# Patient Record
Sex: Male | Born: 1955 | Race: White | Hispanic: No | Marital: Married | Smoking: Never smoker
Health system: Southern US, Community
[De-identification: ages and names within clinical notes are randomized; demographics above are authoritative.]

## PROBLEM LIST (undated history)

## (undated) DIAGNOSIS — Z860100 Personal history of colon polyps, unspecified: Secondary | ICD-10-CM

## (undated) DIAGNOSIS — I10 Essential (primary) hypertension: Secondary | ICD-10-CM

## (undated) DIAGNOSIS — I4430 Unspecified atrioventricular block: Secondary | ICD-10-CM

## (undated) DIAGNOSIS — R001 Bradycardia, unspecified: Secondary | ICD-10-CM

## (undated) DIAGNOSIS — Z950 Presence of cardiac pacemaker: Secondary | ICD-10-CM

## (undated) DIAGNOSIS — N529 Male erectile dysfunction, unspecified: Secondary | ICD-10-CM

## (undated) DIAGNOSIS — E1169 Type 2 diabetes mellitus with other specified complication: Secondary | ICD-10-CM

## (undated) DIAGNOSIS — N410 Acute prostatitis: Secondary | ICD-10-CM

## (undated) DIAGNOSIS — T7840XA Allergy, unspecified, initial encounter: Secondary | ICD-10-CM

## (undated) DIAGNOSIS — E785 Hyperlipidemia, unspecified: Secondary | ICD-10-CM

## (undated) HISTORY — DX: Male erectile dysfunction, unspecified: N52.9

## (undated) HISTORY — DX: Acute prostatitis: N41.0

## (undated) HISTORY — DX: Hyperlipidemia, unspecified: E78.5

## (undated) HISTORY — DX: Bradycardia, unspecified: R00.1

## (undated) HISTORY — DX: Essential (primary) hypertension: I10

## (undated) HISTORY — DX: Allergy, unspecified, initial encounter: T78.40XA

## (undated) HISTORY — DX: Personal history of colonic polyps: Z86.010

## (undated) HISTORY — DX: Presence of cardiac pacemaker: Z95.0

## (undated) HISTORY — DX: Personal history of colon polyps, unspecified: Z86.0100

## (undated) HISTORY — DX: Unspecified atrioventricular block: I44.30

## (undated) HISTORY — PX: OTHER SURGICAL HISTORY: SHX169

---

## 2003-10-19 ENCOUNTER — Encounter: Admission: RE | Admit: 2003-10-19 | Discharge: 2003-11-02 | Payer: Self-pay | Admitting: Internal Medicine

## 2004-02-22 ENCOUNTER — Ambulatory Visit: Payer: Self-pay | Admitting: Internal Medicine

## 2004-06-20 ENCOUNTER — Ambulatory Visit: Payer: Self-pay | Admitting: Internal Medicine

## 2004-11-21 ENCOUNTER — Ambulatory Visit: Payer: Self-pay | Admitting: Internal Medicine

## 2005-05-23 ENCOUNTER — Ambulatory Visit: Payer: Self-pay | Admitting: Internal Medicine

## 2005-10-21 ENCOUNTER — Ambulatory Visit: Payer: Self-pay | Admitting: Internal Medicine

## 2006-02-17 ENCOUNTER — Ambulatory Visit: Payer: Self-pay | Admitting: Internal Medicine

## 2006-02-17 LAB — CONVERTED CEMR LAB: Hgb A1c MFr Bld: 6 % (ref 4.6–6.0)

## 2006-06-16 ENCOUNTER — Ambulatory Visit: Payer: Self-pay | Admitting: Internal Medicine

## 2006-11-07 DIAGNOSIS — J309 Allergic rhinitis, unspecified: Secondary | ICD-10-CM | POA: Insufficient documentation

## 2006-12-23 ENCOUNTER — Ambulatory Visit: Payer: Self-pay | Admitting: Internal Medicine

## 2006-12-23 DIAGNOSIS — E1169 Type 2 diabetes mellitus with other specified complication: Secondary | ICD-10-CM | POA: Insufficient documentation

## 2006-12-23 DIAGNOSIS — E119 Type 2 diabetes mellitus without complications: Secondary | ICD-10-CM | POA: Insufficient documentation

## 2006-12-23 DIAGNOSIS — I10 Essential (primary) hypertension: Secondary | ICD-10-CM | POA: Insufficient documentation

## 2006-12-23 DIAGNOSIS — I152 Hypertension secondary to endocrine disorders: Secondary | ICD-10-CM | POA: Insufficient documentation

## 2006-12-23 DIAGNOSIS — E1159 Type 2 diabetes mellitus with other circulatory complications: Secondary | ICD-10-CM | POA: Insufficient documentation

## 2006-12-23 LAB — CONVERTED CEMR LAB: Hgb A1c MFr Bld: 6.6 % — ABNORMAL HIGH

## 2007-04-21 ENCOUNTER — Ambulatory Visit: Payer: Self-pay | Admitting: Internal Medicine

## 2007-04-21 LAB — CONVERTED CEMR LAB: Hgb A1c MFr Bld: 6.8 % — ABNORMAL HIGH (ref 4.6–6.0)

## 2007-05-15 ENCOUNTER — Telehealth: Payer: Self-pay | Admitting: Internal Medicine

## 2007-08-17 ENCOUNTER — Ambulatory Visit: Payer: Self-pay | Admitting: Internal Medicine

## 2007-08-17 LAB — CONVERTED CEMR LAB
ALT: 18 units/L (ref 0–53)
AST: 16 units/L (ref 0–37)
Albumin: 3.8 g/dL (ref 3.5–5.2)
Alkaline Phosphatase: 38 units/L — ABNORMAL LOW (ref 39–117)
BUN: 25 mg/dL — ABNORMAL HIGH (ref 6–23)
Basophils Absolute: 0 10*3/uL (ref 0.0–0.1)
Basophils Relative: 0.2 % (ref 0.0–1.0)
Bilirubin Urine: NEGATIVE
Bilirubin, Direct: 0.1 mg/dL (ref 0.0–0.3)
CO2: 29 meq/L (ref 19–32)
Calcium: 9.2 mg/dL (ref 8.4–10.5)
Chloride: 107 meq/L (ref 96–112)
Cholesterol: 137 mg/dL (ref 0–200)
Creatinine, Ser: 1 mg/dL (ref 0.4–1.5)
Creatinine,U: 191.5 mg/dL
Eosinophils Absolute: 0.3 10*3/uL (ref 0.0–0.7)
Eosinophils Relative: 5.4 % — ABNORMAL HIGH (ref 0.0–5.0)
GFR calc Af Amer: 101 mL/min
GFR calc non Af Amer: 83 mL/min
Glucose, Bld: 138 mg/dL — ABNORMAL HIGH (ref 70–99)
Glucose, Urine, Semiquant: NEGATIVE
HCT: 39.7 % (ref 39.0–52.0)
HDL: 32 mg/dL — ABNORMAL LOW (ref >39.0)
Hemoglobin: 13.5 g/dL (ref 13.0–17.0)
Hgb A1c MFr Bld: 6.4 % — ABNORMAL HIGH (ref 4.6–6.0)
Ketones, urine, test strip: NEGATIVE
LDL Cholesterol: 94 mg/dL (ref 0–99)
Lymphocytes Relative: 25.3 % (ref 12.0–46.0)
MCHC: 33.9 g/dL (ref 30.0–36.0)
MCV: 92 fL (ref 78.0–100.0)
Microalb Creat Ratio: 7.8 mg/g (ref 0.0–30.0)
Microalb, Ur: 1.5 mg/dL (ref 0.0–1.9)
Monocytes Absolute: 0.4 10*3/uL (ref 0.1–1.0)
Monocytes Relative: 8.3 % (ref 3.0–12.0)
Neutro Abs: 3 10*3/uL (ref 1.4–7.7)
Neutrophils Relative %: 60.8 % (ref 43.0–77.0)
Nitrite: NEGATIVE
PSA: 0.3 ng/mL (ref 0.10–4.00)
Platelets: 252 10*3/uL (ref 150–400)
Potassium: 4.4 meq/L (ref 3.5–5.1)
Protein, U semiquant: NEGATIVE
RBC: 4.31 M/uL (ref 4.22–5.81)
RDW: 12.5 % (ref 11.5–14.6)
Sodium: 141 meq/L (ref 135–145)
Specific Gravity, Urine: 1.025
TSH: 1.36 microintl units/mL (ref 0.35–5.50)
Total Bilirubin: 0.8 mg/dL (ref 0.3–1.2)
Total CHOL/HDL Ratio: 4.3
Total Protein: 6.8 g/dL (ref 6.0–8.3)
Triglycerides: 53 mg/dL (ref 0–149)
Urobilinogen, UA: 0.2
VLDL: 11 mg/dL (ref 0–40)
WBC Urine, dipstick: NEGATIVE
WBC: 5 10*3/uL (ref 4.5–10.5)
pH: 5

## 2007-08-24 ENCOUNTER — Ambulatory Visit: Payer: Self-pay | Admitting: Internal Medicine

## 2007-09-10 ENCOUNTER — Encounter: Payer: Self-pay | Admitting: Internal Medicine

## 2007-10-12 ENCOUNTER — Ambulatory Visit: Payer: Self-pay | Admitting: Gastroenterology

## 2007-10-23 ENCOUNTER — Ambulatory Visit: Payer: Self-pay | Admitting: Gastroenterology

## 2007-10-23 ENCOUNTER — Encounter: Payer: Self-pay | Admitting: Gastroenterology

## 2007-10-23 LAB — HM COLONOSCOPY

## 2007-10-27 ENCOUNTER — Encounter: Payer: Self-pay | Admitting: Gastroenterology

## 2008-01-18 ENCOUNTER — Ambulatory Visit: Payer: Self-pay | Admitting: Internal Medicine

## 2008-01-18 DIAGNOSIS — Z86010 Personal history of colonic polyps: Secondary | ICD-10-CM | POA: Insufficient documentation

## 2008-01-18 LAB — CONVERTED CEMR LAB: Hgb A1c MFr Bld: 6.4 % — ABNORMAL HIGH (ref 4.6–6.0)

## 2008-08-19 ENCOUNTER — Ambulatory Visit: Payer: Self-pay | Admitting: Internal Medicine

## 2008-08-19 LAB — CONVERTED CEMR LAB
ALT: 17 units/L (ref 0–53)
AST: 15 units/L (ref 0–37)
Albumin: 3.9 g/dL (ref 3.5–5.2)
Alkaline Phosphatase: 42 units/L (ref 39–117)
BUN: 19 mg/dL (ref 6–23)
Basophils Absolute: 0 10*3/uL (ref 0.0–0.1)
Basophils Relative: 0.6 % (ref 0.0–3.0)
Bilirubin Urine: NEGATIVE
Bilirubin, Direct: 0.1 mg/dL (ref 0.0–0.3)
Blood in Urine, dipstick: NEGATIVE
CO2: 29 meq/L (ref 19–32)
Calcium: 9.2 mg/dL (ref 8.4–10.5)
Chloride: 109 meq/L (ref 96–112)
Cholesterol: 163 mg/dL (ref 0–200)
Creatinine, Ser: 1 mg/dL (ref 0.4–1.5)
Creatinine,U: 224.3 mg/dL
Eosinophils Absolute: 0.3 10*3/uL (ref 0.0–0.7)
Eosinophils Relative: 6.8 % — ABNORMAL HIGH (ref 0.0–5.0)
GFR calc non Af Amer: 83.03 mL/min (ref >60)
Glucose, Bld: 148 mg/dL — ABNORMAL HIGH (ref 70–99)
Glucose, Urine, Semiquant: NEGATIVE
HCT: 40.6 % (ref 39.0–52.0)
HDL: 41 mg/dL (ref >39.00)
Hemoglobin: 14.2 g/dL (ref 13.0–17.0)
Hgb A1c MFr Bld: 6.6 % — ABNORMAL HIGH (ref 4.6–6.5)
Ketones, urine, test strip: NEGATIVE
LDL Cholesterol: 111 mg/dL — ABNORMAL HIGH (ref 0–99)
Lymphocytes Relative: 21.6 % (ref 12.0–46.0)
Lymphs Abs: 1 10*3/uL (ref 0.7–4.0)
MCHC: 35 g/dL (ref 30.0–36.0)
MCV: 90 fL (ref 78.0–100.0)
Microalb Creat Ratio: 4.5 mg/g (ref 0.0–30.0)
Microalb, Ur: 1 mg/dL (ref 0.0–1.9)
Monocytes Absolute: 0.4 10*3/uL (ref 0.1–1.0)
Monocytes Relative: 8 % (ref 3.0–12.0)
Neutro Abs: 3.1 10*3/uL (ref 1.4–7.7)
Neutrophils Relative %: 63 % (ref 43.0–77.0)
Nitrite: NEGATIVE
PSA: 0.29 ng/mL (ref 0.10–4.00)
Platelets: 221 10*3/uL (ref 150.0–400.0)
Potassium: 4.2 meq/L (ref 3.5–5.1)
RBC: 4.51 M/uL (ref 4.22–5.81)
RDW: 12.5 % (ref 11.5–14.6)
Sodium: 144 meq/L (ref 135–145)
Specific Gravity, Urine: 1.025
TSH: 1.52 microintl units/mL (ref 0.35–5.50)
Total Bilirubin: 0.8 mg/dL (ref 0.3–1.2)
Total CHOL/HDL Ratio: 4
Total Protein: 6.7 g/dL (ref 6.0–8.3)
Triglycerides: 56 mg/dL (ref 0.0–149.0)
Urobilinogen, UA: 0.2
VLDL: 11.2 mg/dL (ref 0.0–40.0)
WBC Urine, dipstick: NEGATIVE
WBC: 4.8 10*3/uL (ref 4.5–10.5)
pH: 5.5

## 2008-08-25 ENCOUNTER — Ambulatory Visit: Payer: Self-pay | Admitting: Internal Medicine

## 2008-09-13 ENCOUNTER — Encounter: Payer: Self-pay | Admitting: Internal Medicine

## 2008-11-28 ENCOUNTER — Ambulatory Visit: Payer: Self-pay | Admitting: Internal Medicine

## 2008-11-28 DIAGNOSIS — H60339 Swimmer's ear, unspecified ear: Secondary | ICD-10-CM | POA: Insufficient documentation

## 2009-02-27 ENCOUNTER — Ambulatory Visit: Payer: Self-pay | Admitting: Internal Medicine

## 2009-02-27 LAB — CONVERTED CEMR LAB: Hgb A1c MFr Bld: 6.7 % — ABNORMAL HIGH (ref 4.6–6.5)

## 2009-08-28 ENCOUNTER — Ambulatory Visit: Payer: Self-pay | Admitting: Internal Medicine

## 2009-08-28 LAB — CONVERTED CEMR LAB: Hgb A1c MFr Bld: 6.7 % — ABNORMAL HIGH (ref 4.6–6.5)

## 2009-09-18 ENCOUNTER — Encounter: Payer: Self-pay | Admitting: Internal Medicine

## 2009-11-14 ENCOUNTER — Ambulatory Visit: Payer: Self-pay | Admitting: Internal Medicine

## 2009-11-14 DIAGNOSIS — A088 Other specified intestinal infections: Secondary | ICD-10-CM | POA: Insufficient documentation

## 2009-12-02 ENCOUNTER — Ambulatory Visit: Payer: Self-pay | Admitting: Internal Medicine

## 2009-12-02 DIAGNOSIS — N410 Acute prostatitis: Secondary | ICD-10-CM

## 2009-12-02 HISTORY — DX: Acute prostatitis: N41.0

## 2009-12-04 ENCOUNTER — Telehealth: Payer: Self-pay | Admitting: Internal Medicine

## 2009-12-04 ENCOUNTER — Ambulatory Visit: Payer: Self-pay | Admitting: Internal Medicine

## 2009-12-04 DIAGNOSIS — K625 Hemorrhage of anus and rectum: Secondary | ICD-10-CM | POA: Insufficient documentation

## 2009-12-04 LAB — CONVERTED CEMR LAB: Blood Glucose, Fingerstick: 235

## 2010-02-20 ENCOUNTER — Ambulatory Visit: Payer: Self-pay | Admitting: Internal Medicine

## 2010-02-20 LAB — CONVERTED CEMR LAB
ALT: 16 units/L (ref 0–53)
AST: 16 units/L (ref 0–37)
Albumin: 3.9 g/dL (ref 3.5–5.2)
Alkaline Phosphatase: 48 units/L (ref 39–117)
BUN: 23 mg/dL (ref 6–23)
Basophils Absolute: 0 10*3/uL (ref 0.0–0.1)
Basophils Relative: 0.5 % (ref 0.0–3.0)
Bilirubin Urine: NEGATIVE
Bilirubin, Direct: 0.1 mg/dL (ref 0.0–0.3)
Blood in Urine, dipstick: NEGATIVE
CO2: 28 meq/L (ref 19–32)
Calcium: 9.2 mg/dL (ref 8.4–10.5)
Chloride: 106 meq/L (ref 96–112)
Cholesterol: 165 mg/dL (ref 0–200)
Creatinine, Ser: 0.9 mg/dL (ref 0.4–1.5)
Creatinine,U: 206.5 mg/dL
Eosinophils Absolute: 0.1 10*3/uL (ref 0.0–0.7)
Eosinophils Relative: 2 % (ref 0.0–5.0)
GFR calc non Af Amer: 93.24 mL/min (ref >60)
Glucose, Bld: 180 mg/dL — ABNORMAL HIGH (ref 70–99)
Glucose, Urine, Semiquant: NEGATIVE
HCT: 41.8 % (ref 39.0–52.0)
HDL: 43.9 mg/dL (ref >39.00)
Hemoglobin: 14.3 g/dL (ref 13.0–17.0)
Hgb A1c MFr Bld: 7.2 % — ABNORMAL HIGH (ref 4.6–6.5)
Ketones, urine, test strip: NEGATIVE
LDL Cholesterol: 112 mg/dL — ABNORMAL HIGH (ref 0–99)
Lymphocytes Relative: 15.6 % (ref 12.0–46.0)
Lymphs Abs: 1 10*3/uL (ref 0.7–4.0)
MCHC: 34.3 g/dL (ref 30.0–36.0)
MCV: 89.9 fL (ref 78.0–100.0)
Microalb Creat Ratio: 0.9 mg/g (ref 0.0–30.0)
Microalb, Ur: 1.8 mg/dL (ref 0.0–1.9)
Monocytes Absolute: 0.5 10*3/uL (ref 0.1–1.0)
Monocytes Relative: 7.7 % (ref 3.0–12.0)
Neutro Abs: 4.9 10*3/uL (ref 1.4–7.7)
Neutrophils Relative %: 74.2 % (ref 43.0–77.0)
Nitrite: NEGATIVE
PSA: 0.35 ng/mL (ref 0.10–4.00)
Platelets: 225 10*3/uL (ref 150.0–400.0)
Potassium: 4.9 meq/L (ref 3.5–5.1)
Protein, U semiquant: NEGATIVE
RBC: 4.65 M/uL (ref 4.22–5.81)
RDW: 13.8 % (ref 11.5–14.6)
Sodium: 141 meq/L (ref 135–145)
Specific Gravity, Urine: >=1.03
TSH: 1.48 microintl units/mL (ref 0.35–5.50)
Total Bilirubin: 0.3 mg/dL (ref 0.3–1.2)
Total CHOL/HDL Ratio: 4
Total Protein: 6.4 g/dL (ref 6.0–8.3)
Triglycerides: 44 mg/dL (ref 0.0–149.0)
Urobilinogen, UA: 0.2
VLDL: 8.8 mg/dL (ref 0.0–40.0)
WBC Urine, dipstick: NEGATIVE
WBC: 6.6 10*3/uL (ref 4.5–10.5)
pH: 5

## 2010-02-27 ENCOUNTER — Telehealth: Payer: Self-pay | Admitting: Internal Medicine

## 2010-02-27 ENCOUNTER — Ambulatory Visit: Payer: Self-pay | Admitting: Internal Medicine

## 2010-02-27 LAB — HM DIABETES FOOT EXAM

## 2010-02-27 LAB — HM DIABETES EYE EXAM: HM Diabetic Eye Exam: NORMAL

## 2010-05-16 NOTE — Procedures (Signed)
Summary: Colonoscopy   Colonoscopy  Procedure date:  10/23/2007  Findings:      Location:  [facility].    Procedures Next Due Date:    Colonoscopy: 10/2012  Patient Name: Justin [name], Justin [name] MRN:  Procedure Procedures: Colonoscopy CPT: 506 552 0118.    with polypectomy. CPT: A3573898.  Personnel: Endoscopist: Rachael Fee, MD.  Referred By: Gordy Savers, MD.  Exam Location: Exam performed in Endoscopy Suite. Outpatient  Patient Consent: Procedure, Alternatives, Risks and Benefits discussed, consent obtained, from patient. Consent was obtained by the RN.  Indications  Average Risk Screening Routine.  History  Current Medications: Patient is not currently taking Coumadin.  Comments: Patient history reviewed and updated, pre-procedure physical performed prior to initiation of sedation? yes Pre-Exam Physical: Performed Oct 23, 2007. Cardio-pulmonary exam, Abdominal exam, Mental status exam WNL.  Exam Exam: Extent of exam reached: Cecum, extent intended: Cecum.  The cecum was identified by appendiceal orifice and IC valve. Patient position: on left side. Time to Cecum: 00:02:28. Time for Withdrawl: 00:12:53. Colon retroflexion performed. Images taken. ASA Classification: II. Tolerance: good.  Monitoring: Pulse and BP monitoring, Oximetry used. Supplemental O2 given.  Colon Prep Prep results: good.  Sedation Meds: Patient assessed and found to be appropriate for moderate (conscious) sedation. Fentanyl 50 mcg. given IV. Versed 7 mg. given IV.  Findings POLYP: Descending Colon, Maximum size: 4 mm. sessile polyp. Procedure:  snare without cautery, removed, retrieved, Polyp sent to pathology. Path # 1.  - NORMAL EXAM: Cecum to Rectum. Comments: OTHERWISE NORMAL EXAMINATION.   Assessment Abnormal examination, see findings above.  Comments: SINGLE SMALL COLON POLYP, NO CANCERS.  IF THE POLYP IS A PRE-CANCEROUS POLYP (ADENOMA), A  COLONOSCOPY SHOULD BE REPEATED IN 5 YEARS.  OTHERWISE THE PATIENT SHOULD CONTINUE TO FOLLOW CURRENT COLORECTAL CANCER SCREENING GUIDELINES WITH A REPEAT COLONOSCOPY IN 10 YEARS. Events  Unplanned Interventions: No intervention was required.  Unplanned Events: There were no complications.    cc.   Tori Milks   REPORT OF SURGICAL PATHOLOGY   Case #: [id number] Patient Name: Justin [name], Justin [name]. Office Chart Number:  [id number]   MRN: [id number] Pathologist: Orlandis Sanden. Luisa Hart, MD DOB/Age  24-Jun-1955 (Age: 55)    Gender: M Date Taken:  10/23/2007 Date Received: 10/23/2007   FINAL DIAGNOSIS   ***MICROSCOPIC EXAMINATION AND DIAGNOSIS***   COLON, DESCENDING POLYP:   - TUBULAR ADENOMA.  NO HIGH GRADE DYSPLASIA OR MALIGNANCY IDENTIFIED.   cc Date Reported:  10/26/2007     Beulah Gandy. Luisa Hart, MD *** Electronically Signed Out By JDP ***   Clinical information Screening, R/O adenoma (kv)   specimen(s) obtained Colon, polyp(s), descending   Gross Description Received in formalin is a tan, soft tissue fragment that is submitted in toto.  Size:  0.4 cm  (SP:mw, 10/23/07)    mw/     Signed by Rachael Fee MD on 10/27/2007 at 2:27 PM  ________________________________________________________________________ recall colonoscopy july, 2014   Signed by Rachael Fee MD on 10/27/2007 at 2:27 PM  ________________________________________________________________________    [facility] [address] [place], [place]  [address]    Dear Justin [name],   The polyp(s) that were removed during your recent procedure were proven to be adenomas.  These are pre-cancerous polyps that may have grown into cancers if they had not been removed.  Current colon polyp surveillance guidelines recommend that you have a repeat colonoscopy in 5 years.  We will therefore put your information in our reminder system  and will contact you in 5 years to schedule a repeat procedure.   Please call if you have any questions or concerns.      Sincerely,  Rachael Fee MD  This letter has been electronically signed by your physician.   Signed by Rachael Fee MD on 10/27/2007 at 2:29 PM   This report was created from the original endoscopy report, which was reviewed and signed by the above listed endoscopist.   ________________________________________________________________________

## 2010-05-16 NOTE — Assessment & Plan Note (Signed)
Summary: cpx/njr [facility] WITH PT FROM [facility]   Vital Signs:  Patient profile:   55 year old male Height:      72 inches Weight:      184 pounds BMI:     25.05 Pulse rate:   96 / minute Pulse rhythm:   regular BP sitting:   136 / 62  (left arm) Cuff size:   regular  Vitals Entered By: Raechel Ache, RN (Aug 25, 2008 2:23 PM)  History of Present Illness: 55 year old patient seen today for a wellness exam.  Medical problems  include type 2 diabetes, colonic polyps, and history of allergic rhinitis.  he has treated hypertension.  Clinically, he is doing well.  No concerns or complaints  Problems Prior to Update: 1)  Colonic Polyps, Hx of  (ICD-V12.72) 2)  Physical Examination  (ICD-V70.0) 3)  Diabetes Mellitus, Type II  (ICD-250.00) 4)  Hypertension  (ICD-401.9) 5)  Allergic Rhinitis  (ICD-477.9) 6)  Family History Diabetes 1st Degree Relative  (ICD-V18.0)  Medications Prior to Update: 1)  Viagra 50 Mg Tabs (Sildenafil Citrate) 2)  Metformin Hcl 1000 Mg Tabs (Metformin Hcl) .... Take 1 Tablet By Mouth Two Times A Day 3)  Cialis 20 Mg Tabs (Tadalafil) .... As Needed 4)  Benazepril-Hydrochlorothiazide 20-12.5 Mg  Tabs (Benazepril-Hydrochlorothiazide) .... One Daily 5)  Amlodipine Besylate 5 Mg  Tabs (Amlodipine Besylate) .... One Daily  Allergies: No Known Drug Allergies  Past History:  Past Medical History:    Reviewed history from 01/18/2008 and no changes required:    Allergic rhinitis    Hypertension    Diabetes mellitus, type II    Colonic polyps, hx of  Past Surgical History:    Reviewed history from 01/18/2008 and no changes required:    Denies surgical history    colonoscopy 2009  Family History:    Reviewed history from 04/21/2007 and no changes required:       Family History Hypertension       Family History of Stroke F 1st degree relative <55       Family History Diabetes 1st degree relative       father status post CABG at age 50; dementia        mother  status post stroke.  History hypertension              One brother history type 2 diabetes and hypertension       one sister is well- HTN  Social History:    Reviewed history from 08/24/2007 and no changes required:       Alcohol use-no       Drug use-no       Married        two twin  daughters with  CP; wheelchair bound  Review of Systems  The patient denies anorexia, fever, weight loss, weight gain, vision loss, decreased hearing, hoarseness, chest pain, syncope, dyspnea on exertion, peripheral edema, prolonged cough, headaches, hemoptysis, abdominal pain, melena, hematochezia, severe indigestion/heartburn, hematuria, incontinence, genital sores, muscle weakness, suspicious skin lesions, transient blindness, difficulty walking, depression, unusual weight change, abnormal bleeding, enlarged lymph nodes, angioedema, breast masses, and testicular masses.    Physical Exam  General:  mildly overweight.  Blood pressure 134/78 Head:  Normocephalic and atraumatic without obvious abnormalities. No apparent alopecia or balding. Eyes:  No corneal or conjunctival inflammation noted. EOMI. Perrla. Funduscopic exam benign, without hemorrhages, exudates or papilledema. Vision grossly normal. Ears:  External ear exam shows no significant lesions or  deformities.  Otoscopic examination reveals clear canals, tympanic membranes are intact bilaterally without bulging, retraction, inflammation or discharge. Hearing is grossly normal bilaterally.  Diabetes Management Exam:    Foot Exam (with socks and/or shoes not present):       Sensory-Pinprick/Light touch:          Left medial foot (L-4): normal          Left dorsal foot (L-5): normal          Left lateral foot (S-1): normal       Sensory-Monofilament:          Left foot: normal       Inspection:          Left foot: normal       Nails:          Left foot: normal          Right foot: normal    Eye Exam:       Eye Exam done here today           Results: normal   Impression & Recommendations:  Problem # 1:  Preventive Health Care (ICD-V70.0)  Complete Medication List: 1)  Viagra 50 Mg Tabs (Sildenafil citrate) 2)  Metformin Hcl 1000 Mg Tabs (Metformin hcl) .... Take 1 tablet by mouth two times a day 3)  Benazepril-hydrochlorothiazide 20-12.5 Mg Tabs (Benazepril-hydrochlorothiazide) .... One daily 4)  Amlodipine Besylate 5 Mg Tabs (Amlodipine besylate) .... One daily  Patient Instructions: 1)  Please schedule a follow-up appointment in 6 months. 2)  It is important that you exercise regularly at least 20 minutes 5 times a week. If you develop chest pain, have severe difficulty breathing, or feel very tired , stop exercising immediately and seek medical attention. 3)  You need to lose weight. Consider a lower calorie diet and regular exercise.  4)  Check your blood sugars regularly. If your readings are usually above : or below 70 you should contact our office. 5)  It is important that your Diabetic A1c level is checked every 3 months. 6)  See your eye doctor yearly to check for diabetic eye damage. 7)  Check your Blood Pressure regularly. If it is above: 130/80 you should make an appointment. Prescriptions: AMLODIPINE BESYLATE 5 MG  TABS (AMLODIPINE BESYLATE) one daily  #90 x 6   Entered and Authorized by:   Gordy Savers  MD   Signed by:   Gordy Savers  MD on 08/25/2008   Method used:   Print then Give to Patient   RxID:   [id number] BENAZEPRIL-HYDROCHLOROTHIAZIDE 20-12.5 MG  TABS (BENAZEPRIL-HYDROCHLOROTHIAZIDE) one daily  #90 x 6   Entered and Authorized by:   Gordy Savers  MD   Signed by:   Gordy Savers  MD on 08/25/2008   Method used:   Print then Give to Patient   RxID:   [id number] METFORMIN HCL 1000 MG TABS (METFORMIN HCL) Take 1 tablet by mouth two times a day  #180 x 6   Entered and Authorized by:   Gordy Savers  MD   Signed by:   Gordy Savers  MD on 08/25/2008    Method used:   Print then Give to Patient   RxID:   [id number] VIAGRA 50 MG TABS (SILDENAFIL CITRATE)   #12 x 6   Entered and Authorized by:   Gordy Savers  MD   Signed by:   Gordy Savers  MD on 08/25/2008  Method used:   Print then Give to Patient   RxID:   [id number]

## 2010-05-16 NOTE — Assessment & Plan Note (Signed)
Summary: cpx/jls   Vital Signs:  Patient Profile:   55 Years Old Male Weight:      183 pounds Temp:     97.8 degrees F oral BP sitting:   132 / 54  (left arm) Cuff size:   regular  Vitals Entered By: Raechel Ache, RN (Aug 24, 2007 2:09 PM)                 Chief Complaint:  CPX and labs done. FBS 145 today.Marland Kitchen  History of Present Illness: 55 year old patient seen today for and annual  exam.  He has a history of mild hypertension and type 2 diabetes.  No concerns or complaints    Current Allergies: No known allergies   Past Medical History:    Reviewed history from 04/21/2007 and no changes required:       Allergic rhinitis       Hypertension       Diabetes mellitus, type II  Past Surgical History:    Reviewed history from 12/23/2006 and no changes required:       Denies surgical history   Family History:    Reviewed history from 04/21/2007 and no changes required:       Family History Hypertension       Family History of Stroke F 1st degree relative <60       Family History Diabetes 1st degree relative       father status post CABG at age 72       mother status post stroke.  History hypertension              One brother history type 2 diabetes and hypertension       one sister is well  Social History:    Alcohol use-no    Drug use-no    Married     two twin  daughters with  CP; wheelchair bound    Review of Systems  The patient denies anorexia, fever, weight loss, weight gain, vision loss, decreased hearing, hoarseness, chest pain, syncope, dyspnea on exhertion, peripheral edema, prolonged cough, hemoptysis, abdominal pain, melena, hematochezia, severe indigestion/heartburn, hematuria, incontinence, genital sores, muscle weakness, suspicious skin lesions, transient blindness, difficulty walking, depression, unusual weight change, abnormal bleeding, enlarged lymph nodes, angioedema, breast masses, and testicular masses.     Physical Exam  General:  Well-developed,well-nourished,in no acute distress; alert,appropriate and cooperative throughout examination Head:     Normocephalic and atraumatic without obvious abnormalities. No apparent alopecia or balding. Eyes:     No corneal or conjunctival inflammation noted. EOMI. Perrla. Funduscopic exam benign, without hemorrhages, exudates or papilledema. Vision grossly normal. Ears:     External ear exam shows no significant lesions or deformities.  Otoscopic examination reveals clear canals, tympanic membranes are intact bilaterally without bulging, retraction, inflammation or discharge. Hearing is grossly normal bilaterally. Mouth:     Oral mucosa and oropharynx without lesions or exudates.  Teeth in good repair. Neck:     No deformities, masses, or tenderness noted. Chest Wall:     No deformities, masses, tenderness or gynecomastia noted. Lungs:     Normal respiratory effort, chest expands symmetrically. Lungs are clear to auscultation, no crackles or wheezes. Heart:     Normal rate and regular rhythm. S1 and S2 normal without gallop, murmur, click, rub or other extra sounds. Abdomen:     Bowel sounds positive,abdomen soft and non-tender without masses, organomegaly or hernias noted. Rectal:     No external abnormalities noted. Normal  sphincter tone. No rectal masses or tenderness. Genitalia:     Testes bilaterally descended without nodularity, tenderness or masses. No scrotal masses or lesions. No penis lesions or urethral discharge. Prostate:     Prostate gland firm and smooth, no enlargement, nodularity, tenderness, mass, asymmetry or induration. Msk:     No deformity or scoliosis noted of thoracic or lumbar spine.   Pulses:     R and L carotid,radial,femoral,dorsalis pedis and posterior tibial pulses are full and equal bilaterally Extremities:     No clubbing, cyanosis, edema, or deformity noted with normal full range of motion of all joints.   Neurologic:     No cranial nerve  deficits noted. Station and gait are normal. Plantar reflexes are down-going bilaterally. DTRs are symmetrical throughout. Sensory, motor and coordinative functions appear intact. Skin:     Intact without suspicious lesions or rashes Cervical Nodes:     No lymphadenopathy noted Axillary Nodes:     No palpable lymphadenopathy Inguinal Nodes:     No significant adenopathy Psych:     Cognition and judgment appear intact. Alert and cooperative with normal attention span and concentration. No apparent delusions, illusions, hallucinations    Impression & Recommendations:  Problem # 1:  DIABETES MELLITUS, TYPE II (ICD-250.00)  His updated medication list for this problem includes:    Metformin Hcl 1000 Mg Tabs (Metformin hcl) .Marland Kitchen... Take 1 tablet by mouth two times a day    Benazepril-hydrochlorothiazide 20-12.5 Mg Tabs (Benazepril-hydrochlorothiazide) ..... One daily   Problem # 2:  HYPERTENSION (ICD-401.9)  His updated medication list for this problem includes:    Benazepril-hydrochlorothiazide 20-12.5 Mg Tabs (Benazepril-hydrochlorothiazide) ..... One daily    Amlodipine Besylate 5 Mg Tabs (Amlodipine besylate) ..... One daily   Problem # 3:  ALLERGIC RHINITIS (ICD-477.9)  Problem # 4:  h/o hematuria  UA was repeated today that revealed an no blood.  The entire dipstick was negative  Complete Medication List: 1)  Viagra 50 Mg Tabs (Sildenafil citrate) 2)  Metformin Hcl 1000 Mg Tabs (Metformin hcl) .... Take 1 tablet by mouth two times a day 3)  Cialis 20 Mg Tabs (Tadalafil) .... As needed 4)  Benazepril-hydrochlorothiazide 20-12.5 Mg Tabs (Benazepril-hydrochlorothiazide) .... One daily 5)  Amlodipine Besylate 5 Mg Tabs (Amlodipine besylate) .... One daily  Other Orders: Gastroenterology Referral (GI)   Patient Instructions: 1)  Please schedule a follow-up appointment in 4 months. 2)  Limit your Sodium (Salt) to less than 2 grams a day(slightly less than 1/2 a teaspoon) to  prevent fluid retention, swelling, or worsening of symptoms. 3)  It is important that you exercise regularly at least 20 minutes 5 times a week. If you develop chest pain, have severe difficulty breathing, or feel very tired , stop exercising immediately and seek medical attention. 4)  Schedule a colonoscopy/sigmoidoscopy to help detect colon cancer. 5)  Check your blood sugars regularly. If your readings are usually above : or below 70 you should contact our office. 6)  It is important that your Diabetic A1c level is checked every 3 months. 7)  See your eye doctor yearly to check for diabetic eye damage.   Prescriptions: AMLODIPINE BESYLATE 5 MG  TABS (AMLODIPINE BESYLATE) one daily  #90 x 6   Entered and Authorized by:   Gordy Savers  MD   Signed by:   Gordy Savers  MD on 08/24/2007   Method used:   Print then Give to Patient   RxID:   [id number]  BENAZEPRIL-HYDROCHLOROTHIAZIDE 20-12.5 MG  TABS (BENAZEPRIL-HYDROCHLOROTHIAZIDE) one daily  #90 x 6   Entered and Authorized by:   Gordy Savers  MD   Signed by:   Gordy Savers  MD on 08/24/2007   Method used:   Print then Give to Patient   RxID:   [id number] METFORMIN HCL 1000 MG TABS (METFORMIN HCL) Take 1 tablet by mouth two times a day  #180 x 6   Entered and Authorized by:   Gordy Savers  MD   Signed by:   Gordy Savers  MD on 08/24/2007   Method used:   Print then Give to Patient   RxID:   [id number] CIALIS 20 MG TABS (TADALAFIL) as needed  #12 x 0   Entered and Authorized by:   Gordy Savers  MD   Signed by:   Gordy Savers  MD on 08/24/2007   Method used:   Print then Give to Patient   RxID:   [id number]  ]

## 2010-05-16 NOTE — Assessment & Plan Note (Signed)
Summary: M6A//[facility]   Vital Signs:  Patient Profile:   55 Years Old Male Weight:      188 pounds (85.45 kg) Temp:     98.0 degrees F (36.67 degrees C) oral BP sitting:   162 / 82  (left arm)  Vitals Entered By: Sindy Guadeloupe (December 23, 2006 1:15 PM)                 Chief Complaint:  f/u on diabetes bs 125 yesterday.  History of Present Illness: 55 year old patient.  Follow-up hypertension and diabetes, doing well.  Blood sugars remain under nice control.  He is on HCTZ for blood pressure control and more recently readings have been higher  Current Allergies: No known allergies   Past Medical History:    Allergic rhinitis    Hypertension  Past Surgical History:    Denies surgical history      Physical Exam  General:     Well-developed,well-nourished,in no acute distress; alert,appropriate and cooperative throughout examination for pressure 160/84 Head:     Normocephalic and atraumatic without obvious abnormalities. No apparent alopecia or balding. Eyes:     No corneal or conjunctival inflammation noted. EOMI. Perrla. Funduscopic exam benign, without hemorrhages, exudates or papilledema. Vision grossly normal. Mouth:     Oral mucosa and oropharynx without lesions or exudates.  Teeth in good repair. Neck:     No deformities, masses, or tenderness noted. Lungs:     Normal respiratory effort, chest expands symmetrically. Lungs are clear to auscultation, no crackles or wheezes. Heart:     Normal rate and regular rhythm. S1 and S2 normal without gallop, murmur, click, rub or other extra sounds. Abdomen:     Bowel sounds positive,abdomen soft and non-tender without masses, organomegaly or hernias noted.    Impression & Recommendations:  Problem # 1:  HYPERTENSION (ICD-401.9)  The following medications were removed from the medication list:    Hydrochlorothiazide 25 Mg Tabs (Hydrochlorothiazide) .Marland Kitchen... Take 1 tab by mouth every morning  His updated  medication list for this problem includes:    Benazepril-hydrochlorothiazide 20-12.5 Mg Tabs (Benazepril-hydrochlorothiazide) ..... One daily   Problem # 2:  DIABETES MELLITUS, TYPE II (ICD-250.00)  His updated medication list for this problem includes:    Metformin Hcl 1000 Mg Tabs (Metformin hcl) .Marland Kitchen... Take 1 tablet by mouth two times a day    Benazepril-hydrochlorothiazide 20-12.5 Mg Tabs (Benazepril-hydrochlorothiazide) ..... One daily  Orders: TLB-A1C / Hgb A1C (Glycohemoglobin) (83036-A1C) Venipuncture (91478)   Complete Medication List: 1)  Viagra 50 Mg Tabs (Sildenafil citrate) 2)  Metformin Hcl 1000 Mg Tabs (Metformin hcl) .... Take 1 tablet by mouth two times a day 3)  Cialis 20 Mg Tabs (Tadalafil) .... As needed 4)  Benazepril-hydrochlorothiazide 20-12.5 Mg Tabs (Benazepril-hydrochlorothiazide) .... One daily   Patient Instructions: 1)  Please schedule a follow-up appointment in 4 months. 2)  Limit your Sodium (Salt) to less than 4 grams a day (slightly less than 1 teaspoon) to prevent fluid retention, swelling, or worsening or symptoms. 3)  It is important that you exercise regularly at least 20 minutes 5 times a week. If you develop chest pain, have severe difficulty breathing, or feel very tired , stop exercising immediately and seek medical attention.    Prescriptions: CIALIS 20 MG TABS (TADALAFIL) as needed  #12 x 0   Entered and Authorized by:   Gordy Savers  MD   Signed by:   Gordy Savers  MD on 12/23/2006  Method used:   Print then Give to Patient   RxID:   [id number] BENAZEPRIL-HYDROCHLOROTHIAZIDE 20-12.5 MG  TABS (BENAZEPRIL-HYDROCHLOROTHIAZIDE) one daily  #90 x 6   Entered and Authorized by:   Gordy Savers  MD   Signed by:   Gordy Savers  MD on 12/23/2006   Method used:   Print then Give to Patient   RxID:   [id number] METFORMIN HCL 1000 MG TABS (METFORMIN HCL) Take 1 tablet by mouth two times a day  #180 x 6    Entered and Authorized by:   Gordy Savers  MD   Signed by:   Gordy Savers  MD on 12/23/2006   Method used:   Print then Give to Patient   RxID:   [id number]  ]

## 2010-05-16 NOTE — Assessment & Plan Note (Signed)
Summary: cpx/njr   Vital Signs:  Patient profile:   55 year old male Height:      71.5 inches Weight:      184 pounds Temp:     98.3 degrees F oral BP sitting:   120 / 70  (right arm) Cuff size:   regular  Vitals Entered By: Duard Brady LPN (February 27, 2010 2:00 PM) CC: cpx- doing well    BS 138 Is Patient Diabetic? Yes Did you bring your meter with you today? No   CC:  cpx- doing well    BS 138.  History of Present Illness: 75 -year-old patient who is seen today for a preventive health examination.  He has type 2 diabetes and treated hypertension.  He has a history of allergic rhinitis and colonic polyps.  He is doing quite well.  Preventive Screening-Counseling & Management  Alcohol-Tobacco     Smoking Status: never  Allergies (verified): No Known Drug Allergies  Past History:  Past Medical History: Reviewed history from 08/28/2009 and no changes required. Allergic rhinitis Hypertension Diabetes mellitus, type II Colonic polyps, hx of ED  Past Surgical History: Reviewed history from 01/18/2008 and no changes required. Denies surgical history colonoscopy 2009  Family History: Reviewed history from 02/27/2009 and no changes required. Family History Hypertension Family History of Stroke F 1st degree relative <60 Family History Diabetes 1st degree relative father status post CABG at age 51; dementia -died age 67 mother status post stroke.  History hypertension  One brother history type 2 diabetes and hypertension one sister is well- HTN  Social History: Reviewed history from 08/24/2007 and no changes required. Alcohol use-no Drug use-no Married  two twin  daughters with  CP; wheelchair boundSmoking Status:  never  Review of Systems  The patient denies anorexia, fever, weight loss, weight gain, vision loss, decreased hearing, hoarseness, chest pain, syncope, dyspnea on exertion, peripheral edema, prolonged cough, headaches, hemoptysis, abdominal  pain, melena, hematochezia, severe indigestion/heartburn, hematuria, incontinence, genital sores, muscle weakness, suspicious skin lesions, transient blindness, difficulty walking, depression, unusual weight change, abnormal bleeding, enlarged lymph nodes, angioedema, breast masses, and testicular masses.    Physical Exam  General:  Well-developed,well-nourished,in no acute distress; alert,appropriate and cooperative throughout examination Head:  Normocephalic and atraumatic without obvious abnormalities. No apparent alopecia or balding. Eyes:  No corneal or conjunctival inflammation noted. EOMI. Perrla. Funduscopic exam benign, without hemorrhages, exudates or papilledema. Vision grossly normal. Ears:  External ear exam shows no significant lesions or deformities.  Otoscopic examination reveals clear canals, tympanic membranes are intact bilaterally without bulging, retraction, inflammation or discharge. Hearing is grossly normal bilaterally. Nose:  External nasal examination shows no deformity or inflammation. Nasal mucosa are pink and moist without lesions or exudates. Mouth:  Oral mucosa and oropharynx without lesions or exudates.  Teeth in good repair. Neck:  No deformities, masses, or tenderness noted. Chest Wall:  No deformities, masses, tenderness or gynecomastia noted. Breasts:  No masses or gynecomastia noted Lungs:  Normal respiratory effort, chest expands symmetrically. Lungs are clear to auscultation, no crackles or wheezes. Heart:  Normal rate and regular rhythm. S1 and S2 normal without gallop, murmur, click, rub or other extra sounds. Abdomen:  Bowel sounds positive,abdomen soft and non-tender without masses, organomegaly or hernias noted. Genitalia:  Testes bilaterally descended without nodularity, tenderness or masses. No scrotal masses or lesions. No penis lesions or urethral discharge. Msk:  No deformity or scoliosis noted of thoracic or lumbar spine.   Pulses:  R and L  carotid,radial,femoral,dorsalis pedis and posterior tibial pulses are full and equal bilaterally Extremities:  No clubbing, cyanosis, edema, or deformity noted with normal full range of motion of all joints.   Neurologic:  No cranial nerve deficits noted. Station and gait are normal. Plantar reflexes are down-going bilaterally. DTRs are symmetrical throughout. Sensory, motor and coordinative functions appear intact. Skin:  Intact without suspicious lesions or rashes Cervical Nodes:  No lymphadenopathy noted Axillary Nodes:  No palpable lymphadenopathy Inguinal Nodes:  No significant adenopathy Psych:  Cognition and judgment appear intact. Alert and cooperative with normal attention span and concentration. No apparent delusions, illusions, hallucinations  Diabetes Management Exam:    Foot Exam (with socks and/or shoes not present):       Sensory-Pinprick/Light touch:          Left medial foot (L-4): normal          Left dorsal foot (L-5): normal          Left lateral foot (S-1): normal          Right medial foot (L-4): normal          Right dorsal foot (L-5): normal          Right lateral foot (S-1): normal       Sensory-Monofilament:          Left foot: normal          Right foot: normal       Inspection:          Left foot: normal          Right foot: normal       Nails:          Left foot: normal          Right foot: normal    Foot Exam by Podiatrist:       Date: 02/27/2010       Results: no diabetic findings       Done by: PCP    Eye Exam:       Eye Exam done here today          Results: normal   Impression & Recommendations:  Problem # 1:  PHYSICAL EXAMINATION (ICD-V70.0)  Complete Medication List: 1)  Metformin Hcl 1000 Mg Tabs (Metformin hcl) .... Take 1 tablet by mouth two times a day 2)  Benazepril-hydrochlorothiazide 20-12.5 Mg Tabs (Benazepril-hydrochlorothiazide) .... One daily 3)  Amlodipine Besylate 5 Mg Tabs (Amlodipine besylate) .... One daily 4)  Viagra 100  Mg Tabs (Sildenafil citrate) .... As directed 5)  Ciprofloxacin Hcl 500 Mg Tabs (Ciprofloxacin hcl) .Marland Kitchen.. 1 tab by mouth two times a day for prostate infection  Patient Instructions: 1)  Please schedule a follow-up appointment in 3 months. 2)  It is important that you exercise regularly at least 20 minutes 5 times a week. If you develop chest pain, have severe difficulty breathing, or feel very tired , stop exercising immediately and seek medical attention. 3)  Check your blood sugars regularly. If your readings are usually above : or below 70 you should contact our office. 4)  It is important that your Diabetic A1c level is checked every 3 months. Prescriptions: VIAGRA 100 MG TABS (SILDENAFIL CITRATE) as directed  #12 x 12   Entered and Authorized by:   Gordy Savers  MD   Signed by:   Gordy Savers  MD on 02/27/2010   Method used:   Electronically to  [facility]. [phone]* (retail)       [phone]       [place], [place]  [address]       Ph: [phone]       Fax: [phone]   RxID:   [id number] AMLODIPINE BESYLATE 5 MG  TABS (AMLODIPINE BESYLATE) one daily  #90 x 6   Entered and Authorized by:   Gordy Savers  MD   Signed by:   Gordy Savers  MD on 02/27/2010   Method used:   Electronically to        [facility]. [phone]* (retail)       [phone] [address]       [place], [place]  [address]       Ph: [phone]       Fax: [phone]   RxID:   [id number] BENAZEPRIL-HYDROCHLOROTHIAZIDE 20-12.5 MG  TABS (BENAZEPRIL-HYDROCHLOROTHIAZIDE) one daily  #90 x 6   Entered and Authorized by:   Gordy Savers  MD   Signed by:   Gordy Savers  MD on 02/27/2010   Method used:   Electronically to        [facility]. [phone]* (retail)       [phone] [address]       [place], [place]  [address]       Ph: [phone]       Fax:  [phone]   RxID:   [id number] METFORMIN HCL 1000 MG TABS (METFORMIN HCL) Take 1 tablet by mouth two times a day  #180 x 6   Entered and Authorized by:   Gordy Savers  MD   Signed by:   Gordy Savers  MD on 02/27/2010   Method used:   Electronically to        [facility]. [phone]* (retail)       [phone] [address]       [place], [place]  [address]       Ph: [phone]       Fax: [phone]   RxID:   [id number]    Orders Added: 1)  Est. Patient 40-64 years [33825]   Immunization History:  Influenza Immunization History:    Influenza:  Historical (02/13/2010)   Immunization History:  Influenza Immunization History:    Influenza:  Historical (02/13/2010)  given at work per pt . Earlean Polka

## 2010-05-16 NOTE — Assessment & Plan Note (Signed)
Summary: 6 month rov/njr   Vital Signs:  [name] profile:   55 year old male Weight:      183 pounds Temp:     97.9 degrees F oral BP sitting:   140 / 74  (left arm) Cuff size:   regular  Vitals Entered By: Duard Brady LPN (Aug 28, 2009 1:Justin PM) CC: 6 mos rov - doing well       bs170 (had eaten 2 hours before) Is [name] Diabetic? Yes Did you bring your meter with you today? No   CC:  6 mos rov - doing well       bs170 (had eaten 2 hours before).  History of Present Illness: Justin [name] who is in today for follow-up of his type 2 diabetes.  History of hypertension.  He has ED which has not been well controlled on Viagra 50.  He has a resolving cough for the past 3 weeks that he feels is allergy related and improving.  Blood pressure medication includes an ACE inhibitor.  Allergies (verified): No Known Drug Allergies  Past History:  Past Medical History: Allergic rhinitis Hypertension Diabetes mellitus, type II Colonic polyps, hx of ED  Family History: Reviewed history from 02/27/2009 and no changes required. Family History Hypertension Family History of Stroke F 1st degree relative <60 Family History Diabetes 1st degree relative father status post CABG at age 66; dementia -died age 78 mother status post stroke.  History hypertension  One brother history type 2 diabetes and hypertension one sister is well- HTN  Review of Systems       The [name] complains of suspicious skin lesions.  The [name] denies anorexia, fever, weight loss, weight gain, vision loss, decreased hearing, hoarseness, chest pain, syncope, dyspnea on exertion, peripheral edema, prolonged cough, headaches, hemoptysis, abdominal pain, melena, hematochezia, severe indigestion/heartburn, hematuria, incontinence, genital sores, muscle weakness, transient blindness, difficulty walking, depression, unusual weight change, abnormal bleeding, enlarged lymph nodes, angioedema, breast masses, and  testicular masses.    Physical Exam  General:  Well-developed,well-nourished,in no acute distress; alert,appropriate and cooperative throughout examination; of pressure 130/66 Head:  Normocephalic and atraumatic without obvious abnormalities. No apparent alopecia or balding. Eyes:  No corneal or conjunctival inflammation noted. EOMI. Perrla. Funduscopic exam benign, without hemorrhages, exudates or papilledema. Vision grossly normal. Ears:  External ear exam shows no significant lesions or deformities.  Otoscopic examination reveals clear canals, tympanic membranes are intact bilaterally without bulging, retraction, inflammation or discharge. Hearing is grossly normal bilaterally. Mouth:  Oral mucosa and oropharynx without lesions or exudates.  Teeth in good repair. Neck:  No deformities, masses, or tenderness noted. Lungs:  Normal respiratory effort, chest expands symmetrically. Lungs are clear to auscultation, no crackles or wheezes. Heart:  Normal rate and regular rhythm. S1 and S2 normal without gallop, murmur, click, rub or other extra sounds. Abdomen:  Bowel sounds positive,abdomen soft and non-tender without masses, organomegaly or hernias noted. Msk:  No deformity or scoliosis noted of thoracic or lumbar spine.   Pulses:  R and L carotid,radial,femoral,dorsalis pedis and posterior tibial pulses are full and equal bilaterally  Diabetes Management Exam:    Eye Exam:       Eye Exam done here today          Results: normal   Impression & Recommendations:  Problem # 1:  DIABETES MELLITUS, TYPE II (ICD-250.00)  His updated medication list for this problem includes:    Metformin Hcl 1000 Mg Tabs (Metformin hcl) .Marland Kitchen... Take 1 tablet  by mouth two times a day    Benazepril-hydrochlorothiazide 20-12.5 Mg Tabs (Benazepril-hydrochlorothiazide) ..... One daily    His updated medication list for this problem includes:    Metformin Hcl 1000 Mg Tabs (Metformin hcl) .Marland Kitchen... Take 1 tablet by mouth  two times a day    Benazepril-hydrochlorothiazide 20-12.5 Mg Tabs (Benazepril-hydrochlorothiazide) ..... One daily  Problem # 2:  HYPERTENSION (ICD-401.9)  His updated medication list for this problem includes:    Benazepril-hydrochlorothiazide 20-12.5 Mg Tabs (Benazepril-hydrochlorothiazide) ..... One daily    Amlodipine Besylate 5 Mg Tabs (Amlodipine besylate) ..... One daily  His updated medication list for this problem includes:    Benazepril-hydrochlorothiazide 20-12.5 Mg Tabs (Benazepril-hydrochlorothiazide) ..... One daily    Amlodipine Besylate 5 Mg Tabs (Amlodipine besylate) ..... One daily  Complete Medication List: 1)  Metformin Hcl 1000 Mg Tabs (Metformin hcl) .... Take 1 tablet by mouth two times a day 2)  Benazepril-hydrochlorothiazide 20-12.5 Mg Tabs (Benazepril-hydrochlorothiazide) .... One daily 3)  Amlodipine Besylate 5 Mg Tabs (Amlodipine besylate) .... One daily 4)  Viagra 100 Mg Tabs (Sildenafil citrate) .... As directed  Other Orders: Venipuncture (16[address]) TLB-A1C / Hgb A1C (Glycohemoglobin) (83036-A1C)  [name] Instructions: 1)  Please schedule a follow-up appointment in 6 months for CPX  2)  Limit your Sodium (Salt) to less than 2 grams a day(slightly less than 1/2 a teaspoon) to prevent fluid retention, swelling, or worsening of symptoms. 3)  It is important that you exercise regularly at least 20 minutes 5 times a week. If you develop chest pain, have severe difficulty breathing, or feel very tired , stop exercising immediately and seek medical attention. 4)  Check your blood sugars regularly. If your readings are usually above : or below 70 you should contact our office. 5)  It is important that your Diabetic A1c level is checked every 3 months. 6)  See your eye doctor yearly to check for diabetic eye damage. Prescriptions: VIAGRA 100 MG TABS (SILDENAFIL CITRATE) as directed  #12 x 12   Entered and Authorized by:   Gordy Savers  MD   Signed by:    Gordy Savers  MD on 08/28/2009   Method used:   Electronically to        [facility]* (mail-order)             ,          Ph: [phone]       Fax: [phone]   RxID:   [id number] AMLODIPINE BESYLATE 5 MG  TABS (AMLODIPINE BESYLATE) one daily  #90 x 6   Entered and Authorized by:   Gordy Savers  MD   Signed by:   Gordy Savers  MD on 08/28/2009   Method used:   Electronically to        [facility]* (mail-order)             ,          Ph: [phone]       Fax: [phone]   RxID:   [id number] BENAZEPRIL-HYDROCHLOROTHIAZIDE 20-12.5 MG  TABS (BENAZEPRIL-HYDROCHLOROTHIAZIDE) one daily  #90 x 6   Entered and Authorized by:   Gordy Savers  MD   Signed by:   Gordy Savers  MD on 08/28/2009   Method used:   Electronically to        [facility]* (mail-order)             ,  Ph: [phone]       Fax: [phone]   RxID:   [id number] METFORMIN HCL 1000 MG TABS (METFORMIN HCL) Take 1 tablet by mouth two times a day  #180 x 6   Entered and Authorized by:   Gordy Savers  MD   Signed by:   Gordy Savers  MD on 08/28/2009   Method used:   Electronically to        [facility]* (mail-order)             ,          Ph: [phone]       Fax: [phone]   RxID:   [id number]

## 2010-05-16 NOTE — Progress Notes (Signed)
Summary: hgbaic  Phone Note Call from Patient   Caller: Patient Call For: Dr. Kirtland Bouchard Summary of Call: Would like HgbAic results.  [phone] Initial call taken by: Lynann Beaver CMA,  May 15, 2007 4:36 PM    6.8 gm %  please call-at goal of < 7     Appended Document: hgbaic pt notified.

## 2010-05-16 NOTE — Letter (Signed)
Summary: [facility]  [facility]   Imported By: Maryln Gottron 09/27/2008 09:25:42  _____________________________________________________________________  External Attachment:    Type:   Image     Comment:   External Document

## 2010-05-16 NOTE — Assessment & Plan Note (Signed)
Summary: 6 mo rov/mm   Vital Signs:  Patient profile:   55 year old male Weight:      182 pounds BP sitting:   130 / 64  (left arm) Cuff size:   regular  Vitals Entered By: Raechel Ache, RN (February 27, 2009 2:03 PM) CC: 6 mo f/u.   CC:  6 mo f/u.Marland Kitchen  History of Present Illness: a 55 year old patient who is seen today for follow-up of his hypertensionand type 2 diabetes he is doing quite well.  Blood sugars remain under minus control.  No concerns or complaints.  He has had a difficult year was in both parents this past summer and fall  Allergies: No Known Drug Allergies  Past History:  Past Medical History: Reviewed history from 01/18/2008 and no changes required. Allergic rhinitis Hypertension Diabetes mellitus, type II Colonic polyps, hx of  Family History: Reviewed history from 08/25/2008 and no changes required. Family History Hypertension Family History of Stroke F 1st degree relative <60 Family History Diabetes 1st degree relative father status post CABG at age 28; dementia -died age 95 mother status post stroke.  History hypertension  One brother history type 2 diabetes and hypertension one sister is well- HTN  Social History: Reviewed history from 08/24/2007 and no changes required. Alcohol use-no Drug use-no Married  two twin  daughters with  CP; wheelchair bound  Review of Systems  The patient denies anorexia, fever, weight loss, weight gain, vision loss, decreased hearing, hoarseness, chest pain, syncope, dyspnea on exertion, peripheral edema, prolonged cough, headaches, hemoptysis, abdominal pain, melena, hematochezia, severe indigestion/heartburn, hematuria, incontinence, genital sores, muscle weakness, suspicious skin lesions, transient blindness, difficulty walking, depression, unusual weight change, abnormal bleeding, enlarged lymph nodes, angioedema, breast masses, and testicular masses.    Physical Exam  General:   Well-developed,well-nourished,in no acute distress; alert,appropriate and cooperative throughout examination; the pressure 120/64 Head:  Normocephalic and atraumatic without obvious abnormalities. No apparent alopecia or balding. Mouth:  Oral mucosa and oropharynx without lesions or exudates.  Teeth in good repair. Neck:  No deformities, masses, or tenderness noted. Lungs:  Normal respiratory effort, chest expands symmetrically. Lungs are clear to auscultation, no crackles or wheezes. Heart:  Normal rate and regular rhythm. S1 and S2 normal without gallop, murmur, click, rub or other extra sounds. Abdomen:  Bowel sounds positive,abdomen soft and non-tender without masses, organomegaly or hernias noted. Msk:  No deformity or scoliosis noted of thoracic or lumbar spine.   Pulses:  R and L carotid,radial,femoral,dorsalis pedis and posterior tibial pulses are full and equal bilaterally Extremities:  No clubbing, cyanosis, edema, or deformity noted with normal full range of motion of all joints.     Impression & Recommendations:  Problem # 1:  DIABETES MELLITUS, TYPE II (ICD-250.00)  His updated medication list for this problem includes:    Metformin Hcl 1000 Mg Tabs (Metformin hcl) .Marland Kitchen... Take 1 tablet by mouth two times a day    Benazepril-hydrochlorothiazide 20-12.5 Mg Tabs (Benazepril-hydrochlorothiazide) ..... One daily  Orders: Venipuncture (60454) TLB-A1C / Hgb A1C (Glycohemoglobin) (83036-A1C)  Problem # 2:  HYPERTENSION (ICD-401.9)  His updated medication list for this problem includes:    Benazepril-hydrochlorothiazide 20-12.5 Mg Tabs (Benazepril-hydrochlorothiazide) ..... One daily    Amlodipine Besylate 5 Mg Tabs (Amlodipine besylate) ..... One daily  His updated medication list for this problem includes:    Benazepril-hydrochlorothiazide 20-12.5 Mg Tabs (Benazepril-hydrochlorothiazide) ..... One daily    Amlodipine Besylate 5 Mg Tabs (Amlodipine besylate) ..... One  daily  Complete  Medication List: 1)  Viagra 50 Mg Tabs (Sildenafil citrate) 2)  Metformin Hcl 1000 Mg Tabs (Metformin hcl) .... Take 1 tablet by mouth two times a day 3)  Benazepril-hydrochlorothiazide 20-12.5 Mg Tabs (Benazepril-hydrochlorothiazide) .... One daily 4)  Amlodipine Besylate 5 Mg Tabs (Amlodipine besylate) .... One daily 5)  Neomycin-polymyxin-hc 3.5-10000-1 Soln (Neomycin-polymyxin-hc) .... 3 drops to the left ear 4 times daily  Patient Instructions: 1)  Please schedule a follow-up appointment in 6 months. 2)  Limit your Sodium (Salt). 3)  It is important that you exercise regularly at least 20 minutes 5 times a week. If you develop chest pain, have severe difficulty breathing, or feel very tired , stop exercising immediately and seek medical attention. 4)  Check your blood sugars regularly. If your readings are usually above : or below 70 you should contact our office. 5)  It is important that your Diabetic A1c level is checked every 3 months. 6)  See your eye doctor yearly to check for diabetic eye damage. Prescriptions: AMLODIPINE BESYLATE 5 MG  TABS (AMLODIPINE BESYLATE) one daily  #90 x 6   Entered and Authorized by:   Gordy Savers  MD   Signed by:   Gordy Savers  MD on 02/27/2009   Method used:   Print then Give to Patient   RxID:   [id number] BENAZEPRIL-HYDROCHLOROTHIAZIDE 20-12.5 MG  TABS (BENAZEPRIL-HYDROCHLOROTHIAZIDE) one daily  #90 x 6   Entered and Authorized by:   Gordy Savers  MD   Signed by:   Gordy Savers  MD on 02/27/2009   Method used:   Print then Give to Patient   RxID:   [id number] METFORMIN HCL 1000 MG TABS (METFORMIN HCL) Take 1 tablet by mouth two times a day  #180 x 6   Entered and Authorized by:   Gordy Savers  MD   Signed by:   Gordy Savers  MD on 02/27/2009   Method used:   Print then Give to Patient   RxID:   [id number] VIAGRA 50 MG TABS (SILDENAFIL CITRATE)   #12 x 6   Entered  and Authorized by:   Gordy Savers  MD   Signed by:   Gordy Savers  MD on 02/27/2009   Method used:   Print then Give to Patient   RxID:   [id number]

## 2010-05-16 NOTE — Assessment & Plan Note (Signed)
Summary: RECTAL BLEEDING OK PER KIM/NJR   Vital Signs:  Patient profile:   55 year old male Weight:      182 pounds Temp:     98.2 degrees F oral BP sitting:   140 / 70  (left arm) Cuff size:   regular  Vitals Entered By: Kathrynn Speed CMA (December 04, 2009 2:35 PM) CC: Rectal bleeding, infected prostate, x 2 days, src Is Patient Diabetic? Yes CBG Result 235   CC:  Rectal bleeding, infected prostate, x 2 days, and src.  History of Present Illness:  55 year old patient who was seen in Saturday clinic and treated for acute prostatitis.  At that time.  He was noted to have some rectal irritation, but no obvious hemorrhoids.  No hemorrhoids were reported on a colonoscopy in 2009.  since yesterday morning.  He has had several episodes of low volume bright red rectal bleeding.  His rectal discomfort has largely resolved, and the bleeding has steadily improved.  He has been using some Advil.  He has hypertension and diabetes.  He feels that he is improved on Cipro and is voiding much better.  He has had some recent constipation that has also improved.  Current Medications (verified): 1)  Metformin Hcl 1000 Mg Tabs (Metformin Hcl) .... Take 1 Tablet By Mouth Two Times A Day 2)  Benazepril-Hydrochlorothiazide 20-12.5 Mg  Tabs (Benazepril-Hydrochlorothiazide) .... One Daily 3)  Amlodipine Besylate 5 Mg  Tabs (Amlodipine Besylate) .... One Daily 4)  Viagra 100 Mg Tabs (Sildenafil Citrate) .... As Directed 5)  Ciprofloxacin Hcl 500 Mg Tabs (Ciprofloxacin Hcl) .Marland Kitchen.. 1 Tab By Mouth Two Times A Day For Prostate Infection  Allergies (verified): No Known Drug Allergies  Past History:  Past Medical History: Reviewed history from 08/28/2009 and no changes required. Allergic rhinitis Hypertension Diabetes mellitus, type II Colonic polyps, hx of ED  Physical Exam  General:  Well-developed,well-nourished,in no acute distress; alert,appropriate and cooperative throughout examination Rectal:   this was not repeated since the exam was performed just two days ago.   Impression & Recommendations:  Problem # 1:  RECTAL BLEEDING (ICD-569.3) this appears to be low volume  and fairly infrequent and improving.  Will clinically observe at this time.  He will notify us if bleeding persists or worsens.  He will complete antibiotic therapy  Problem # 2:  PROSTATITIS, ACUTE (ICD-601.0)  Complete Medication List: 1)  Metformin Hcl 1000 Mg Tabs (Metformin hcl) .... Take 1 tablet by mouth two times a day 2)  Benazepril-hydrochlorothiazide 20-12.5 Mg Tabs (Benazepril-hydrochlorothiazide) .... One daily 3)  Amlodipine Besylate 5 Mg Tabs (Amlodipine besylate) .... One daily 4)  Viagra 100 Mg Tabs (Sildenafil citrate) .... As directed 5)  Ciprofloxacin Hcl 500 Mg Tabs (Ciprofloxacin hcl) .Marland Kitchen.. 1 tab by mouth two times a day for prostate infection  Other Orders: Capillary Blood Glucose/CBG (16109)  Patient Instructions: 1)  call if rectal bleeding persists or worsens

## 2010-05-16 NOTE — Letter (Signed)
Summary: Eye Exam/[facility]  Eye Exam/[facility]   Imported By: Maryln Gottron 09/26/2009 14:29:30  _____________________________________________________________________  External Attachment:    Type:   Image     Comment:   External Document

## 2010-05-16 NOTE — Assessment & Plan Note (Signed)
Summary: UNABLE TO URINATE...AS.   Vital Signs:  Patient profile:   55 year old male Weight:      180 pounds BMI:     24.50 Temp:     97.4 degrees F oral Pulse rate:   80 / minute Pulse rhythm:   regular BP sitting:   140 / 60  (right arm) Cuff size:   regular  Vitals Entered By: Lowella Petties CMA (December 02, 2009 10:37 AM) CC: Hemorrhoids x 2 weeks, difficulty urinating   History of Present Illness: Just back from [facility] recent visit here for diarrhea and some hemorrhoids (not addressed at visit) this seemed better but deveoloped more problems out there  Having rectal pain trouble moving bowels had some blood at first but not now Using preparation H and advil  Having trouble passing urine couldn't even go since yesterday AM unable to go on flight or when he got home--finally a little in early AM Pain seems to be mostly in low back (around buttocks)  No fever  No diarrhea meds recently Has taken claritin but not decongestant and not in past week  no discrete dysuria No hematuria    Allergies: No Known Drug Allergies  Past History:  Past medical, surgical, family and social histories (including risk factors) reviewed for relevance to current acute and chronic problems.  Past Medical History: Reviewed history from 08/28/2009 and no changes required. Allergic rhinitis Hypertension Diabetes mellitus, type II Colonic polyps, hx of ED  Past Surgical History: Reviewed history from 01/18/2008 and no changes required. Denies surgical history colonoscopy 2009  Family History: Reviewed history from 02/27/2009 and no changes required. Family History Hypertension Family History of Stroke F 1st degree relative <60 Family History Diabetes 1st degree relative father status post CABG at age 71; dementia -died age 58 mother status post stroke.  History hypertension  One brother history type 2 diabetes and hypertension one sister is well- HTN  Social  History: Reviewed history from 08/24/2007 and no changes required. Alcohol use-no Drug use-no Married  two twin  daughters with  CP; wheelchair bound  Review of Systems       no recent sex No known hematospermia   Physical Exam  General:  alert.  NAD Abdomen:  soft, non-tender, no distention, and no masses.   No suprapubic dullness Rectal:  no hemorrhoids and no masses.   Mild inflammation caudal to rectum Prostate:  no gland enlargement.  Marked inflammation Msk:  No spine or CVA tenderness   Impression & Recommendations:  Problem # 1:  PROSTATITIS, ACUTE (ICD-601.0) Assessment New seems to be causing all the symptoms mild perirectal puffiness but no fistula or worrisome findings will have him use OTC hydrocortisone cream there  cipro for 3 weeks  no evidence of urinary retention so no intervention needed continue NSAIDs  Complete Medication List: 1)  Metformin Hcl 1000 Mg Tabs (Metformin hcl) .... Take 1 tablet by mouth two times a day 2)  Benazepril-hydrochlorothiazide 20-12.5 Mg Tabs (Benazepril-hydrochlorothiazide) .... One daily 3)  Amlodipine Besylate 5 Mg Tabs (Amlodipine besylate) .... One daily 4)  Viagra 100 Mg Tabs (Sildenafil citrate) .... As directed 5)  Ciprofloxacin Hcl 500 Mg Tabs (Ciprofloxacin hcl) .Marland Kitchen.. 1 tab by mouth two times a day for prostate infection  Patient Instructions: 1)  Please take the full 3 weeks of antibiotics 2)  Call Dr Amador Cunas if the pain is not better in 2-3 days 3)  Continue the ibuprofen 600mg  three times a day  4)  Use over the counter cortisone cream for soreness around rectum Prescriptions: CIPROFLOXACIN HCL 500 MG TABS (CIPROFLOXACIN HCL) 1 tab by mouth two times a day for prostate infection  #42 x 0   Entered and Authorized by:   Cindee Salt MD   Signed by:   Cindee Salt MD on 12/02/2009   Method used:   Electronically to        [facility]. [phone]* (retail)       [phone]       [place], [place]  [address]       Ph: [phone]       Fax: [phone]   RxID:   [id number]

## 2010-05-16 NOTE — Consult Note (Signed)
Summary: [facility]  [facility]   Imported By: Lanelle Bal 09/21/2007 09:45:07  _____________________________________________________________________  External Attachment:    Type:   Image     Comment:   External Document

## 2010-05-16 NOTE — Miscellaneous (Signed)
Summary: gi previsit  Clinical Lists Changes  Medications: Added new medication of MOVIPREP 100 GM  SOLR (PEG-KCL-NACL-NASULF-NA ASC-C) As per prep instructions. - Signed Rx of MOVIPREP 100 GM  SOLR (PEG-KCL-NACL-NASULF-NA ASC-C) As per prep instructions.;  #1 x 0;  Signed;  Entered by: Olene Craven RN;  Authorized by: Rachael Fee MD;  Method used: Electronic Observations: Added new observation of ALLERGY REV: Done (10/12/2007 13:07) Added new observation of NKA: T (10/12/2007 13:07)    Prescriptions: MOVIPREP 100 GM  SOLR (PEG-KCL-NACL-NASULF-NA ASC-C) As per prep instructions.  #1 x 0   Entered by:   Olene Craven RN   Authorized by:   Rachael Fee MD   Signed by:   Olene Craven RN on 10/12/2007   Method used:   Electronically sent to ...       [facility]. #[id number]*       [address]       [place], [place]  [address]       Ph: [phone]       Fax: [phone]   RxID:   [id number]

## 2010-05-16 NOTE — Assessment & Plan Note (Signed)
Summary: intestinal issues//ccm   Vital Signs:  [name] profile:   55 year old male Weight:      180 pounds Temp:     98.0 degrees F oral BP sitting:   112 / 68  (right arm) Cuff size:   regular  Vitals Entered By: Duard Brady LPN (November 14, 2009 2:32 PM) CC: c/o on/off diarrhea then constipation , bloating x1week    also has posion ivy(R) leg Is [name] Diabetic? Yes   CC:  c/o on/off diarrhea then constipation  and bloating x1week    also has posion ivy(R) leg.  History of Present Illness: Justin [name] has a history of type 2 diabetes;for the past few days.  He has had some stomach upset with bloating and some loose, watery stool.  It is also recovering from some contact dermatitis due to poison ivy.  He has treated hypertension.  He is scheduled for his annual physical in November.  No nausea, vomiting, or abdominal pain  Allergies (verified): No Known Drug Allergies  Past History:  Past Medical History: Reviewed history from 08/28/2009 and no changes required. Allergic rhinitis Hypertension Diabetes mellitus, type II Colonic polyps, hx of ED  Past Surgical History: Reviewed history from 01/18/2008 and no changes required. Denies surgical history colonoscopy 2009  Review of Systems       The [name] complains of anorexia.  The [name] denies fever, weight loss, weight gain, vision loss, decreased hearing, hoarseness, chest pain, syncope, dyspnea on exertion, peripheral edema, prolonged cough, headaches, hemoptysis, abdominal pain, melena, hematochezia, severe indigestion/heartburn, hematuria, incontinence, genital sores, muscle weakness, suspicious skin lesions, transient blindness, difficulty walking, depression, unusual weight change, abnormal bleeding, enlarged lymph nodes, angioedema, breast masses, and testicular masses.    Physical Exam  General:  Well-developed,well-nourished,in no acute distress; alert,appropriate and cooperative throughout  examination Head:  Normocephalic and atraumatic without obvious abnormalities. No apparent alopecia or balding. Eyes:  No corneal or conjunctival inflammation noted. EOMI. Perrla. Funduscopic exam benign, without hemorrhages, exudates or papilledema. Vision grossly normal. Ears:  External ear exam shows no significant lesions or deformities.  Otoscopic examination reveals clear canals, tympanic membranes are intact bilaterally without bulging, retraction, inflammation or discharge. Hearing is grossly normal bilaterally. Mouth:  Oral mucosa and oropharynx without lesions or exudates.  Teeth in good repair. Neck:  No deformities, masses, or tenderness noted. Lungs:  Normal respiratory effort, chest expands symmetrically. Lungs are clear to auscultation, no crackles or wheezes. Heart:  Normal rate and regular rhythm. S1 and S2 normal without gallop, murmur, click, rub or other extra sounds. Abdomen:  soft flat, nontender, normal bowel sounds.  No organomegaly   Impression & Recommendations:  Problem # 1:  GASTROENTERITIS, VIRAL (ICD-008.8) Align-  one daily   Problem # 2:  DIABETES MELLITUS, TYPE II (ICD-250.00)  His updated medication list for this problem includes:    Metformin Hcl 1000 Mg Tabs (Metformin hcl) .Marland Kitchen... Take 1 tablet by mouth two times a day    Benazepril-hydrochlorothiazide 20-12.5 Mg Tabs (Benazepril-hydrochlorothiazide) ..... One daily  His updated medication list for this problem includes:    Metformin Hcl 1000 Mg Tabs (Metformin hcl) .Marland Kitchen... Take 1 tablet by mouth two times a day    Benazepril-hydrochlorothiazide 20-12.5 Mg Tabs (Benazepril-hydrochlorothiazide) ..... One daily  Complete Medication List: 1)  Metformin Hcl 1000 Mg Tabs (Metformin hcl) .... Take 1 tablet by mouth two times a day 2)  Benazepril-hydrochlorothiazide 20-12.5 Mg Tabs (Benazepril-hydrochlorothiazide) .... One daily 3)  Amlodipine Besylate 5 Mg Tabs (  Amlodipine besylate) .... One daily 4)  Viagra  100 Mg Tabs (Sildenafil citrate) .... As directed  [name] Instructions: 1)  Drink clear liquids only for the next 24 hours, then slowly add other liquids and food as you  tolerate them. 2)  decrease metformin to once daily only for two or 3 days 3)  Align- one daily

## 2010-05-16 NOTE — Progress Notes (Signed)
Summary: PHARMACY VERIFICATION  Phone Note Call from Patient   Caller: Patient   [phone] Summary of Call: Pt called to advise that the Rx for meds he received at his OV today should be sent to [facility].  Initial call taken by: Debbra Riding,  February 27, 2010 4:58 PM  Follow-up for Phone Call        rx's faxed to [facility] Follow-up by: Duard Brady LPN,  February 28, 2010 11:51 AM    Prescriptions: VIAGRA 100 MG TABS (SILDENAFIL CITRATE) as directed  #12 x 12   Entered by:   Duard Brady LPN   Authorized by:   Gordy Savers  MD   Signed by:   Duard Brady LPN on 78/29/5621   Method used:   Faxed to ...       [facility] (mail-order)             , [place]         Ph: [phone]       Fax: [phone]   RxID:   [id number] AMLODIPINE BESYLATE 5 MG  TABS (AMLODIPINE BESYLATE) one daily  #90 x 6   Entered by:   Duard Brady LPN   Authorized by:   Gordy Savers  MD   Signed by:   Duard Brady LPN on 47/42/5956   Method used:   Faxed to ...       [facility] (mail-order)             , [place]         Ph: [phone]       Fax: [phone]   RxID:   [id number] BENAZEPRIL-HYDROCHLOROTHIAZIDE 20-12.5 MG  TABS (BENAZEPRIL-HYDROCHLOROTHIAZIDE) one daily  #90 x 6   Entered by:   Duard Brady LPN   Authorized by:   Gordy Savers  MD   Signed by:   Duard Brady LPN on 20/25/4270   Method used:   Faxed to ...       [facility] (mail-order)             , [place]         Ph: [phone]       Fax: [phone]   RxID:   [id number] METFORMIN HCL 1000 MG TABS (METFORMIN HCL) Take 1 tablet by mouth two times a day  #180 x 6   Entered by:   Duard Brady LPN   Authorized by:   Gordy Savers  MD   Signed by:   Duard Brady LPN on 09/38/1829   Method used:   Faxed to ...       [facility] (mail-order)             , [place]         Ph: [phone]       Fax: [phone]   RxID:   [id number]

## 2010-05-16 NOTE — Letter (Signed)
Summary: Results Letter  [facility]  [address] [place], [place] [address]   Phone: [phone]  Fax: [phone]        October 27, 2007 MRN: [id number]    [facility] [address] [place], [place]  [address]    Dear Mr. 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  These are pre-cancerous polyps that may have grown into cancers if they had not been removed.  Current colon polyp surveillance guidelines recommend that you have a repeat colonoscopy in 5 years.  We will therefore put your information in our reminder system and will contact you in 5 years to schedule a repeat procedure.  Please call if you have any questions or concerns.      Sincerely,  Rachael Fee MD  This letter has been electronically signed by your physician.

## 2010-05-16 NOTE — Assessment & Plan Note (Signed)
Summary: 5 MONTH ROV/NJR   Vital Signs:  Patient Profile:   55 Years Old Male Weight:      183 pounds Temp:     98.5 degrees F oral BP sitting:   140 / 58  (left arm) Cuff size:   regular  Vitals Entered By: Raechel Ache, RN (January 18, 2008 2:00 PM)                And a and he isFlu Vaccine Consent Questions     Do you have a history of severe allergic reactions to this vaccine? no    Any prior history of allergic reactions to egg and/or gelatin? no    Do you have a sensitivity to the preservative Thimersol? no    Do you have a past history of Guillan-Barre Syndrome? no    Do you currently have an acute febrile illness? no    Have you ever had a severe reaction to latex? no    Vaccine information given and explained to patient? yes    Are you currently pregnant? no    Lot Number:[id number]   Site Given  Right Deltoid IM   Chief Complaint:  ROV and BS 141.Marland Kitchen  History of Present Illness: 55 year old gentleman seen today for follow-up of his hypertension and type 2 diabetes.  He maintains nice home.  Glycemic control.  Since his last visit here.  He has had colonoscopy that revealed a single polyp    Current Allergies: No known allergies   Past Medical History:    Reviewed history from 04/21/2007 and no changes required:       Allergic rhinitis       Hypertension       Diabetes mellitus, type II       Colonic polyps, hx of  Past Surgical History:    Reviewed history from 12/23/2006 and no changes required:       Denies surgical history       colonoscopy 2009     Review of Systems  The patient denies anorexia, fever, weight loss, weight gain, vision loss, decreased hearing, hoarseness, chest pain, syncope, dyspnea on exertion, peripheral edema, prolonged cough, headaches, hemoptysis, abdominal pain, melena, hematochezia, severe indigestion/heartburn, hematuria, incontinence, genital sores, muscle weakness, suspicious skin lesions, transient blindness,  difficulty walking, depression, unusual weight change, abnormal bleeding, enlarged lymph nodes, angioedema, breast masses, and testicular masses.     Physical Exam  General:     Well-developed,well-nourished,in no acute distress; alert,appropriate and cooperative throughout examination Head:     Normocephalic and atraumatic without obvious abnormalities. No apparent alopecia or balding. Eyes:     No corneal or conjunctival inflammation noted. EOMI. Perrla. Funduscopic exam benign, without hemorrhages, exudates or papilledema. Vision grossly normal. Mouth:     Oral mucosa and oropharynx without lesions or exudates.  Teeth in good repair. Neck:     No deformities, masses, or tenderness noted. Lungs:     Normal respiratory effort, chest expands symmetrically. Lungs are clear to auscultation, no crackles or wheezes. Heart:     Normal rate and regular rhythm. S1 and S2 normal without gallop, murmur, click, rub or other extra sounds. Abdomen:     Bowel sounds positive,abdomen soft and non-tender without masses, organomegaly or hernias noted.    Impression & Recommendations:  Problem # 1:  DIABETES MELLITUS, TYPE II (ICD-250.00)  His updated medication list for this problem includes:    Metformin Hcl 1000 Mg Tabs (Metformin hcl) .Marland Kitchen... Take 1 tablet  by mouth two times a day    Benazepril-hydrochlorothiazide 20-12.5 Mg Tabs (Benazepril-hydrochlorothiazide) ..... One daily  Orders: Venipuncture (04540) TLB-A1C / Hgb A1C (Glycohemoglobin) (83036-A1C)   Problem # 2:  HYPERTENSION (ICD-401.9)  His updated medication list for this problem includes:    Benazepril-hydrochlorothiazide 20-12.5 Mg Tabs (Benazepril-hydrochlorothiazide) ..... One daily    Amlodipine Besylate 5 Mg Tabs (Amlodipine besylate) ..... One daily   Complete Medication List: 1)  Viagra 50 Mg Tabs (Sildenafil citrate) 2)  Metformin Hcl 1000 Mg Tabs (Metformin hcl) .... Take 1 tablet by mouth two times a day 3)   Cialis 20 Mg Tabs (Tadalafil) .... As needed 4)  Benazepril-hydrochlorothiazide 20-12.5 Mg Tabs (Benazepril-hydrochlorothiazide) .... One daily 5)  Amlodipine Besylate 5 Mg Tabs (Amlodipine besylate) .... One daily  Other Orders: Admin 1st Vaccine (98119) Flu Vaccine 23yrs + (14782)   Patient Instructions: 1)  Please schedule a follow-up appointment in 6 months. 2)  Limit your Sodium (Salt) to less than 2 grams a day(slightly less than 1/2 a teaspoon) to prevent fluid retention, swelling, or worsening of symptoms. 3)  It is important that you exercise regularly at least 20 minutes 5 times a week. If you develop chest pain, have severe difficulty breathing, or feel very tired , stop exercising immediately and seek medical attention. 4)  Check your blood sugars regularly. If your readings are usually above : or below 70 you should contact our office. 5)  It is important that your Diabetic A1c level is checked every 3 months.   Prescriptions: AMLODIPINE BESYLATE 5 MG  TABS (AMLODIPINE BESYLATE) one daily  #90 x 6   Entered and Authorized by:   Gordy Savers  MD   Signed by:   Gordy Savers  MD on 01/18/2008   Method used:   Print then Give to Patient   RxID:   [id number] BENAZEPRIL-HYDROCHLOROTHIAZIDE 20-12.5 MG  TABS (BENAZEPRIL-HYDROCHLOROTHIAZIDE) one daily  #90 x 6   Entered and Authorized by:   Gordy Savers  MD   Signed by:   Gordy Savers  MD on 01/18/2008   Method used:   Print then Give to Patient   RxID:   [id number] CIALIS 20 MG TABS (TADALAFIL) as needed  #12 x 0   Entered and Authorized by:   Gordy Savers  MD   Signed by:   Gordy Savers  MD on 01/18/2008   Method used:   Print then Give to Patient   RxID:   [id number] METFORMIN HCL 1000 MG TABS (METFORMIN HCL) Take 1 tablet by mouth two times a day  #180 x 6   Entered and Authorized by:   Gordy Savers  MD   Signed by:   Gordy Savers  MD on 01/18/2008    Method used:   Print then Give to Patient   RxID:   [id number] VIAGRA 50 MG TABS (SILDENAFIL CITRATE)   #12 x 6   Entered and Authorized by:   Gordy Savers  MD   Signed by:   Gordy Savers  MD on 01/18/2008   Method used:   Print then Give to Patient   RxID:   [id number]  ]

## 2010-05-16 NOTE — Progress Notes (Signed)
Summary: Call A Nurse       Additional Follow-up for Phone Call Additional follow up Details #2::    Noted Follow-up by: Kathrynn Speed CMA,  December 04, 2009 10:11 AM   Call-A-Nurse Triage Call Report Triage Record Num: [id number] Operator: Loraine Leriche Neurohr Patient Name: Justin [name] Call Date & Time: 12/02/2009 9:33:08AM Patient Phone: [phone] PCP: Patient Gender: Male PCP Fax : Patient DOB: May 25, 1955 Practice Name: [facility] Reason for Call: Pt is calling about trouble urination last pm. Pt stated that he was on a plane and that he took Advil po to relax the inflammation. Pt is now able to urinate. Afebrile. An appoinment was made for this pt at the [place] office at 1000 am. See Provider within 72 hours. Protocol(s) Used: Urinary Symptoms - Male Recommended Outcome per Protocol: See Provider within 72 Hours Reason for Outcome: Pain in rectum with urination Care Advice:  ~ Avoid sexual activity of any kind until evaluated by provider.  ~ Laxatives should not be taken until cause of pain is evaluated by provider.  ~ CAUTIONS See a provider within 24 hours if you are having urinary urgency, frequency, discolored urine, pain or burning with urination; pain/discomfort over bladder.  ~ 08/

## 2010-05-16 NOTE — Assessment & Plan Note (Signed)
Summary: roa-smm   Vital Signs:  Patient Profile:   55 Years Old Male Weight:      187 pounds Temp:     98.4 degrees F oral BP sitting:   170 / 70  (left arm)  Vitals Entered By: Raechel Ache, RN (April 21, 2007 1:21 PM)                 Chief Complaint:  ROV. BS's around 150.Marland Kitchen  History of Present Illness: 55 year old patient seen today for follow-up of his diabetes and hypertension.  Home blood pressure readings do reveal persistent mild systolic elevations with a low or normal diastolic readings.  Current Allergies: No known allergies   Past Medical History:    Allergic rhinitis    Hypertension    Diabetes mellitus, type II   Family History:    Family History Hypertension    Family History of Stroke F 1st degree relative <60    Family History Diabetes 1st degree relative    father status post CABG at age 43    mother status post stroke.  History hypertension        One brother history type 2 diabetes and hypertension    one sister is well     Physical Exam  General:     150/70 Head:     Normocephalic and atraumatic without obvious abnormalities. No apparent alopecia or balding. Eyes:     No corneal or conjunctival inflammation noted. EOMI. Perrla. Funduscopic exam benign, without hemorrhages, exudates or papilledema. Vision grossly normal. Mouth:     Oral mucosa and oropharynx without lesions or exudates.  Teeth in good repair. Neck:     No deformities, masses, or tenderness noted. Lungs:     Normal respiratory effort, chest expands symmetrically. Lungs are clear to auscultation, no crackles or wheezes. Heart:     Normal rate and regular rhythm. S1 and S2 normal without gallop, murmur, click, rub or other extra sounds. Abdomen:     Bowel sounds positive,abdomen soft and non-tender without masses, organomegaly or hernias noted. Msk:     No deformity or scoliosis noted of thoracic or lumbar spine.   Pulses:     R and L  carotid,radial,femoral,dorsalis pedis and posterior tibial pulses are full and equal bilaterally    Impression & Recommendations:  Problem # 1:  DIABETES MELLITUS, TYPE II (ICD-250.00)  His updated medication list for this problem includes:    Metformin Hcl 1000 Mg Tabs (Metformin hcl) .Marland Kitchen... Take 1 tablet by mouth two times a day    Benazepril-hydrochlorothiazide 20-12.5 Mg Tabs (Benazepril-hydrochlorothiazide) ..... One daily  Orders: Venipuncture (16109) TLB-A1C / Hgb A1C (Glycohemoglobin) (83036-A1C)   Problem # 2:  HYPERTENSION (ICD-401.9)  His updated medication list for this problem includes:    Benazepril-hydrochlorothiazide 20-12.5 Mg Tabs (Benazepril-hydrochlorothiazide) ..... One daily    Amlodipine Besylate 5 Mg Tabs (Amlodipine besylate) ..... One daily   Complete Medication List: 1)  Viagra 50 Mg Tabs (Sildenafil citrate) 2)  Metformin Hcl 1000 Mg Tabs (Metformin hcl) .... Take 1 tablet by mouth two times a day 3)  Cialis 20 Mg Tabs (Tadalafil) .... As needed 4)  Benazepril-hydrochlorothiazide 20-12.5 Mg Tabs (Benazepril-hydrochlorothiazide) .... One daily 5)  Amlodipine Besylate 5 Mg Tabs (Amlodipine besylate) .... One daily   Patient Instructions: 1)  Please schedule a follow-up appointment in 4 months. 2)  Advised not to eat any food or drink any liquids after 10 PM the night before your procedure. 3)  It is important that you exercise regularly at least 20 minutes 5 times a week. If you develop chest pain, have severe difficulty breathing, or feel very tired , stop exercising immediately and seek medical attention. 4)  Check your blood sugars regularly. If your readings are usually above : or below 70 you should contact our office. 5)  It is important that your Diabetic A1c level is checked every 3 months. 6)  See your eye doctor yearly to check for diabetic eye damage. 7)  Check your Blood Pressure regularly. If it is above: you should make an  appointment.    Prescriptions: AMLODIPINE BESYLATE 5 MG  TABS (AMLODIPINE BESYLATE) one daily  #90 x 6   Entered and Authorized by:   Gordy Savers  MD   Signed by:   Gordy Savers  MD on 04/21/2007   Method used:   Print then Give to Patient   RxID:   [id number] BENAZEPRIL-HYDROCHLOROTHIAZIDE 20-12.5 MG  TABS (BENAZEPRIL-HYDROCHLOROTHIAZIDE) one daily  #90 x 6   Entered and Authorized by:   Gordy Savers  MD   Signed by:   Gordy Savers  MD on 04/21/2007   Method used:   Print then Give to Patient   RxID:   [id number] METFORMIN HCL 1000 MG TABS (METFORMIN HCL) Take 1 tablet by mouth two times a day  #180 x 6   Entered and Authorized by:   Gordy Savers  MD   Signed by:   Gordy Savers  MD on 04/21/2007   Method used:   Print then Give to Patient   RxID:   [id number] VIAGRA 50 MG TABS (SILDENAFIL CITRATE)   #12 x 6   Entered and Authorized by:   Gordy Savers  MD   Signed by:   Gordy Savers  MD on 04/21/2007   Method used:   Print then Give to Patient   RxID:   [id number]  ]

## 2010-05-16 NOTE — Assessment & Plan Note (Signed)
Summary: EAR PAIN//CCM   Vital Signs:  Patient profile:   55 year old male Weight:      185 pounds BP sitting:   124 / 70  (left arm) Cuff size:   regular  Vitals Entered By: Kern Reap CMA Duncan Dull) (November 28, 2008 10:13 AM)  Reason for Visit left ear infection  History of Present Illness: 55 year old patient who has a 3-day  history of a painful left ear.  He feels there is been some minimal drainage from the canal.  He is treated hypertension and diabetes which has been stable  Allergies: No Known Drug Allergies  Physical Exam  General:  Well-developed,well-nourished,in no acute distress; alert,appropriate and cooperative throughout examination Head:  Normocephalic and atraumatic without obvious abnormalities. No apparent alopecia or balding. Ears:  the right canal,  and TM were normal; the left canal is slightly swollen with some inflammatory debris and minimal amount of wax.  The canal was gently irrigated.  There was some tenderness in the left preauricular area   Impression & Recommendations:  Problem # 1:  OTITIS EXTERNA, ACUTE, LEFT (ICD-380.12)  His updated medication list for this problem includes:    Neomycin-polymyxin-hc 3.5-10000-1 Soln (Neomycin-polymyxin-hc) .Marland KitchenMarland KitchenMarland KitchenMarland Kitchen 3 drops to the left ear 4 times daily  His updated medication list for this problem includes:    Neomycin-polymyxin-hc 3.5-10000-1 Soln (Neomycin-polymyxin-hc) .Marland KitchenMarland KitchenMarland KitchenMarland Kitchen 3 drops to the left ear 4 times daily  Problem # 2:  HYPERTENSION (ICD-401.9)  His updated medication list for this problem includes:    Benazepril-hydrochlorothiazide 20-12.5 Mg Tabs (Benazepril-hydrochlorothiazide) ..... One daily    Amlodipine Besylate 5 Mg Tabs (Amlodipine besylate) ..... One daily  His updated medication list for this problem includes:    Benazepril-hydrochlorothiazide 20-12.5 Mg Tabs (Benazepril-hydrochlorothiazide) ..... One daily    Amlodipine Besylate 5 Mg Tabs (Amlodipine besylate) ..... One  daily  Complete Medication List: 1)  Viagra 50 Mg Tabs (Sildenafil citrate) 2)  Metformin Hcl 1000 Mg Tabs (Metformin hcl) .... Take 1 tablet by mouth two times a day 3)  Benazepril-hydrochlorothiazide 20-12.5 Mg Tabs (Benazepril-hydrochlorothiazide) .... One daily 4)  Amlodipine Besylate 5 Mg Tabs (Amlodipine besylate) .... One daily 5)  Neomycin-polymyxin-hc 3.5-10000-1 Soln (Neomycin-polymyxin-hc) .... 3 drops to the left ear 4 times daily  Patient Instructions: 1)  Please schedule a follow-up appointment in 4 months. 2)  Limit your Sodium (Salt). 3)  It is important that you exercise regularly at least 20 minutes 5 times a week. If you develop chest pain, have severe difficulty breathing, or feel very tired , stop exercising immediately and seek medical attention. 4)  Check your blood sugars regularly. If your readings are usually above : or below 70 you should contact our office. 5)  It is important that your Diabetic A1c level is checked every 3 months. 6)  Check your Blood Pressure regularly. If it is above: 140/90  you should make an appointment. Prescriptions: NEOMYCIN-POLYMYXIN-HC 3.5-10000-1 SOLN (NEOMYCIN-POLYMYXIN-HC) 3 drops to the left ear 4 times daily  #10 cc x 1   Entered and Authorized by:   Gordy Savers  MD   Signed by:   Gordy Savers  MD on 11/28/2008   Method used:   Print then Give to Patient   RxID:   [id number]

## 2010-05-31 ENCOUNTER — Encounter: Payer: Self-pay | Admitting: Internal Medicine

## 2010-05-31 ENCOUNTER — Ambulatory Visit (INDEPENDENT_AMBULATORY_CARE_PROVIDER_SITE_OTHER): Payer: 59 | Admitting: Internal Medicine

## 2010-05-31 DIAGNOSIS — E119 Type 2 diabetes mellitus without complications: Secondary | ICD-10-CM

## 2010-05-31 DIAGNOSIS — I10 Essential (primary) hypertension: Secondary | ICD-10-CM

## 2010-05-31 NOTE — Patient Instructions (Signed)
It is important that you exercise regularly, at least 20 minutes 3 to 4 times per week.  If you develop chest pain or shortness of breath seek  medical attention.  Please check your hemoglobin A1c every 3 months Limit your sodium (Salt) intake  Return in one month for follow-up  Please check your blood pressure on a regular basis.  If it is consistently greater than 150/90, please make an office appointment.  Victoza 0.6 mg daily for 2 weeks, the 1.2 mg daily

## 2010-05-31 NOTE — Progress Notes (Signed)
  Subjective:    Patient ID: Justin [name], male    DOB: 23-Nov-1955, 55 y.o.   MRN: [id number]  HPI  55 year old patient who has a history of type 2 diabetes.  He has maintained very nice glycemic control until approximately 3 months ago when his hemoglobin A1c was 7.2.  Prior to this, hemoglobin A1c's have been consistently less than 7.  More recently, he has noted elevated fasting blood sugars in the 180 range.  He notes occasional postprandial blood sugars in excess of 200.  There's been no change in his weight eating habits or exercise regimen. He has a history of hypertension, which has been well controlled on triple therapy. He feels that his diabetic control has 4 since the fall.  He was treated for acute prostatitis.    Review of Systems  Constitutional: Negative for fever, chills, appetite change and fatigue.  HENT: Negative for hearing loss, ear pain, congestion, sore throat, trouble swallowing, neck stiffness, dental problem, voice change and tinnitus.   Eyes: Negative for pain, discharge and visual disturbance.  Respiratory: Negative for cough, chest tightness, wheezing and stridor.   Cardiovascular: Negative for chest pain, palpitations and leg swelling.  Gastrointestinal: Negative for nausea, vomiting, abdominal pain, diarrhea, constipation, blood in stool and abdominal distention.  Genitourinary: Negative for urgency, hematuria, flank pain, discharge, difficulty urinating and genital sores.  Musculoskeletal: Negative for myalgias, back pain, joint swelling, arthralgias and gait problem.  Skin: Negative for rash.  Neurological: Negative for dizziness, syncope, speech difficulty, weakness, numbness and headaches.  Hematological: Negative for adenopathy. Does not bruise/bleed easily.  Psychiatric/Behavioral: Negative for behavioral problems and dysphoric mood. The patient is not nervous/anxious.        Objective:   Physical Exam  Constitutional: He is oriented to person,  place, and time. He appears well-developed.  HENT:  Head: Normocephalic.  Right Ear: External ear normal.  Left Ear: External ear normal.  Eyes: Conjunctivae and EOM are normal.       Fundi normal  Neck: Normal range of motion.  Cardiovascular: Normal rate and normal heart sounds.   Pulmonary/Chest: Breath sounds normal.  Abdominal: Bowel sounds are normal.  Musculoskeletal: Normal range of motion. He exhibits no edema and no tenderness.  Neurological: He is alert and oriented to person, place, and time.  Psychiatric: He has a normal mood and affect. His behavior is normal.          Assessment & Plan:  Diabetes mellitus-suboptimal control.  Will check a hemoglobin A1c today and if it is elevated as expected, will place on Victoza; all therapeutic second line options were discussed.  He will check with his insurance carrier about coverage.  Lifestyle issues discussed Hypertension stable

## 2010-06-01 ENCOUNTER — Telehealth: Payer: Self-pay | Admitting: Internal Medicine

## 2010-06-01 DIAGNOSIS — E119 Type 2 diabetes mellitus without complications: Secondary | ICD-10-CM

## 2010-06-01 LAB — HEMOGLOBIN A1C: Hgb A1c MFr Bld: 7.3 % — ABNORMAL HIGH (ref 4.6–6.5)

## 2010-06-01 NOTE — Telephone Encounter (Signed)
Pt would like a return call to discuss medications /  Receive a1c level...Marland KitchenMarland Kitchen Pt can be reached at [phone].

## 2010-06-01 NOTE — Telephone Encounter (Signed)
Please advise about lab -

## 2010-06-29 ENCOUNTER — Other Ambulatory Visit: Payer: Self-pay

## 2010-06-29 ENCOUNTER — Encounter: Payer: Self-pay | Admitting: Internal Medicine

## 2010-06-29 ENCOUNTER — Ambulatory Visit (INDEPENDENT_AMBULATORY_CARE_PROVIDER_SITE_OTHER): Payer: 59 | Admitting: Internal Medicine

## 2010-06-29 VITALS — BP 136/76 | Temp 98.1°F | Wt 184.0 lb

## 2010-06-29 DIAGNOSIS — E119 Type 2 diabetes mellitus without complications: Secondary | ICD-10-CM

## 2010-06-29 MED ORDER — EXENATIDE 10 MCG/0.04ML ~~LOC~~ SOPN: 10.0000 ug | PEN_INJECTOR | Freq: Two times a day (BID) | SUBCUTANEOUS | Status: DC

## 2010-06-29 MED ORDER — INSULIN PEN NEEDLE 31G X 8 MM MISC: 1.0000 | Freq: Two times a day (BID) | Status: DC

## 2010-06-29 NOTE — Patient Instructions (Signed)
Please check your hemoglobin A1c every 3 months  Return in 3 months for follow-up  

## 2010-06-29 NOTE — Progress Notes (Signed)
  Subjective:    Patient ID: FAOLAN SPRINGFIELD, male    DOB: 18-Jan-1956, 55 y.o.   MRN: [id number]  HPI  55 year old patient who is seen today for followup of his type 2 diabetes. He was placed on Victoza  One month ago due to inadequate control on oral medications. He tolerated this medication quite well. He has checked with insurance, but  Insurance plan  Requires Byetta. We'll switch to this injectable.    Review of Systems  Constitutional: Negative for fever, chills, appetite change and fatigue.  HENT: Negative for hearing loss, ear pain, congestion, sore throat, trouble swallowing, neck stiffness, dental problem, voice change and tinnitus.   Eyes: Negative for pain, discharge and visual disturbance.  Respiratory: Negative for cough, chest tightness, wheezing and stridor.   Cardiovascular: Negative for chest pain, palpitations and leg swelling.  Gastrointestinal: Negative for nausea, vomiting, abdominal pain, diarrhea, constipation, blood in stool and abdominal distention.  Genitourinary: Negative for urgency, hematuria, flank pain, discharge, difficulty urinating and genital sores.  Musculoskeletal: Negative for myalgias, back pain, joint swelling, arthralgias and gait problem.  Skin: Negative for rash.  Neurological: Negative for dizziness, syncope, speech difficulty, weakness, numbness and headaches.  Hematological: Negative for adenopathy. Does not bruise/bleed easily.  Psychiatric/Behavioral: Negative for behavioral problems and dysphoric mood. The patient is not nervous/anxious.        Objective:   Physical Exam  Constitutional: He appears well-developed. No distress.       bp  Normal. Weight stable          Assessment & Plan:   diabetes mellitus improved. We'll recheck in 3 months and followup hemoglobin A1c at that time. Will call [employer] and deal with preauthorization issues

## 2010-07-06 ENCOUNTER — Telehealth: Payer: Self-pay | Admitting: Internal Medicine

## 2010-07-06 NOTE — Telephone Encounter (Signed)
Byetta 10 mcg twice daily    (SQ)

## 2010-07-06 NOTE — Telephone Encounter (Signed)
Please advise 

## 2010-07-06 NOTE — Telephone Encounter (Signed)
Pharmacist called re: Byetta inj pen.  Need clarification on directions.

## 2010-07-06 NOTE — Telephone Encounter (Signed)
Called [facility]  kik

## 2010-07-10 ENCOUNTER — Telehealth: Payer: Self-pay | Admitting: Internal Medicine

## 2010-07-10 NOTE — Telephone Encounter (Signed)
Pt called and that [facility] contacted pt on Saturday, and said that the order for Byetta pen is still on hold, because more info is needed from pts pcp. Pls call [facility] [phone] re: script # [id number]

## 2010-07-10 NOTE — Telephone Encounter (Signed)
Called pt - ans mach - LMTCB if problems , i spoke with [facility] and gave clarification on byetta- should be no problems KIK

## 2010-09-25 ENCOUNTER — Ambulatory Visit (INDEPENDENT_AMBULATORY_CARE_PROVIDER_SITE_OTHER): Payer: 59 | Admitting: Internal Medicine

## 2010-09-25 ENCOUNTER — Encounter: Payer: Self-pay | Admitting: Internal Medicine

## 2010-09-25 DIAGNOSIS — I10 Essential (primary) hypertension: Secondary | ICD-10-CM

## 2010-09-25 DIAGNOSIS — E119 Type 2 diabetes mellitus without complications: Secondary | ICD-10-CM

## 2010-09-25 LAB — HEMOGLOBIN A1C: Hgb A1c MFr Bld: 6.9 % — ABNORMAL HIGH (ref 4.6–6.5)

## 2010-09-25 NOTE — Patient Instructions (Signed)
Please check your hemoglobin A1c every 3 months    It is important that you exercise regularly, at least 20 minutes 3 to 4 times per week.  If you develop chest pain or shortness of breath seek  medical attention.   

## 2010-09-25 NOTE — Progress Notes (Signed)
  Subjective:    Patient ID: Justin [name], male    DOB: October 08, 1955, 55 y.o.   MRN: [id number]  HPI 55 year old patient who is seen today for followup. He is doing quite well on his present regimen. Due to insurance issues was switched to Byetta. He has had much more nausea and GI symptoms but these apparently have resolved over the past few days. He is maintained nice glycemic control. He has had a recent eye exam. Blood pressure has been well-controlled    Review of Systems  Gastrointestinal: Positive for nausea.       Objective:   Physical Exam  Constitutional: He is oriented to person, place, and time. He appears well-developed.  HENT:  Head: Normocephalic.  Right Ear: External ear normal.  Left Ear: External ear normal.  Eyes: Conjunctivae and EOM are normal.  Neck: Normal range of motion.  Cardiovascular: Normal rate and normal heart sounds.   Pulmonary/Chest: Breath sounds normal.  Abdominal: Bowel sounds are normal.  Musculoskeletal: Normal range of motion. He exhibits no edema and no tenderness.  Neurological: He is alert and oriented to person, place, and time.  Psychiatric: He has a normal mood and affect. His behavior is normal.          Assessment & Plan:    Diabetes mellitus. We'll check a hemoglobin A1c. Hypertension well controlled. We'll continue present regimen.

## 2010-09-25 NOTE — Progress Notes (Signed)
Quick Note:  Spoke with pt - informed of lab results ______

## 2010-12-26 ENCOUNTER — Encounter: Payer: Self-pay | Admitting: Internal Medicine

## 2010-12-26 ENCOUNTER — Ambulatory Visit (INDEPENDENT_AMBULATORY_CARE_PROVIDER_SITE_OTHER): Payer: 59 | Admitting: Internal Medicine

## 2010-12-26 VITALS — BP 118/64 | Temp 98.0°F | Wt 182.0 lb

## 2010-12-26 DIAGNOSIS — Z23 Encounter for immunization: Secondary | ICD-10-CM

## 2010-12-26 DIAGNOSIS — E119 Type 2 diabetes mellitus without complications: Secondary | ICD-10-CM

## 2010-12-26 DIAGNOSIS — Z0000 Encounter for general adult medical examination without abnormal findings: Secondary | ICD-10-CM

## 2010-12-26 DIAGNOSIS — I10 Essential (primary) hypertension: Secondary | ICD-10-CM

## 2010-12-26 LAB — HEMOGLOBIN A1C: Hgb A1c MFr Bld: 7 % — ABNORMAL HIGH (ref 4.6–6.5)

## 2010-12-26 NOTE — Patient Instructions (Signed)
It is important that you exercise regularly, at least 20 minutes 3 to 4 times per week.  If you develop chest pain or shortness of breath seek  medical attention.   Please check your hemoglobin A1c every 3 months   

## 2010-12-27 ENCOUNTER — Encounter: Payer: Self-pay | Admitting: Internal Medicine

## 2010-12-27 NOTE — Progress Notes (Signed)
  Subjective:    Patient ID: Justin [name], male    DOB: 11-01-1955, 55 y.o.   MRN: [id number]  HPI55 -year-old patient who is in today for followup. He has a history of type 2 diabetes he has been compliant with his medications and seems to have maintained a nice glycemic control. He has treated hypertension. He tolerates his medications well without difficulty. He has a history of allergic rhinitis and remote history of colonic polyps. No new concerns or complaints today.    Review of Systems  Constitutional: Negative for fever, chills, appetite change and fatigue.  HENT: Negative for hearing loss, ear pain, congestion, sore throat, trouble swallowing, neck stiffness, dental problem, voice change and tinnitus.   Eyes: Negative for pain, discharge and visual disturbance.  Respiratory: Negative for cough, chest tightness, wheezing and stridor.   Cardiovascular: Negative for chest pain, palpitations and leg swelling.  Gastrointestinal: Negative for nausea, vomiting, abdominal pain, diarrhea, constipation, blood in stool and abdominal distention.  Genitourinary: Negative for urgency, hematuria, flank pain, discharge, difficulty urinating and genital sores.  Musculoskeletal: Negative for myalgias, back pain, joint swelling, arthralgias and gait problem.  Skin: Negative for rash.  Neurological: Negative for dizziness, syncope, speech difficulty, weakness, numbness and headaches.  Hematological: Negative for adenopathy. Does not bruise/bleed easily.  Psychiatric/Behavioral: Negative for behavioral problems and dysphoric mood. The patient is not nervous/anxious.        Objective:   Physical Exam  Constitutional: He is oriented to person, place, and time. He appears well-developed.  HENT:  Head: Normocephalic.  Right Ear: External ear normal.  Left Ear: External ear normal.  Eyes: Conjunctivae and EOM are normal.  Neck: Normal range of motion.  Cardiovascular: Normal rate and normal heart  sounds.   Pulmonary/Chest: Breath sounds normal.  Abdominal: Bowel sounds are normal.  Musculoskeletal: Normal range of motion. He exhibits no edema and no tenderness.  Neurological: He is alert and oriented to person, place, and time.  Psychiatric: He has a normal mood and affect. His behavior is normal.          Assessment & Plan:    Diabetes mellitus. We'll check a hemoglobin A1c exercise proper diet encouraged he will continue home blood sugar monitoring Hypertension well controlled. We'll continue his present regimen exercise low salt diet on current  Recheck 3 months

## 2011-03-25 ENCOUNTER — Other Ambulatory Visit: Payer: Self-pay | Admitting: Internal Medicine

## 2011-03-27 ENCOUNTER — Encounter: Payer: Self-pay | Admitting: Internal Medicine

## 2011-03-27 ENCOUNTER — Ambulatory Visit (INDEPENDENT_AMBULATORY_CARE_PROVIDER_SITE_OTHER): Payer: 59 | Admitting: Internal Medicine

## 2011-03-27 DIAGNOSIS — I10 Essential (primary) hypertension: Secondary | ICD-10-CM

## 2011-03-27 DIAGNOSIS — K625 Hemorrhage of anus and rectum: Secondary | ICD-10-CM

## 2011-03-27 DIAGNOSIS — E119 Type 2 diabetes mellitus without complications: Secondary | ICD-10-CM

## 2011-03-27 LAB — HEMOGLOBIN A1C: Hgb A1c MFr Bld: 7 % — ABNORMAL HIGH (ref 4.6–6.5)

## 2011-03-27 MED ORDER — HYDROCORTISONE 2.5 % RE CREA: TOPICAL_CREAM | RECTAL | Status: AC

## 2011-03-27 MED ORDER — EXENATIDE ER 2 MG ~~LOC~~ SUSR: 2.0000 mg | SUBCUTANEOUS | Status: DC

## 2011-03-27 NOTE — Patient Instructions (Signed)
Please check your hemoglobin A1c every 3 months    It is important that you exercise regularly, at least 20 minutes 3 to 4 times per week.  If you develop chest pain or shortness of breath seek  medical attention.   

## 2011-03-27 NOTE — Progress Notes (Signed)
  Subjective:    Patient ID: Justin [name], male    DOB: 04-Apr-1956, 55 y.o.   MRN: [id number]  HPI  55 year old patient who is in today for followup of his type 2 diabetes. This has done well. His last hemoglobin A1c 7.0 he maintains a nice glycemic control. He has treated hypertension which has been stable. Medical concerns include facial dry skin and occasional intermittent rectal pain and rectal bleeding. He does have a history of colonic polyps and does have regular colonoscopies.    Review of Systems  Constitutional: Negative for fever, chills, appetite change and fatigue.  HENT: Negative for hearing loss, ear pain, congestion, sore throat, trouble swallowing, neck stiffness, dental problem, voice change and tinnitus.   Eyes: Negative for pain, discharge and visual disturbance.  Respiratory: Negative for cough, chest tightness, wheezing and stridor.   Cardiovascular: Negative for chest pain, palpitations and leg swelling.  Gastrointestinal: Positive for blood in stool. Negative for nausea, vomiting, abdominal pain, diarrhea, constipation and abdominal distention.  Genitourinary: Negative for urgency, hematuria, flank pain, discharge, difficulty urinating and genital sores.  Musculoskeletal: Negative for myalgias, back pain, joint swelling, arthralgias and gait problem.  Skin: Positive for rash (facial dry skin).  Neurological: Negative for dizziness, syncope, speech difficulty, weakness, numbness and headaches.  Hematological: Negative for adenopathy. Does not bruise/bleed easily.  Psychiatric/Behavioral: Negative for behavioral problems and dysphoric mood. The patient is not nervous/anxious.        Objective:   Physical Exam  Constitutional: He is oriented to person, place, and time. He appears well-developed.  HENT:  Head: Normocephalic.  Right Ear: External ear normal.  Left Ear: External ear normal.  Eyes: Conjunctivae and EOM are normal.  Neck: Normal range of motion.    Cardiovascular: Normal rate and normal heart sounds.   Pulmonary/Chest: Breath sounds normal.  Abdominal: Bowel sounds are normal.  Musculoskeletal: Normal range of motion. He exhibits no edema and no tenderness.  Neurological: He is alert and oriented to person, place, and time.  Psychiatric: He has a normal mood and affect. His behavior is normal.          Assessment & Plan:   Diabetes mellitus. Well controlled the patient wishes to consider weekly GLP-1 agonist Hypertension stable Intermittent rectal bleeding. Probable internal hemorrhoid. A followup colonoscopy next year as planned we'll treat symptomatically

## 2011-04-07 ENCOUNTER — Other Ambulatory Visit: Payer: Self-pay | Admitting: Internal Medicine

## 2011-05-09 ENCOUNTER — Other Ambulatory Visit: Payer: Self-pay | Admitting: Internal Medicine

## 2011-06-27 ENCOUNTER — Ambulatory Visit (INDEPENDENT_AMBULATORY_CARE_PROVIDER_SITE_OTHER): Payer: 59 | Admitting: Internal Medicine

## 2011-06-27 ENCOUNTER — Encounter: Payer: Self-pay | Admitting: Internal Medicine

## 2011-06-27 VITALS — BP 110/70 | Temp 97.9°F | Wt 181.0 lb

## 2011-06-27 DIAGNOSIS — I10 Essential (primary) hypertension: Secondary | ICD-10-CM

## 2011-06-27 DIAGNOSIS — E119 Type 2 diabetes mellitus without complications: Secondary | ICD-10-CM

## 2011-06-27 LAB — HEMOGLOBIN A1C: Hgb A1c MFr Bld: 7.1 % — ABNORMAL HIGH (ref 4.6–6.5)

## 2011-06-27 NOTE — Progress Notes (Signed)
  Subjective:    Patient ID: Justin [name], male    DOB: November 17, 1955, 56 y.o.   MRN: [id number]  HPI    56 year old patient seen today for followup of his type 2 diabetes. He is doing quite well. He had some GI intolerance with byetta but seems to be tolerating bydureon without difficulty. He has treated hypertension which has been controlled  Review of Systems  Constitutional: Negative for fever, chills, appetite change and fatigue.  HENT: Negative for hearing loss, ear pain, congestion, sore throat, trouble swallowing, neck stiffness, dental problem, voice change and tinnitus.   Eyes: Negative for pain, discharge and visual disturbance.  Respiratory: Negative for cough, chest tightness, wheezing and stridor.   Cardiovascular: Negative for chest pain, palpitations and leg swelling.  Gastrointestinal: Negative for nausea, vomiting, abdominal pain, diarrhea, constipation, blood in stool and abdominal distention.  Genitourinary: Negative for urgency, hematuria, flank pain, discharge, difficulty urinating and genital sores.  Musculoskeletal: Negative for myalgias, back pain, joint swelling, arthralgias and gait problem.  Skin: Negative for rash.  Neurological: Negative for dizziness, syncope, speech difficulty, weakness, numbness and headaches.  Hematological: Negative for adenopathy. Does not bruise/bleed easily.  Psychiatric/Behavioral: Negative for behavioral problems and dysphoric mood. The patient is not nervous/anxious.        Objective:   Physical Exam  Constitutional: He is oriented to person, place, and time. He appears well-developed.  HENT:  Head: Normocephalic.  Right Ear: External ear normal.  Left Ear: External ear normal.  Eyes: Conjunctivae and EOM are normal.  Neck: Normal range of motion.  Cardiovascular: Normal rate and normal heart sounds.   Pulmonary/Chest: Breath sounds normal.  Abdominal: Bowel sounds are normal.  Musculoskeletal: Normal range of motion. He  exhibits no edema and no tenderness.  Neurological: He is alert and oriented to person, place, and time.  Psychiatric: He has a normal mood and affect. His behavior is normal.          Assessment & Plan:   Diabetes mellitus. Well-controlled. Will check a hemoglobin A1c Hypertension well controlled will continue same   Recheck 3 months

## 2011-06-27 NOTE — Progress Notes (Signed)
  Subjective:    Patient ID: Justin [name], male    DOB: May 15, 1955, 56 y.o.   MRN: [id number]  HPI  Wt Readings from Last 3 Encounters:  06/27/11 181 lb (82.101 kg)  03/27/11 180 lb (81.647 kg)  12/26/10 182 lb (82.555 kg)    Review of Systems     Objective:   Physical Exam        Assessment & Plan:

## 2011-06-27 NOTE — Patient Instructions (Signed)
Limit your sodium (Salt) intake   Please check your hemoglobin A1c every 3 months    It is important that you exercise regularly, at least 20 minutes 3 to 4 times per week.  If you develop chest pain or shortness of breath seek  medical attention.  Please see your eye doctor yearly to check for diabetic eye damage  

## 2011-07-15 ENCOUNTER — Other Ambulatory Visit: Payer: Self-pay | Admitting: Internal Medicine

## 2011-09-27 ENCOUNTER — Ambulatory Visit: Payer: 59 | Admitting: Internal Medicine

## 2011-09-28 ENCOUNTER — Other Ambulatory Visit: Payer: Self-pay | Admitting: Internal Medicine

## 2011-10-02 ENCOUNTER — Encounter: Payer: Self-pay | Admitting: Internal Medicine

## 2011-10-02 ENCOUNTER — Ambulatory Visit (INDEPENDENT_AMBULATORY_CARE_PROVIDER_SITE_OTHER): Payer: 59 | Admitting: Internal Medicine

## 2011-10-02 VITALS — BP 120/80 | Temp 97.7°F | Wt 184.0 lb

## 2011-10-02 DIAGNOSIS — I10 Essential (primary) hypertension: Secondary | ICD-10-CM

## 2011-10-02 DIAGNOSIS — E119 Type 2 diabetes mellitus without complications: Secondary | ICD-10-CM

## 2011-10-02 LAB — HEMOGLOBIN A1C: Hgb A1c MFr Bld: 6.8 % — ABNORMAL HIGH (ref 4.6–6.5)

## 2011-10-02 MED ORDER — GLUCOSE BLOOD VI STRP: 1.0000 | ORAL_STRIP | Freq: Two times a day (BID) | Status: DC

## 2011-10-02 NOTE — Progress Notes (Signed)
  Subjective:    Patient ID: Justin [name], male    DOB: 1956/02/23, 56 y.o.   MRN: [id number]  HPI  56 year old patient who has diabetes and hypertension and is seen today for followup. He is doing quite well. Hemoglobin A1c as have been right at 7.0 for one year. His glycemic control is about the same. No concerns or complaints.   Review of Systems  Constitutional: Negative for fever, chills, appetite change and fatigue.  HENT: Negative for hearing loss, ear pain, congestion, sore throat, trouble swallowing, neck stiffness, dental problem, voice change and tinnitus.   Eyes: Negative for pain, discharge and visual disturbance.  Respiratory: Negative for cough, chest tightness, wheezing and stridor.   Cardiovascular: Negative for chest pain, palpitations and leg swelling.  Gastrointestinal: Negative for nausea, vomiting, abdominal pain, diarrhea, constipation, blood in stool and abdominal distention.  Genitourinary: Negative for urgency, hematuria, flank pain, discharge, difficulty urinating and genital sores.  Musculoskeletal: Negative for myalgias, back pain, joint swelling, arthralgias and gait problem.  Skin: Negative for rash.  Neurological: Negative for dizziness, syncope, speech difficulty, weakness, numbness and headaches.  Hematological: Negative for adenopathy. Does not bruise/bleed easily.  Psychiatric/Behavioral: Negative for behavioral problems and dysphoric mood. The patient is not nervous/anxious.        Objective:   Physical Exam  Constitutional: He is oriented to person, place, and time. He appears well-developed.  HENT:  Head: Normocephalic.  Right Ear: External ear normal.  Left Ear: External ear normal.  Eyes: Conjunctivae and EOM are normal.  Neck: Normal range of motion.  Cardiovascular: Normal rate and normal heart sounds.   Pulmonary/Chest: Breath sounds normal.  Abdominal: Bowel sounds are normal.  Musculoskeletal: Normal range of motion. He exhibits no  edema and no tenderness.  Neurological: He is alert and oriented to person, place, and time.  Psychiatric: He has a normal mood and affect. His behavior is normal.          Assessment & Plan:   Diabetes mellitus. We'll check a hemoglobin A1c. Diabetic eye exam scheduled for tomorrow hypertension stable  Recheck 3 months Regular exercise encouraged

## 2011-10-02 NOTE — Patient Instructions (Signed)
Limit your sodium (Salt) intake   Please check your hemoglobin A1c every 3 months    It is important that you exercise regularly, at least 20 minutes 3 to 4 times per week.  If you develop chest pain or shortness of breath seek  medical attention.   

## 2011-10-04 ENCOUNTER — Other Ambulatory Visit: Payer: Self-pay | Admitting: Internal Medicine

## 2011-10-04 MED ORDER — EXENATIDE ER 2 MG ~~LOC~~ SUSR: 2.0000 mg | SUBCUTANEOUS | Status: DC

## 2011-10-04 NOTE — Telephone Encounter (Signed)
Caller: Justin [name]/Patient; PCP: Eleonore Chiquito; CB#: [phone]; ; ; Call regarding Rx Refill for [facility]. Seen in office 10/02/11 and thought all Rx was up to date, but notes that his  exenatide 2mg  susr, 1 injection sub Q once weekly has no further refills.  Has just ordered last refill of this, and needs new Rx sent to [facility] as in chart.   INFO TO OFFICE FOR PROVIDER REVIEW/RX/CALLBACK. MAY REACH PATIENT AT [phone].

## 2011-10-04 NOTE — Telephone Encounter (Signed)
done

## 2012-01-02 ENCOUNTER — Ambulatory Visit (INDEPENDENT_AMBULATORY_CARE_PROVIDER_SITE_OTHER): Payer: 59 | Admitting: Internal Medicine

## 2012-01-02 ENCOUNTER — Encounter: Payer: Self-pay | Admitting: Internal Medicine

## 2012-01-02 VITALS — BP 130/86 | Temp 97.7°F | Wt 183.0 lb

## 2012-01-02 DIAGNOSIS — I10 Essential (primary) hypertension: Secondary | ICD-10-CM

## 2012-01-02 DIAGNOSIS — E119 Type 2 diabetes mellitus without complications: Secondary | ICD-10-CM

## 2012-01-02 DIAGNOSIS — Z23 Encounter for immunization: Secondary | ICD-10-CM

## 2012-01-02 LAB — HEMOGLOBIN A1C: Hgb A1c MFr Bld: 6.4 % (ref 4.6–6.5)

## 2012-01-02 NOTE — Patient Instructions (Addendum)
Limit your sodium (Salt) intake   Please check your hemoglobin A1c every 3 months   

## 2012-01-02 NOTE — Progress Notes (Signed)
Subjective:    Patient ID: Justin [name], male    DOB: 1956-03-17, 56 y.o.   MRN: [id number]  HPI  56 year old patient who is seen today for followup of his type 2 diabetes and hypertension. He is doing quite well. Last hemoglobin A1c 6.9. No concerns or complaints  Past Medical History  Diagnosis Date  . Allergy   . Hypertension   . Diabetes mellitus   . ED (erectile dysfunction)   . History of colonic polyps     History   Social History  . Marital Status: Married    Spouse Name: N/A    Number of Children: N/A  . Years of Education: N/A   Occupational History  . Not on file.   Social History Main Topics  . Smoking status: Never Smoker   . Smokeless tobacco: Never Used  . Alcohol Use: No  . Drug Use: No  . Sexually Active: Not on file   Other Topics Concern  . Not on file   Social History Narrative  . No narrative on file    No past surgical history on file.  Family History  Problem Relation Age of Onset  . Hypertension Mother   . Stroke Mother   . Dementia Father   . Hypertension Sister   . Diabetes Brother   . Hypertension Brother   . Cerebral palsy Daughter   . Cerebral palsy Daughter     No Known Allergies  Current Outpatient Prescriptions on File Prior to Visit  Medication Sig Dispense Refill  . amLODipine (NORVASC) 5 MG tablet TAKE 1 TABLET DAILY  90 tablet  5  . benazepril-hydrochlorthiazide (LOTENSIN HCT) 20-12.5 MG per tablet TAKE 1 TABLET DAILY  90 tablet  3  . Blood Glucose Monitoring Suppl (ACCU-CHEK AVIVA) kit by Other route. Use as instructed       . Exenatide (BYDUREON) 2 MG SUSR Inject 2 mg into the skin once a week.  3 each  6  . glucose blood (ACCU-CHEK AVIVA) test strip 1 each by Other route 2 (two) times daily. Dx code 250.00  300 each  3  . hydrocortisone (ANUSOL-HC) 2.5 % rectal cream Apply rectally 2 times daily  30 g  3  . Insulin Pen Needle (B-D ULTRAFINE III SHORT PEN) 31G X 8 MM MISC 1 each by Does not apply route 2 (two)  times daily.  100 each  6  . Lancets (ACCU-CHEK MULTICLIX) lancets 1 each by Other route 2 (two) times daily. Use as instructed       . metFORMIN (GLUCOPHAGE) 1000 MG tablet TAKE 1 TABLET TWICE A DAY  180 tablet  1  . sildenafil (VIAGRA) 100 MG tablet Take 100 mg by mouth daily as needed.          BP 130/86  Temp 97.7 F (36.5 C) (Oral)  Wt 183 lb (83.008 kg)       Review of Systems  Constitutional: Negative for fever, chills, appetite change and fatigue.  HENT: Negative for hearing loss, ear pain, congestion, sore throat, trouble swallowing, neck stiffness, dental problem, voice change and tinnitus.   Eyes: Negative for pain, discharge and visual disturbance.  Respiratory: Negative for cough, chest tightness, wheezing and stridor.   Cardiovascular: Negative for chest pain, palpitations and leg swelling.  Gastrointestinal: Negative for nausea, vomiting, abdominal pain, diarrhea, constipation, blood in stool and abdominal distention.  Genitourinary: Negative for urgency, hematuria, flank pain, discharge, difficulty urinating and genital sores.  Musculoskeletal: Negative for myalgias, back  pain, joint swelling, arthralgias and gait problem.  Skin: Negative for rash.  Neurological: Negative for dizziness, syncope, speech difficulty, weakness, numbness and headaches.  Hematological: Negative for adenopathy. Does not bruise/bleed easily.  Psychiatric/Behavioral: Negative for behavioral problems and dysphoric mood. The patient is not nervous/anxious.        Objective:   Physical Exam  Constitutional: He is oriented to person, place, and time. He appears well-developed.       Blood pressure 126/72  HENT:  Head: Normocephalic.  Right Ear: External ear normal.  Left Ear: External ear normal.  Eyes: Conjunctivae normal and EOM are normal.  Neck: Normal range of motion.  Cardiovascular: Normal rate and normal heart sounds.   Pulmonary/Chest: Breath sounds normal.  Abdominal: Bowel  sounds are normal.  Musculoskeletal: Normal range of motion. He exhibits no edema and no tenderness.  Neurological: He is alert and oriented to person, place, and time.  Psychiatric: He has a normal mood and affect. His behavior is normal.          Assessment & Plan:   Hypertension well controlled. We'll continue restricted salt diet Diabetes mellitus. Appears to be stable we'll check hemoglobin A1c. Continue present regimen  Recheck 3-4 months

## 2012-05-04 ENCOUNTER — Encounter: Payer: Self-pay | Admitting: Internal Medicine

## 2012-05-04 ENCOUNTER — Ambulatory Visit (INDEPENDENT_AMBULATORY_CARE_PROVIDER_SITE_OTHER): Payer: 59 | Admitting: Internal Medicine

## 2012-05-04 VITALS — BP 130/80 | HR 88 | Temp 97.7°F | Resp 18 | Wt 183.8 lb

## 2012-05-04 DIAGNOSIS — I10 Essential (primary) hypertension: Secondary | ICD-10-CM

## 2012-05-04 DIAGNOSIS — Z23 Encounter for immunization: Secondary | ICD-10-CM

## 2012-05-04 DIAGNOSIS — E119 Type 2 diabetes mellitus without complications: Secondary | ICD-10-CM

## 2012-05-04 LAB — HEMOGLOBIN A1C: Hgb A1c MFr Bld: 7 % — ABNORMAL HIGH (ref 4.6–6.5)

## 2012-05-04 MED ORDER — ONETOUCH DELICA LANCETS FINE MISC: 1.0000 | Freq: Two times a day (BID) | Status: AC

## 2012-05-04 MED ORDER — GLUCOSE BLOOD VI STRP: ORAL_STRIP | Status: DC

## 2012-05-04 MED ORDER — SILDENAFIL CITRATE 100 MG PO TABS: 100.0000 mg | ORAL_TABLET | Freq: Every day | ORAL | Status: DC | PRN

## 2012-05-04 NOTE — Progress Notes (Signed)
Subjective:    Patient ID: Justin [name], male    DOB: 1955-04-21, 57 y.o.   MRN: [id number]  HPI  57 year old patient who is seen today for followup of type 2 diabetes and hypertension. He is doing quite well. Fasting blood sugars tend to run a bit high in the morning but his last hemoglobin A1c was 6.4  4 months ago.  No complaints  Past Medical History  Diagnosis Date  . Allergy   . Hypertension   . Diabetes mellitus   . ED (erectile dysfunction)   . History of colonic polyps     History   Social History  . Marital Status: Married    Spouse Name: N/A    Number of Children: N/A  . Years of Education: N/A   Occupational History  . Not on file.   Social History Main Topics  . Smoking status: Never Smoker   . Smokeless tobacco: Never Used  . Alcohol Use: No  . Drug Use: No  . Sexually Active: Not on file   Other Topics Concern  . Not on file   Social History Narrative  . No narrative on file    No past surgical history on file.  Family History  Problem Relation Age of Onset  . Hypertension Mother   . Stroke Mother   . Dementia Father   . Hypertension Sister   . Diabetes Brother   . Hypertension Brother   . Cerebral palsy Daughter   . Cerebral palsy Daughter     No Known Allergies  Current Outpatient Prescriptions on File Prior to Visit  Medication Sig Dispense Refill  . amLODipine (NORVASC) 5 MG tablet TAKE 1 TABLET DAILY  90 tablet  5  . benazepril-hydrochlorthiazide (LOTENSIN HCT) 20-12.5 MG per tablet TAKE 1 TABLET DAILY  90 tablet  3  . Blood Glucose Monitoring Suppl (ACCU-CHEK AVIVA) kit by Other route. Use as instructed       . Exenatide (BYDUREON) 2 MG SUSR Inject 2 mg into the skin once a week.  3 each  6  . glucose blood (ACCU-CHEK AVIVA) test strip 1 each by Other route 2 (two) times daily. Dx code 250.00  300 each  3  . Lancets (ACCU-CHEK MULTICLIX) lancets 1 each by Other route 2 (two) times daily. Use as instructed       . metFORMIN  (GLUCOPHAGE) 1000 MG tablet TAKE 1 TABLET TWICE A DAY  180 tablet  1  . sildenafil (VIAGRA) 100 MG tablet Take 1 tablet (100 mg total) by mouth daily as needed.  10 tablet  6    BP 130/80  Pulse 88  Temp 97.7 F (36.5 C) (Oral)  Resp 18  Wt 183 lb 12.8 oz (83.371 kg)  SpO2 96%       Review of Systems  Constitutional: Negative for fever, chills, appetite change and fatigue.  HENT: Negative for hearing loss, ear pain, congestion, sore throat, trouble swallowing, neck stiffness, dental problem, voice change and tinnitus.   Eyes: Negative for pain, discharge and visual disturbance.  Respiratory: Negative for cough, chest tightness, wheezing and stridor.   Cardiovascular: Negative for chest pain, palpitations and leg swelling.  Gastrointestinal: Negative for nausea, vomiting, abdominal pain, diarrhea, constipation, blood in stool and abdominal distention.  Genitourinary: Negative for urgency, hematuria, flank pain, discharge, difficulty urinating and genital sores.  Musculoskeletal: Negative for myalgias, back pain, joint swelling, arthralgias and gait problem.  Skin: Negative for rash.  Neurological: Negative for dizziness, syncope, speech  difficulty, weakness, numbness and headaches.  Hematological: Negative for adenopathy. Does not bruise/bleed easily.  Psychiatric/Behavioral: Negative for behavioral problems and dysphoric mood. The patient is not nervous/anxious.        Objective:   Physical Exam  Constitutional: He is oriented to person, place, and time. He appears well-developed.  HENT:  Head: Normocephalic.  Right Ear: External ear normal.  Left Ear: External ear normal.  Eyes: Conjunctivae normal and EOM are normal.  Neck: Normal range of motion.  Cardiovascular: Normal rate and normal heart sounds.   Pulmonary/Chest: Breath sounds normal.  Abdominal: Bowel sounds are normal.  Musculoskeletal: Normal range of motion. He exhibits no edema and no tenderness.    Neurological: He is alert and oriented to person, place, and time.  Psychiatric: He has a normal mood and affect. His behavior is normal.          Assessment & Plan:  Hypertension well controlled Type 2 diabetes. We'll check a hemoglobin A1c  No  change in medication Recheck 4 months

## 2012-05-04 NOTE — Patient Instructions (Signed)
Please check your hemoglobin A1c every 3 months    It is important that you exercise regularly, at least 20 minutes 3 to 4 times per week.  If you develop chest pain or shortness of breath seek  medical attention.  Limit your sodium (Salt) intake   

## 2012-07-05 ENCOUNTER — Other Ambulatory Visit: Payer: Self-pay | Admitting: Internal Medicine

## 2012-09-01 ENCOUNTER — Encounter: Payer: Self-pay | Admitting: Internal Medicine

## 2012-09-01 ENCOUNTER — Ambulatory Visit (INDEPENDENT_AMBULATORY_CARE_PROVIDER_SITE_OTHER): Payer: 59 | Admitting: Internal Medicine

## 2012-09-01 VITALS — BP 120/80 | HR 102 | Temp 97.9°F | Resp 20 | Wt 184.0 lb

## 2012-09-01 DIAGNOSIS — E119 Type 2 diabetes mellitus without complications: Secondary | ICD-10-CM

## 2012-09-01 DIAGNOSIS — I10 Essential (primary) hypertension: Secondary | ICD-10-CM

## 2012-09-01 LAB — HEMOGLOBIN A1C: Hgb A1c MFr Bld: 7.1 % — ABNORMAL HIGH (ref 4.6–6.5)

## 2012-09-01 MED ORDER — BENAZEPRIL-HYDROCHLOROTHIAZIDE 20-12.5 MG PO TABS: ORAL_TABLET | ORAL | Status: DC

## 2012-09-01 MED ORDER — EXENATIDE ER 2 MG ~~LOC~~ SUSR: 2.0000 mg | SUBCUTANEOUS | Status: DC

## 2012-09-01 NOTE — Patient Instructions (Signed)
Limit your sodium (Salt) intake  Please check your blood pressure on a regular basis.  If it is consistently greater than 150/90, please make an office appointment.     It is important that you exercise regularly, at least 20 minutes 3 to 4 times per week.  If you develop chest pain or shortness of breath seek  medical attention. 

## 2012-09-01 NOTE — Progress Notes (Signed)
  Subjective:    Patient ID: Justin [name], male    DOB: Sep 18, 1955, 57 y.o.   MRN: [id number]  HPI  Wt Readings from Last 3 Encounters:  09/01/12 184 lb (83.462 kg)  05/04/12 183 lb 12.8 oz (83.371 kg)  01/02/12 183 lb (83.008 kg)    Review of Systems     Objective:   Physical Exam        Assessment & Plan:

## 2012-09-01 NOTE — Progress Notes (Signed)
Subjective:    Patient ID: Justin [name], male    DOB: 12-21-1955, 57 y.o.   MRN: [id number]  HPI 57 year old patient seen today for followup of hypertension and type 2 diabetes. He has done quite well on his present regimen. Blood pressure well controlled today. No concerns or complaints. His hemoglobin A1c increased from 6.4-7.0 over the winter holidays. He feels that he has more recently maintained improved glycemic control.  Past Medical History  Diagnosis Date  . Allergy   . Hypertension   . Diabetes mellitus   . ED (erectile dysfunction)   . History of colonic polyps     History   Social History  . Marital Status: Married    Spouse Name: N/A    Number of Children: N/A  . Years of Education: N/A   Occupational History  . Not on file.   Social History Main Topics  . Smoking status: Never Smoker   . Smokeless tobacco: Never Used  . Alcohol Use: No  . Drug Use: No  . Sexually Active: Not on file   Other Topics Concern  . Not on file   Social History Narrative  . No narrative on file    History reviewed. No pertinent past surgical history.  Family History  Problem Relation Age of Onset  . Hypertension Mother   . Stroke Mother   . Dementia Father   . Hypertension Sister   . Diabetes Brother   . Hypertension Brother   . Cerebral palsy Daughter   . Cerebral palsy Daughter     No Known Allergies  Current Outpatient Prescriptions on File Prior to Visit  Medication Sig Dispense Refill  . amLODipine (NORVASC) 5 MG tablet TAKE 1 TABLET DAILY  90 tablet  3  . glucose blood (ONETOUCH VERIO) test strip Use as instructed  100 each  12  . metFORMIN (GLUCOPHAGE) 1000 MG tablet TAKE 1 TABLET TWICE A DAY  180 tablet  3  . ONETOUCH DELICA LANCETS FINE MISC 1 each by Does not apply route 2 (two) times daily.  100 each  3  . sildenafil (VIAGRA) 100 MG tablet Take 1 tablet (100 mg total) by mouth daily as needed.  10 tablet  6   No current facility-administered  medications on file prior to visit.    BP 120/80  Pulse 102  Temp(Src) 97.9 F (36.6 C) (Oral)  Resp 20  Wt 184 lb (83.462 kg)  BMI 25.31 kg/m2  SpO2 99%         Review of Systems  Constitutional: Negative for fever, chills, appetite change and fatigue.  HENT: Negative for hearing loss, ear pain, congestion, sore throat, trouble swallowing, neck stiffness, dental problem, voice change and tinnitus.   Eyes: Negative for pain, discharge and visual disturbance.  Respiratory: Negative for cough, chest tightness, wheezing and stridor.   Cardiovascular: Negative for chest pain, palpitations and leg swelling.  Gastrointestinal: Negative for nausea, vomiting, abdominal pain, diarrhea, constipation, blood in stool and abdominal distention.  Genitourinary: Negative for urgency, hematuria, flank pain, discharge, difficulty urinating and genital sores.  Musculoskeletal: Negative for myalgias, back pain, joint swelling, arthralgias and gait problem.  Skin: Negative for rash.  Neurological: Negative for dizziness, syncope, speech difficulty, weakness, numbness and headaches.  Hematological: Negative for adenopathy. Does not bruise/bleed easily.  Psychiatric/Behavioral: Negative for behavioral problems and dysphoric mood. The patient is not nervous/anxious.        Objective:   Physical Exam  Constitutional: He is oriented to person,  place, and time. He appears well-developed.  HENT:  Head: Normocephalic.  Right Ear: External ear normal.  Left Ear: External ear normal.  Eyes: Conjunctivae and EOM are normal.  Neck: Normal range of motion.  Cardiovascular: Normal rate and normal heart sounds.   Pulmonary/Chest: Breath sounds normal.  Abdominal: Bowel sounds are normal.  Musculoskeletal: Normal range of motion. He exhibits no edema and no tenderness.  Neurological: He is alert and oriented to person, place, and time.  Psychiatric: He has a normal mood and affect. His behavior is normal.           Assessment & Plan:  Diabetes mellitus. Will check a hemoglobin A1c Hypertension well controlled  Recheck 4 months

## 2012-09-23 ENCOUNTER — Encounter: Payer: Self-pay | Admitting: Gastroenterology

## 2012-10-15 ENCOUNTER — Encounter: Payer: Self-pay | Admitting: Gastroenterology

## 2012-12-16 ENCOUNTER — Ambulatory Visit (AMBULATORY_SURGERY_CENTER): Payer: Self-pay | Admitting: *Deleted

## 2012-12-16 VITALS — Ht 72.0 in | Wt 179.6 lb

## 2012-12-16 DIAGNOSIS — Z86010 Personal history of colonic polyps: Secondary | ICD-10-CM

## 2012-12-16 MED ORDER — MOVIPREP 100 G PO SOLR
ORAL | Status: DC
Start: 2012-12-16 — End: 2013-01-05

## 2012-12-16 NOTE — Progress Notes (Signed)
No allergies to eggs or soy. No surgery or anesthesia

## 2012-12-17 ENCOUNTER — Encounter: Payer: Self-pay | Admitting: Gastroenterology

## 2013-01-04 ENCOUNTER — Ambulatory Visit (INDEPENDENT_AMBULATORY_CARE_PROVIDER_SITE_OTHER): Payer: 59 | Admitting: Internal Medicine

## 2013-01-04 ENCOUNTER — Encounter: Payer: Self-pay | Admitting: Internal Medicine

## 2013-01-04 VITALS — BP 118/70 | HR 85 | Temp 98.1°F | Resp 20 | Wt 180.0 lb

## 2013-01-04 DIAGNOSIS — Z23 Encounter for immunization: Secondary | ICD-10-CM

## 2013-01-04 DIAGNOSIS — I10 Essential (primary) hypertension: Secondary | ICD-10-CM

## 2013-01-04 DIAGNOSIS — E119 Type 2 diabetes mellitus without complications: Secondary | ICD-10-CM

## 2013-01-04 LAB — HEMOGLOBIN A1C: Hgb A1c MFr Bld: 6.6 % — ABNORMAL HIGH (ref 4.6–6.5)

## 2013-01-04 MED ORDER — CANAGLIFLOZIN 300 MG PO TABS: 1.0000 | ORAL_TABLET | Freq: Every day | ORAL | Status: DC

## 2013-01-04 NOTE — Patient Instructions (Signed)
Please check your hemoglobin A1c every 3 months    It is important that you exercise regularly, at least 20 minutes 3 to 4 times per week.  If you develop chest pain or shortness of breath seek  medical attention.   

## 2013-01-04 NOTE — Progress Notes (Signed)
Subjective:    Patient ID: Justin [name] , male    DOB: 1956/01/02, 57 y.o.   MRN: [id number]  HPI  57 year old patient who is seen today for followup of type 2 diabetes.  Last hemoglobin A1c 7.1. He continues to do quite well. Did have an eye exam last month. No new concerns or complaints. He does have some cost issues with [facility] and wishes to consider other alternatives.  Wt Readings from Last 3 Encounters:  01/04/13 180 lb (81.647 kg)  12/16/12 179 lb 9.6 oz (81.466 kg)  09/01/12 184 lb (83.462 kg)    Past Medical History  Diagnosis Date  . Allergy   . Hypertension   . Diabetes mellitus   . ED (erectile dysfunction)   . History of colonic polyps     History   Social History  . Marital Status: Married    Spouse Name: N/A    Number of Children: N/A  . Years of Education: N/A   Occupational History  . Not on file.   Social History Main Topics  . Smoking status: Never Smoker   . Smokeless tobacco: Never Used  . Alcohol Use: No  . Drug Use: No  . Sexual Activity: Not on file   Other Topics Concern  . Not on file   Social History Narrative  . No narrative on file    Past Surgical History  Procedure Laterality Date  . No prior surgery      Family History  Problem Relation Age of Onset  . Hypertension Mother   . Stroke Mother   . Dementia Father   . Hypertension Sister   . Diabetes Brother   . Hypertension Brother   . Cerebral palsy Daughter   . Cerebral palsy Daughter     No Known Allergies  Current Outpatient Prescriptions on File Prior to Visit  Medication Sig Dispense Refill  . amLODipine (NORVASC) 5 MG tablet TAKE 1 TABLET DAILY  90 tablet  3  . benazepril-hydrochlorthiazide (LOTENSIN HCT) 20-12.5 MG per tablet TAKE 1 TABLET DAILY  90 tablet  3  . Exenatide ER (BYDUREON) 2 MG SUSR Inject 2 mg into the skin once a week.  3 each  6  . glucose blood (ONETOUCH VERIO) test strip Use as instructed  100 each  12  . metFORMIN (GLUCOPHAGE) 1000 MG  tablet TAKE 1 TABLET TWICE A DAY  180 tablet  3  . MOVIPREP 100 G SOLR moviprep as directed. No substitutions  1 kit  0  . ONETOUCH DELICA LANCETS FINE MISC 1 each by Does not apply route 2 (two) times daily.  100 each  3  . sildenafil (VIAGRA) 100 MG tablet Take 1 tablet (100 mg total) by mouth daily as needed.  10 tablet  6   No current facility-administered medications on file prior to visit.    BP 118/70  Pulse 85  Temp(Src) 98.1 F (36.7 C) (Oral)  Resp 20  Wt 180 lb (81.647 kg)  BMI 24.41 kg/m2  SpO2 99%      Review of Systems  Constitutional: Negative for fever, chills, appetite change and fatigue.  HENT: Negative for hearing loss, ear pain, congestion, sore throat, trouble swallowing, neck stiffness, dental problem, voice change and tinnitus.   Eyes: Negative for pain, discharge and visual disturbance.  Respiratory: Negative for cough, chest tightness, wheezing and stridor.   Cardiovascular: Negative for chest pain, palpitations and leg swelling.  Gastrointestinal: Negative for nausea, vomiting, abdominal pain, diarrhea, constipation, blood in  stool and abdominal distention.  Genitourinary: Negative for urgency, hematuria, flank pain, discharge, difficulty urinating and genital sores.  Musculoskeletal: Negative for myalgias, back pain, joint swelling, arthralgias and gait problem.  Skin: Negative for rash.  Neurological: Negative for dizziness, syncope, speech difficulty, weakness, numbness and headaches.  Hematological: Negative for adenopathy. Does not bruise/bleed easily.  Psychiatric/Behavioral: Negative for behavioral problems and dysphoric mood. The patient is not nervous/anxious.        Objective:   Physical Exam  Constitutional: He is oriented to person, place, and time. He appears well-developed.  HENT:  Head: Normocephalic.  Right Ear: External ear normal.  Left Ear: External ear normal.  Eyes: Conjunctivae and EOM are normal.  Neck: Normal range of  motion.  Cardiovascular: Normal rate and normal heart sounds.   Pulmonary/Chest: Breath sounds normal.  Abdominal: Bowel sounds are normal.  Musculoskeletal: Normal range of motion. He exhibits no edema and no tenderness.  Neurological: He is alert and oriented to person, place, and time.  Psychiatric: He has a normal mood and affect. His behavior is normal.          Assessment & Plan:

## 2013-01-05 ENCOUNTER — Ambulatory Visit (AMBULATORY_SURGERY_CENTER): Payer: 59 | Admitting: Gastroenterology

## 2013-01-05 ENCOUNTER — Encounter: Payer: Self-pay | Admitting: Gastroenterology

## 2013-01-05 VITALS — BP 115/68 | HR 85 | Temp 97.0°F | Resp 16 | Ht 72.0 in | Wt 179.0 lb

## 2013-01-05 DIAGNOSIS — Z86010 Personal history of colonic polyps: Secondary | ICD-10-CM

## 2013-01-05 MED ORDER — SODIUM CHLORIDE 0.9 % IV SOLN: 500.0000 mL | INTRAVENOUS | Status: DC

## 2013-01-05 NOTE — Op Note (Signed)
[facility] [address] [place], [address]   COLONOSCOPY PROCEDURE REPORT  PATIENT: Justin [name], Justin [name]  MR#: [id number] BIRTHDATE: October 21, 1955 , 57  yrs. old GENDER: Male ENDOSCOPIST: Rachael Fee, MD PROCEDURE DATE:  01/05/2013 PROCEDURE:   Colonoscopy, surveillance First Screening Colonoscopy - Avg.  risk and is 50 yrs.  old or older - No.  Prior Negative Screening - Now for repeat screening. N/A  History of Adenoma - Now for follow-up colonoscopy & has been > or = to 3 yrs.  Yes hx of adenoma.  Has been 3 or more years since last colonoscopy.  Polyps Removed Today? No.  Recommend repeat exam, <10 yrs? No. ASA CLASS:   Class II INDICATIONS:single small TA removed in 2009. MEDICATIONS: Fentanyl 50 mcg IV, Versed 5 mg IV, and These medications were titrated to patient response per physician's verbal order  DESCRIPTION OF PROCEDURE:   After the risks benefits and alternatives of the procedure were thoroughly explained, informed consent was obtained.  A digital rectal exam revealed no abnormalities of the rectum.   The LB CF-[id number] Loaner [id number] endoscope was introduced through the anus and advanced to the cecum, which was identified by both the appendix and ileocecal valve. No adverse events experienced.   The quality of the prep was good.  The instrument was then slowly withdrawn as the colon was fully examined.   COLON FINDINGS: A normal appearing cecum, ileocecal valve, and appendiceal orifice were identified.  The ascending, hepatic flexure, transverse, splenic flexure, descending, sigmoid colon and rectum appeared unremarkable.  No polyps or cancers were seen. Retroflexed views revealed no abnormalities. The time to cecum=1 minutes 30 seconds.  Withdrawal time=7 minutes 04 seconds.  The scope was withdrawn and the procedure completed. COMPLICATIONS: There were no complications.  ENDOSCOPIC IMPRESSION: Normal colon No polyps or  cancers  RECOMMENDATIONS: You should continue to follow colorectal cancer screening guidelines for "routine risk" patients with a repeat colonoscopy in 10 years.   eSigned:  Rachael Fee, MD 01/05/2013 10:12 AM   cc: Eleonore Chiquito, MD

## 2013-01-05 NOTE — Patient Instructions (Signed)
YOU HAD AN ENDOSCOPIC PROCEDURE TODAY AT THE [facility]: Refer to the procedure report that was given to you for any specific questions about what was found during the examination.  If the procedure report does not answer your questions, please call your gastroenterologist to clarify.  If you requested that your care partner not be given the details of your procedure findings, then the procedure report has been included in a sealed envelope for you to review at your convenience later.  YOU SHOULD EXPECT: Some feelings of bloating in the abdomen. Passage of more gas than usual.  Walking can help get rid of the air that was put into your GI tract during the procedure and reduce the bloating. If you had a lower endoscopy (such as a colonoscopy or flexible sigmoidoscopy) you may notice spotting of blood in your stool or on the toilet paper. If you underwent a bowel prep for your procedure, then you may not have a normal bowel movement for a few days.  DIET: Your first meal following the procedure should be a light meal and then it is ok to progress to your normal diet.  A half-sandwich or bowl of soup is an example of a good first meal.  Heavy or fried foods are harder to digest and may make you feel nauseous or bloated.  Likewise meals heavy in dairy and vegetables can cause extra gas to form and this can also increase the bloating.  Drink plenty of fluids but you should avoid alcoholic beverages for 24 hours.  ACTIVITY: Your care partner should take you home directly after the procedure.  You should plan to take it easy, moving slowly for the rest of the day.  You can resume normal activity the day after the procedure however you should NOT DRIVE or use heavy machinery for 24 hours (because of the sedation medicines used during the test).    SYMPTOMS TO REPORT IMMEDIATELY: A gastroenterologist can be reached at any hour.  During normal business hours, 8:30 AM to 5:00 PM Monday through Friday,  call [phone].  After hours and on weekends, please call the GI answering service at [phone] who will take a message and have the physician on call contact you.   Following lower endoscopy (colonoscopy or flexible sigmoidoscopy):  Excessive amounts of blood in the stool  Significant tenderness or worsening of abdominal pains  Swelling of the abdomen that is new, acute  Fever of 100F or higher    FOLLOW UP: If any biopsies were taken you will be contacted by phone or by letter within the next 1-3 weeks.  Call your gastroenterologist if you have not heard about the biopsies in 3 weeks.  Our staff will call the home number listed on your records the next business day following your procedure to check on you and address any questions or concerns that you may have at that time regarding the information given to you following your procedure. This is a courtesy call and so if there is no answer at the home number and we have not heard from you through the emergency physician on call, we will assume that you have returned to your regular daily activities without incident.  SIGNATURES/CONFIDENTIALITY: You and/or your care partner have signed paperwork which will be entered into your electronic medical record.  These signatures attest to the fact that that the information above on your After Visit Summary has been reviewed and is understood.  Full responsibility of the confidentiality   of this discharge information lies with you and/or your care-partner.     

## 2013-01-05 NOTE — Progress Notes (Signed)
Patient did not experience any of the following events: a burn prior to discharge; a fall within the facility; wrong site/side/patient/procedure/implant event; or a hospital transfer or hospital admission upon discharge from the facility. (G8907) Patient did not have preoperative order for IV antibiotic SSI prophylaxis. (G8918)  

## 2013-01-05 NOTE — Progress Notes (Signed)
No egg or soy allergy. ewm 

## 2013-01-06 ENCOUNTER — Telehealth: Payer: Self-pay | Admitting: *Deleted

## 2013-01-06 NOTE — Telephone Encounter (Signed)
Message left

## 2013-02-18 ENCOUNTER — Other Ambulatory Visit: Payer: Self-pay

## 2013-05-10 ENCOUNTER — Encounter: Payer: Self-pay | Admitting: Internal Medicine

## 2013-05-10 ENCOUNTER — Ambulatory Visit (INDEPENDENT_AMBULATORY_CARE_PROVIDER_SITE_OTHER): Payer: 59 | Admitting: Internal Medicine

## 2013-05-10 VITALS — BP 116/60 | HR 90 | Temp 98.3°F | Resp 20 | Ht 72.0 in | Wt 175.0 lb

## 2013-05-10 DIAGNOSIS — I10 Essential (primary) hypertension: Secondary | ICD-10-CM

## 2013-05-10 DIAGNOSIS — E119 Type 2 diabetes mellitus without complications: Secondary | ICD-10-CM

## 2013-05-10 LAB — HEMOGLOBIN A1C: Hgb A1c MFr Bld: 6.9 % — ABNORMAL HIGH (ref 4.6–6.5)

## 2013-05-10 MED ORDER — CANAGLIFLOZIN-METFORMIN HCL 150-1000 MG PO TABS: 1.0000 | ORAL_TABLET | Freq: Two times a day (BID) | ORAL | Status: DC

## 2013-05-10 MED ORDER — BENAZEPRIL HCL 20 MG PO TABS: 20.0000 mg | ORAL_TABLET | Freq: Every day | ORAL | Status: DC

## 2013-05-10 NOTE — Addendum Note (Signed)
Addended by: Jimmye NormanPHANOS, Kenji Mapel J on: 05/10/2013 12:04 PM   Modules accepted: Orders

## 2013-05-10 NOTE — Progress Notes (Signed)
Pre-visit discussion using our clinic review tool. No additional management support is needed unless otherwise documented below in the visit note.  

## 2013-05-10 NOTE — Progress Notes (Signed)
Subjective:    Patient ID: Justin [name], male    DOB: 19-May-1955, 58 y.o.   MRN: [id number]  HPI  58 year old patient who has type 2 diabetes. It has been well-controlled.  Since being placed on Invokanna, he has enjoyed a modest weight loss as well as improved blood pressure control. He does describe some occasional dizziness.  Past Medical History  Diagnosis Date  . Allergy   . Hypertension   . Diabetes mellitus   . ED (erectile dysfunction)   . History of colonic polyps     History   Social History  . Marital Status: Married    Spouse Name: N/A    Number of Children: N/A  . Years of Education: N/A   Occupational History  . Not on file.   Social History Main Topics  . Smoking status: Never Smoker   . Smokeless tobacco: Never Used  . Alcohol Use: Yes     Comment: socially  . Drug Use: No  . Sexual Activity: Not on file   Other Topics Concern  . Not on file   Social History Narrative  . No narrative on file    Past Surgical History  Procedure Laterality Date  . No prior surgery      Family History  Problem Relation Age of Onset  . Hypertension Mother   . Stroke Mother   . Dementia Father   . Hypertension Sister   . Diabetes Brother   . Hypertension Brother   . Cerebral palsy Daughter   . Cerebral palsy Daughter   . Colon cancer Neg Hx     No Known Allergies  Current Outpatient Prescriptions on File Prior to Visit  Medication Sig Dispense Refill  . amLODipine (NORVASC) 5 MG tablet TAKE 1 TABLET DAILY  90 tablet  3  . benazepril-hydrochlorthiazide (LOTENSIN HCT) 20-12.5 MG per tablet TAKE 1 TABLET DAILY  90 tablet  3  . Canagliflozin (INVOKANA) 300 MG TABS Take 1 tablet (300 mg total) by mouth daily.  90 tablet  6  . glucose blood (ONETOUCH VERIO) test strip Use as instructed  100 each  12  . metFORMIN (GLUCOPHAGE) 1000 MG tablet TAKE 1 TABLET TWICE A DAY  180 tablet  3  . Multiple Vitamin (MULTIVITAMIN) capsule Take 1 capsule by mouth daily.       [name] 1 each by Does not apply route 2 (two) times daily.  100 each  3  . sildenafil (VIAGRA) 100 MG tablet Take 1 tablet (100 mg total) by mouth daily as needed.  10 tablet  6   No current facility-administered medications on file prior to visit.    BP 116/60  Pulse 90  Temp(Src) 98.3 F (36.8 C) (Oral)  Resp 20  Ht 6' (1.829 m)  Wt 175 lb (79.379 kg)  BMI 23.73 kg/m2  SpO2 99%       Review of Systems  Constitutional: Positive for unexpected weight change. Negative for fever, chills, appetite change and fatigue.  HENT: Negative for congestion, dental problem, ear pain, hearing loss, sore throat, tinnitus, trouble swallowing and voice change.   Eyes: Negative for pain, discharge and visual disturbance.  Respiratory: Negative for cough, chest tightness, wheezing and stridor.   Cardiovascular: Negative for chest pain, palpitations and leg swelling.  Gastrointestinal: Negative for nausea, vomiting, abdominal pain, diarrhea, constipation, blood in stool and abdominal distention.  Genitourinary: Positive for frequency. Negative for urgency, hematuria, flank pain, discharge, difficulty urinating and  genital sores.  Musculoskeletal: Negative for arthralgias, back pain, gait problem, joint swelling, myalgias and neck stiffness.  Skin: Negative for rash.  Neurological: Positive for light-headedness. Negative for dizziness, syncope, speech difficulty, weakness, numbness and headaches.  Hematological: Negative for adenopathy. Does not bruise/bleed easily.  Psychiatric/Behavioral: Negative for behavioral problems and dysphoric mood. The patient is not nervous/anxious.        Objective:   Physical Exam  Constitutional: He is oriented to person, place, and time. He appears well-developed.  Blood pressure 120/60  HENT:  Head: Normocephalic.  Right Ear: External ear normal.  Left Ear: External ear normal.  Eyes: Conjunctivae and EOM are normal.  Neck:  Normal range of motion.  Cardiovascular: Normal rate and normal heart sounds.   Pulmonary/Chest: Breath sounds normal.  Abdominal: Bowel sounds are normal.  Musculoskeletal: Normal range of motion. He exhibits no edema and no tenderness.  Neurological: He is alert and oriented to person, place, and time.  Psychiatric: He has a normal mood and affect. His behavior is normal.          Assessment & Plan:   Diabetes mellitus. Appears to be under excellent control. We'll check a hemoglobin A1c Hypertension. Blood pressure low normal. Probably has improved since starting Invokanna-will discontinue hydrochlorothiazide

## 2013-05-10 NOTE — Patient Instructions (Signed)
Limit your sodium (Salt) intake    It is important that you exercise regularly, at least 20 minutes 3 to 4 times per week.  If you develop chest pain or shortness of breath seek  medical attention.   Please check your hemoglobin A1c every 3 months   

## 2013-05-12 ENCOUNTER — Telehealth: Payer: Self-pay

## 2013-05-12 NOTE — Telephone Encounter (Signed)
Relevant patient education assigned to patient using Emmi. ° °

## 2013-05-19 ENCOUNTER — Telehealth: Payer: Self-pay | Admitting: Internal Medicine

## 2013-05-19 NOTE — Telephone Encounter (Signed)
Relevant patient education assigned to patient using Emmi. ° °

## 2013-06-10 ENCOUNTER — Telehealth: Payer: Self-pay | Admitting: Internal Medicine

## 2013-06-10 MED ORDER — BENAZEPRIL HCL 20 MG PO TABS: 20.0000 mg | ORAL_TABLET | Freq: Every day | ORAL | Status: DC

## 2013-06-10 NOTE — Telephone Encounter (Signed)
[facility] requesting refill of benazepril (LOTENSIN) 20 MG tablet

## 2013-06-10 NOTE — Telephone Encounter (Signed)
Rx sent to [facility]

## 2013-06-11 MED ORDER — BENAZEPRIL HCL 20 MG PO TABS: 20.0000 mg | ORAL_TABLET | Freq: Every day | ORAL | Status: DC

## 2013-06-11 NOTE — Addendum Note (Signed)
Addended by: Kern ReapVEREEN, Numa Heatwole B on: 06/11/2013 10:04 AM   Modules accepted: Orders

## 2013-06-25 ENCOUNTER — Encounter: Payer: Self-pay | Admitting: Family Medicine

## 2013-06-25 ENCOUNTER — Other Ambulatory Visit: Payer: Self-pay | Admitting: Internal Medicine

## 2013-06-25 ENCOUNTER — Ambulatory Visit (INDEPENDENT_AMBULATORY_CARE_PROVIDER_SITE_OTHER): Payer: 59 | Admitting: Family Medicine

## 2013-06-25 VITALS — BP 120/80 | HR 126 | Temp 99.7°F | Wt 175.0 lb

## 2013-06-25 DIAGNOSIS — J069 Acute upper respiratory infection, unspecified: Secondary | ICD-10-CM

## 2013-06-25 MED ORDER — ALBUTEROL SULFATE HFA 108 (90 BASE) MCG/ACT IN AERS: 2.0000 | INHALATION_SPRAY | Freq: Four times a day (QID) | RESPIRATORY_TRACT | Status: DC | PRN

## 2013-06-25 MED ORDER — HYDROCOD POLST-CHLORPHEN POLST 10-8 MG/5ML PO LQCR: 5.0000 mL | Freq: Two times a day (BID) | ORAL | Status: DC | PRN

## 2013-06-25 NOTE — Patient Instructions (Signed)
INSTRUCTIONS FOR UPPER RESPIRATORY INFECTION:  -plenty of rest and fluids  -nasal saline wash 2-3 times daily (use prepackaged nasal saline or bottled/distilled water if making your own)   -can use sinex OR afrin nasal spray for drainage and nasal congestion - but do NOT use longer then 3-4 days  -can use tylenol or ibuprofen as directed for aches and sorethroat  -in the winter time, using a humidifier at night is helpful (please follow cleaning instructions)  -if you are taking a cough medication - use only as directed, may also try a teaspoon of honey to coat the throat and throat lozenges  -for sore throat, salt water gargles can help  -follow up if you have fevers, facial pain, tooth pain, difficulty breathing or are worsening or not getting better in 5-7 days

## 2013-06-25 NOTE — Progress Notes (Signed)
Pre visit review using our clinic review tool, if applicable. No additional management support is needed unless otherwise documented below in the visit note. 

## 2013-06-25 NOTE — Progress Notes (Signed)
Chief Complaint  Patient presents with  . Sore Throat    cough, chills     HPI:  -started: 4 days ago -symptoms:nasal congestion, sore throat, cough, chills, occ wheezing -denies:fever, SOB, NVD, tooth pain -has tried: musinex -sick contacts/travel/risks: denies flu exposure or Ebola risks -Hx of: allergies  ROS: See pertinent positives and negatives per HPI.  Past Medical History  Diagnosis Date  . Allergy   . Hypertension   . Diabetes mellitus   . ED (erectile dysfunction)   . History of colonic polyps     Past Surgical History  Procedure Laterality Date  . No prior surgery      Family History  Problem Relation Age of Onset  . Hypertension Mother   . Stroke Mother   . Dementia Father   . Hypertension Sister   . Diabetes Brother   . Hypertension Brother   . Cerebral palsy Daughter   . Cerebral palsy Daughter   . Colon cancer Neg Hx     History   Social History  . Marital Status: Married    Spouse Name: N/A    Number of Children: N/A  . Years of Education: N/A   Social History Main Topics  . Smoking status: Never Smoker   . Smokeless tobacco: Never Used  . Alcohol Use: Yes     Comment: socially  . Drug Use: No  . Sexual Activity: None   Other Topics Concern  . None   Social History Narrative  . None    Current outpatient prescriptions:amLODipine (NORVASC) 5 MG tablet, TAKE 1 TABLET DAILY, Disp: 90 tablet, Rfl: 3;  benazepril (LOTENSIN) 20 MG tablet, Take 1 tablet (20 mg total) by mouth daily., Disp: 90 tablet, Rfl: 3;  Canagliflozin (INVOKANA) 300 MG TABS, Take 1 tablet (300 mg total) by mouth daily., Disp: 90 tablet, Rfl: 6 Canagliflozin-Metformin HCl [phone] MG TABS, Take 1 tablet by mouth 2 (two) times daily before a meal., Disp: 180 tablet, Rfl: 6;  glucose blood (ONETOUCH VERIO) test strip, Use as instructed, Disp: 100 each, Rfl: 12;  metFORMIN (GLUCOPHAGE) 1000 MG tablet, TAKE 1 TABLET TWICE A DAY, Disp: 180 tablet, Rfl: 3;  Multiple Vitamin  (MULTIVITAMIN) capsule, Take 1 capsule by mouth daily., Disp: , Rfl:  ONETOUCH DELICA LANCETS FINE MISC, 1 each by Does not apply route 2 (two) times daily., Disp: 100 each, Rfl: 3;  sildenafil (VIAGRA) 100 MG tablet, Take 1 tablet (100 mg total) by mouth daily as needed., Disp: 10 tablet, Rfl: 6;  albuterol (PROVENTIL HFA;VENTOLIN HFA) 108 (90 BASE) MCG/ACT inhaler, Inhale 2 puffs into the lungs every 6 (six) hours as needed for wheezing or shortness of breath., Disp: 1 Inhaler, Rfl: 0 chlorpheniramine-HYDROcodone (TUSSIONEX PENNKINETIC ER) 10-8 MG/5ML LQCR, Take 5 mLs by mouth every 12 (twelve) hours as needed., Disp: 115 mL, Rfl: 0  EXAM:  Filed Vitals:   06/25/13 0856  BP: 120/80  Pulse: 126  Temp: 99.7 F (37.6 C)    Body mass index is 23.73 kg/(m^2).  GENERAL: vitals reviewed and listed above, alert, oriented, appears well hydrated and in no acute distress  HEENT: atraumatic, conjunttiva clear, no obvious abnormalities on inspection of external nose and ears, normal appearance of ear canals and TMs, clear nasal congestion, mild post oropharyngeal erythema with PND, no tonsillar edema or exudate, no sinus TTP  NECK: no obvious masses on inspection  LUNGS: clear to auscultation bilaterally, no wheezes, rales or rhonchi, good air movement  CV: HRRR, no peripheral edema  MS: moves all extremities without noticeable abnormality  PSYCH: pleasant and cooperative, no obvious depression or anxiety  ASSESSMENT AND PLAN:  Discussed the following assessment and plan:  Upper respiratory infection - Plan: albuterol (PROVENTIL HFA;VENTOLIN HFA) 108 (90 BASE) MCG/ACT inhaler, chlorpheniramine-HYDROcodone (TUSSIONEX PENNKINETIC ER) 10-8 MG/5ML LQCR  -given HPI and exam findings today, a serious infection or illness is unlikely. We discussed potential etiologies, with VURI being most likely, and advised supportive care and monitoring. We discussed treatment side effects, likely course,  antibiotic misuse, transmission, and signs of developing a serious illness. -of course, we advised to return or notify a doctor immediately if symptoms worsen or persist or new concerns arise.    Patient Instructions  INSTRUCTIONS FOR UPPER RESPIRATORY INFECTION:  -plenty of rest and fluids  -nasal saline wash 2-3 times daily (use prepackaged nasal saline or bottled/distilled water if making your own)   -can use sinex OR afrin nasal spray for drainage and nasal congestion - but do NOT use longer then 3-4 days  -can use tylenol or ibuprofen as directed for aches and sorethroat  -in the winter time, using a humidifier at night is helpful (please follow cleaning instructions)  -if you are taking a cough medication - use only as directed, may also try a teaspoon of honey to coat the throat and throat lozenges  -for sore throat, salt water gargles can help  -follow up if you have fevers, facial pain, tooth pain, difficulty breathing or are worsening or not getting better in 5-7 days      KIM, HANNAH R.

## 2013-08-09 ENCOUNTER — Other Ambulatory Visit (INDEPENDENT_AMBULATORY_CARE_PROVIDER_SITE_OTHER): Payer: 59

## 2013-08-09 DIAGNOSIS — E1110 Type 2 diabetes mellitus with ketoacidosis without coma: Secondary | ICD-10-CM

## 2013-08-09 LAB — HEMOGLOBIN A1C: Hgb A1c MFr Bld: 6.6 % — ABNORMAL HIGH (ref 4.6–6.5)

## 2013-08-16 ENCOUNTER — Encounter: Payer: Self-pay | Admitting: Internal Medicine

## 2013-08-16 ENCOUNTER — Ambulatory Visit (INDEPENDENT_AMBULATORY_CARE_PROVIDER_SITE_OTHER): Payer: 59 | Admitting: Internal Medicine

## 2013-08-16 VITALS — BP 120/80 | HR 86 | Temp 97.7°F | Resp 20 | Ht 72.0 in | Wt 175.0 lb

## 2013-08-16 DIAGNOSIS — I10 Essential (primary) hypertension: Secondary | ICD-10-CM

## 2013-08-16 DIAGNOSIS — E119 Type 2 diabetes mellitus without complications: Secondary | ICD-10-CM

## 2013-08-16 MED ORDER — CANAGLIFLOZIN 300 MG PO TABS: 1.0000 | ORAL_TABLET | Freq: Every day | ORAL | Status: DC

## 2013-08-16 MED ORDER — METFORMIN HCL 1000 MG PO TABS: ORAL_TABLET | ORAL | Status: DC

## 2013-08-16 MED ORDER — BENAZEPRIL HCL 20 MG PO TABS: 20.0000 mg | ORAL_TABLET | Freq: Every day | ORAL | Status: DC

## 2013-08-16 MED ORDER — AMLODIPINE BESYLATE 5 MG PO TABS: ORAL_TABLET | ORAL | Status: DC

## 2013-08-16 NOTE — Patient Instructions (Signed)
Limit your sodium (Salt) intake    It is important that you exercise regularly, at least 20 minutes 3 to 4 times per week.  If you develop chest pain or shortness of breath seek  medical attention.  Please see your eye doctor yearly to check for diabetic eye damage   Return in 6 months for follow-up  

## 2013-08-16 NOTE — Progress Notes (Signed)
Subjective:    Patient ID: Justin [name], male    DOB: [birth year], 58 y.o.   MRN: [id number]  HPI  58 year old patient who is in today for followup.  He has a history of hypertension and type 2 diabetes.  He is maintaining very nice glycemic control on his present oral medical regimen. No new concerns or complaints  Wt Readings from Last 3 Encounters:  08/16/13 175 lb (79.379 kg)  06/25/13 175 lb (79.379 kg)  05/10/13 175 lb (79.379 kg)    Lab Results  Component Value Date   HGBA1C 6.6* 08/09/2013    Past Medical History  Diagnosis Date  . Allergy   . Hypertension   . Diabetes mellitus   . ED (erectile dysfunction)   . History of colonic polyps     History   Social History  . Marital Status: Married    Spouse Name: N/A    Number of Children: N/A  . Years of Education: N/A   Occupational History  . Not on file.   Social History Main Topics  . Smoking status: Never Smoker   . Smokeless tobacco: Never Used  . Alcohol Use: Yes     Comment: socially  . Drug Use: No  . Sexual Activity: Not on file   Other Topics Concern  . Not on file   Social History Narrative  . No narrative on file    Past Surgical History  Procedure Laterality Date  . No prior surgery      Family History  Problem Relation Age of Onset  . Hypertension Mother   . Stroke Mother   . Dementia Father   . Hypertension Sister   . Diabetes Brother   . Hypertension Brother   . Cerebral palsy Daughter   . Cerebral palsy Daughter   . Colon cancer Neg Hx     No Known Allergies  Current Outpatient Prescriptions on File Prior to Visit  Medication Sig Dispense Refill  . glucose blood (ONETOUCH VERIO) test strip Use as instructed  100 each  12  . Multiple Vitamin (MULTIVITAMIN) capsule Take 1 capsule by mouth daily.      Letta Pate. ONETOUCH DELICA LANCETS FINE MISC 1 each by Does not apply route 2 (two) times daily.  100 each  3  . sildenafil (VIAGRA) 100 MG tablet Take 1 tablet (100 mg total)  by mouth daily as needed.  10 tablet  6   No current facility-administered medications on file prior to visit.    BP 120/80  Pulse 86  Temp(Src) 97.7 F (36.5 C) (Oral)  Resp 20  Ht 6' (1.829 m)  Wt 175 lb (79.379 kg)  BMI 23.73 kg/m2  SpO2 98%       Review of Systems  Constitutional: Negative for fever, chills, appetite change and fatigue.  HENT: Negative for congestion, dental problem, ear pain, hearing loss, sore throat, tinnitus, trouble swallowing and voice change.   Eyes: Negative for pain, discharge and visual disturbance.  Respiratory: Negative for cough, chest tightness, wheezing and stridor.   Cardiovascular: Negative for chest pain, palpitations and leg swelling.  Gastrointestinal: Negative for nausea, vomiting, abdominal pain, diarrhea, constipation, blood in stool and abdominal distention.  Genitourinary: Negative for urgency, hematuria, flank pain, discharge, difficulty urinating and genital sores.  Musculoskeletal: Negative for arthralgias, back pain, gait problem, joint swelling, myalgias and neck stiffness.  Skin: Negative for rash.  Neurological: Negative for dizziness, syncope, speech difficulty, weakness, numbness and headaches.  Hematological: Negative for adenopathy.  Does not bruise/bleed easily.  Psychiatric/Behavioral: Negative for behavioral problems and dysphoric mood. The patient is not nervous/anxious.        Objective:   Physical Exam  Constitutional: He is oriented to person, place, and time. He appears well-developed.  HENT:  Head: Normocephalic.  Right Ear: External ear normal.  Left Ear: External ear normal.  Eyes: Conjunctivae and EOM are normal.  Neck: Normal range of motion.  Cardiovascular: Normal rate and normal heart sounds.   Pulmonary/Chest: Breath sounds normal.  Abdominal: Bowel sounds are normal.  Musculoskeletal: Normal range of motion. He exhibits no edema and no tenderness.  Neurological: He is alert and oriented to  person, place, and time.  Psychiatric: He has a normal mood and affect. His behavior is normal.          Assessment  & Plan:    Hypertension well controlled Diabetes mellitus, well-controlled  No change in  therapy CPX 6 months

## 2013-08-16 NOTE — Progress Notes (Signed)
Pre-visit discussion using our clinic review tool. No additional management support is needed unless otherwise documented below in the visit note.  

## 2013-11-11 ENCOUNTER — Telehealth: Payer: Self-pay | Admitting: *Deleted

## 2013-11-11 DIAGNOSIS — E119 Type 2 diabetes mellitus without complications: Secondary | ICD-10-CM

## 2013-11-11 NOTE — Telephone Encounter (Signed)
Left message on machine for patient to schedule a lab appointment to check cholesterol Lipid ordered Message sent to my chart Diabetic bundle

## 2013-12-01 ENCOUNTER — Telehealth: Payer: Self-pay | Admitting: Internal Medicine

## 2013-12-01 NOTE — Telephone Encounter (Signed)
Pt needs to change his  rx Canagliflozin (INVOKANA) 300 MG TABS to invokamet . Pt states dr Kirtland Bouchardk had wanted him to do this but now pt has savings card that will get him free med. [facility]/ [place]

## 2013-12-02 MED ORDER — CANAGLIFLOZIN-METFORMIN HCL 150-1000 MG PO TABS: 1.0000 | ORAL_TABLET | Freq: Two times a day (BID) | ORAL | Status: DC

## 2013-12-02 NOTE — Telephone Encounter (Signed)
Ok  #90 one daily

## 2013-12-02 NOTE — Telephone Encounter (Signed)
Spoke to pt told him Rx for Invokamet 150 mg -1000 mg BID sent to pharmacy and can discontinue Invokana and Metformin. Pt verbalized understanding.

## 2013-12-02 NOTE — Telephone Encounter (Signed)
Please see message and advise dosage

## 2013-12-09 ENCOUNTER — Other Ambulatory Visit (INDEPENDENT_AMBULATORY_CARE_PROVIDER_SITE_OTHER): Payer: 59

## 2013-12-09 DIAGNOSIS — E119 Type 2 diabetes mellitus without complications: Secondary | ICD-10-CM

## 2013-12-09 LAB — LIPID PANEL
Cholesterol: 186 mg/dL (ref 0–200)
HDL: 53.6 mg/dL (ref >39.00)
LDL Cholesterol: 119 mg/dL — ABNORMAL HIGH (ref 0–99)
NonHDL: 132.4
Total CHOL/HDL Ratio: 3
Triglycerides: 67 mg/dL (ref 0.0–149.0)
VLDL: 13.4 mg/dL (ref 0.0–40.0)

## 2014-02-15 ENCOUNTER — Other Ambulatory Visit (INDEPENDENT_AMBULATORY_CARE_PROVIDER_SITE_OTHER): Payer: 59

## 2014-02-15 DIAGNOSIS — Z0000 Encounter for general adult medical examination without abnormal findings: Secondary | ICD-10-CM

## 2014-02-15 LAB — TSH: TSH: 1.86 u[IU]/mL (ref 0.35–4.50)

## 2014-02-15 LAB — POCT URINALYSIS DIPSTICK
Bilirubin, UA: NEGATIVE
Blood, UA: NEGATIVE
Ketones, UA: NEGATIVE
Leukocytes, UA: NEGATIVE
Nitrite, UA: NEGATIVE
Protein, UA: NEGATIVE
Spec Grav, UA: 1.015
Urobilinogen, UA: 0.2
pH, UA: 5

## 2014-02-15 LAB — CBC WITH DIFFERENTIAL/PLATELET
Basophils Absolute: 0 10*3/uL (ref 0.0–0.1)
Basophils Relative: 0.4 % (ref 0.0–3.0)
Eosinophils Absolute: 0.2 10*3/uL (ref 0.0–0.7)
Eosinophils Relative: 4 % (ref 0.0–5.0)
HCT: 47.6 % (ref 39.0–52.0)
Hemoglobin: 15.8 g/dL (ref 13.0–17.0)
Lymphocytes Relative: 17.7 % (ref 12.0–46.0)
Lymphs Abs: 1.1 10*3/uL (ref 0.7–4.0)
MCHC: 33.3 g/dL (ref 30.0–36.0)
MCV: 92.3 fl (ref 78.0–100.0)
Monocytes Absolute: 0.5 10*3/uL (ref 0.1–1.0)
Monocytes Relative: 8.9 % (ref 3.0–12.0)
Neutro Abs: 4.2 10*3/uL (ref 1.4–7.7)
Neutrophils Relative %: 69 % (ref 43.0–77.0)
Platelets: 238 10*3/uL (ref 150.0–400.0)
RBC: 5.16 Mil/uL (ref 4.22–5.81)
RDW: 13.6 % (ref 11.5–15.5)
WBC: 6.1 10*3/uL (ref 4.0–10.5)

## 2014-02-15 LAB — BASIC METABOLIC PANEL
BUN: 25 mg/dL — ABNORMAL HIGH (ref 6–23)
CO2: 26 mEq/L (ref 19–32)
Calcium: 9.4 mg/dL (ref 8.4–10.5)
Chloride: 107 mEq/L (ref 96–112)
Creatinine, Ser: 1.1 mg/dL (ref 0.4–1.5)
GFR: 76.93 mL/min (ref >60.00)
Glucose, Bld: 157 mg/dL — ABNORMAL HIGH (ref 70–99)
Potassium: 5.5 mEq/L — ABNORMAL HIGH (ref 3.5–5.1)
Sodium: 141 mEq/L (ref 135–145)

## 2014-02-15 LAB — HEPATIC FUNCTION PANEL
ALT: 16 U/L (ref 0–53)
AST: 14 U/L (ref 0–37)
Albumin: 3.7 g/dL (ref 3.5–5.2)
Alkaline Phosphatase: 45 U/L (ref 39–117)
Bilirubin, Direct: 0.1 mg/dL (ref 0.0–0.3)
Total Bilirubin: 0.9 mg/dL (ref 0.2–1.2)
Total Protein: 7 g/dL (ref 6.0–8.3)

## 2014-02-15 LAB — PSA: PSA: 0.92 ng/mL (ref 0.10–4.00)

## 2014-02-15 LAB — MICROALBUMIN / CREATININE URINE RATIO
Creatinine,U: 111.2 mg/dL
Microalb Creat Ratio: 1 mg/g (ref 0.0–30.0)
Microalb, Ur: 1.1 mg/dL (ref 0.0–1.9)

## 2014-02-15 LAB — HEMOGLOBIN A1C: Hgb A1c MFr Bld: 6.9 % — ABNORMAL HIGH (ref 4.6–6.5)

## 2014-02-15 LAB — LIPID PANEL
Cholesterol: 195 mg/dL (ref 0–200)
HDL: 51.5 mg/dL (ref >39.00)
LDL Cholesterol: 127 mg/dL — ABNORMAL HIGH (ref 0–99)
NonHDL: 143.5
Total CHOL/HDL Ratio: 4
Triglycerides: 81 mg/dL (ref 0.0–149.0)
VLDL: 16.2 mg/dL (ref 0.0–40.0)

## 2014-02-22 ENCOUNTER — Ambulatory Visit (INDEPENDENT_AMBULATORY_CARE_PROVIDER_SITE_OTHER): Payer: 59 | Admitting: Internal Medicine

## 2014-02-22 ENCOUNTER — Encounter: Payer: Self-pay | Admitting: Internal Medicine

## 2014-02-22 ENCOUNTER — Ambulatory Visit (INDEPENDENT_AMBULATORY_CARE_PROVIDER_SITE_OTHER): Payer: 59 | Admitting: *Deleted

## 2014-02-22 VITALS — BP 128/64 | HR 89 | Temp 97.9°F | Resp 20 | Ht 70.75 in | Wt 180.0 lb

## 2014-02-22 DIAGNOSIS — Z860100 Personal history of colon polyps, unspecified: Secondary | ICD-10-CM

## 2014-02-22 DIAGNOSIS — I10 Essential (primary) hypertension: Secondary | ICD-10-CM

## 2014-02-22 DIAGNOSIS — Z0000 Encounter for general adult medical examination without abnormal findings: Secondary | ICD-10-CM

## 2014-02-22 DIAGNOSIS — E119 Type 2 diabetes mellitus without complications: Secondary | ICD-10-CM

## 2014-02-22 DIAGNOSIS — Z23 Encounter for immunization: Secondary | ICD-10-CM

## 2014-02-22 MED ORDER — CANAGLIFLOZIN-METFORMIN HCL 150-1000 MG PO TABS: 1.0000 | ORAL_TABLET | Freq: Two times a day (BID) | ORAL | Status: DC

## 2014-02-22 NOTE — Patient Instructions (Addendum)
Limit your sodium (Salt) intake    It is important that you exercise regularly, at least 20 minutes 3 to 4 times per week.  If you develop chest pain or shortness of breath seek  medical attention.  Return in 6 months for follow-up Health Maintenance A healthy lifestyle and preventative care can promote health and wellness.  Maintain regular health, dental, and eye exams.  Eat a healthy diet. Foods like vegetables, fruits, whole grains, low-fat dairy products, and lean protein foods contain the nutrients you need and are low in calories. Decrease your intake of foods high in solid fats, added sugars, and salt. Get information about a proper diet from your health care provider, if necessary.  Regular physical exercise is one of the most important things you can do for your health. Most adults should get at least 150 minutes of moderate-intensity exercise (any activity that increases your heart rate and causes you to sweat) each week. In addition, most adults need muscle-strengthening exercises on 2 or more days a week.   Maintain a healthy weight. The body mass index (BMI) is a screening tool to identify possible weight problems. It provides an estimate of body fat based on height and weight. Your health care provider can find your BMI and can help you achieve or maintain a healthy weight. For males 20 years and older:  A BMI below 18.5 is considered underweight.  A BMI of 18.5 to 24.9 is normal.  A BMI of 25 to 29.9 is considered overweight.  A BMI of 30 and above is considered obese.  Maintain normal blood lipids and cholesterol by exercising and minimizing your intake of saturated fat. Eat a balanced diet with plenty of fruits and vegetables. Blood tests for lipids and cholesterol should begin at age 20 and be repeated every 5 years. If your lipid or cholesterol levels are high, you are over age 50, or you are at high risk for heart disease, you may need your cholesterol levels checked  more frequently.Ongoing high lipid and cholesterol levels should be treated with medicines if diet and exercise are not working.  If you smoke, find out from your health care provider how to quit. If you do not use tobacco, do not start.  Lung cancer screening is recommended for adults aged 55-80 years who are at high risk for developing lung cancer because of a history of smoking. A yearly low-dose CT scan of the lungs is recommended for people who have at least a 30-pack-year history of smoking and are current smokers or have quit within the past 15 years. A pack year of smoking is smoking an average of 1 pack of cigarettes a day for 1 year (for example, a 30-pack-year history of smoking could mean smoking 1 pack a day for 30 years or 2 packs a day for 15 years). Yearly screening should continue until the smoker has stopped smoking for at least 15 years. Yearly screening should be stopped for people who develop a health problem that would prevent them from having lung cancer treatment.  If you choose to drink alcohol, do not have more than 2 drinks per day. One drink is considered to be 12 oz (360 mL) of beer, 5 oz (150 mL) of wine, or 1.5 oz (45 mL) of liquor.  Avoid the use of street drugs. Do not share needles with anyone. Ask for help if you need support or instructions about stopping the use of drugs.  High blood pressure causes heart disease   and increases the risk of stroke. Blood pressure should be checked at least every 1-2 years. Ongoing high blood pressure should be treated with medicines if weight loss and exercise are not effective.  If you are 45-79 years old, ask your health care provider if you should take aspirin to prevent heart disease.  Diabetes screening involves taking a blood sample to check your fasting blood sugar level. This should be done once every 3 years after age 45 if you are at a normal weight and without risk factors for diabetes. Testing should be considered at a  younger age or be carried out more frequently if you are overweight and have at least 1 risk factor for diabetes.  Colorectal cancer can be detected and often prevented. Most routine colorectal cancer screening begins at the age of 50 and continues through age 75. However, your health care provider may recommend screening at an earlier age if you have risk factors for colon cancer. On a yearly basis, your health care provider may provide home test kits to check for hidden blood in the stool. A small camera at the end of a tube may be used to directly examine the colon (sigmoidoscopy or colonoscopy) to detect the earliest forms of colorectal cancer. Talk to your health care provider about this at age 50 when routine screening begins. A direct exam of the colon should be repeated every 5-10 years through age 75, unless early forms of precancerous polyps or small growths are found.  People who are at an increased risk for hepatitis B should be screened for this virus. You are considered at high risk for hepatitis B if:  You were born in a country where hepatitis B occurs often. Talk with your health care provider about which countries are considered high risk.  Your parents were born in a high-risk country and you have not received a shot to protect against hepatitis B (hepatitis B vaccine).  You have HIV or AIDS.  You use needles to inject street drugs.  You live with, or have sex with, someone who has hepatitis B.  You are a man who has sex with other men (MSM).  You get hemodialysis treatment.  You take certain medicines for conditions like cancer, organ transplantation, and autoimmune conditions.  Hepatitis C blood testing is recommended for all people born from 1945 through 1965 and any individual with known risk factors for hepatitis C.  Healthy men should no longer receive prostate-specific antigen (PSA) blood tests as part of routine cancer screening. Talk to your health care provider  about prostate cancer screening.  Testicular cancer screening is not recommended for adolescents or adult males who have no symptoms. Screening includes self-exam, a health care provider exam, and other screening tests. Consult with your health care provider about any symptoms you have or any concerns you have about testicular cancer.  Practice safe sex. Use condoms and avoid high-risk sexual practices to reduce the spread of sexually transmitted infections (STIs).  You should be screened for STIs, including gonorrhea and chlamydia if:  You are sexually active and are younger than 24 years.  You are older than 24 years, and your health care provider tells you that you are at risk for this type of infection.  Your sexual activity has changed since you were last screened, and you are at an increased risk for chlamydia or gonorrhea. Ask your health care provider if you are at risk.  If you are at risk of being infected with   HIV, it is recommended that you take a prescription medicine daily to prevent HIV infection. This is called pre-exposure prophylaxis (PrEP). You are considered at risk if:  You are a man who has sex with other men (MSM).  You are a heterosexual man who is sexually active with multiple partners.  You take drugs by injection.  You are sexually active with a partner who has HIV.  Talk with your health care provider about whether you are at high risk of being infected with HIV. If you choose to begin PrEP, you should first be tested for HIV. You should then be tested every 3 months for as long as you are taking PrEP.  Use sunscreen. Apply sunscreen liberally and repeatedly throughout the day. You should seek shade when your shadow is shorter than you. Protect yourself by wearing long sleeves, pants, a wide-brimmed hat, and sunglasses year round whenever you are outdoors.  Tell your health care provider of new moles or changes in moles, especially if there is a change in shape  or color. Also, tell your health care provider if a mole is larger than the size of a pencil eraser.  A one-time screening for abdominal aortic aneurysm (AAA) and surgical repair of large AAAs by ultrasound is recommended for men aged 65-75 years who are current or former smokers.  Stay current with your vaccines (immunizations). Document Released: 09/28/2007 Document Revised: 04/06/2013 Document Reviewed: 08/27/2010 [employer] Patient Information 2015 [employer]. This information is not intended to replace advice given to you by your health care provider. Make sure you discuss any questions you have with your health care provider.  

## 2014-02-22 NOTE — Progress Notes (Signed)
Pre visit review using our clinic review tool, if applicable. No additional management support is needed unless otherwise documented below in the visit note. 

## 2014-02-22 NOTE — Progress Notes (Signed)
Subjective:    Patient ID: Justin [name], male    DOB: May 25, 1955, 58 y.o.   MRN: [id number]  HPI 58 year old patient who is seen today for a preventive health examination  Past Medical History  Diagnosis Date  . Allergy   . Hypertension   . Diabetes mellitus   . ED (erectile dysfunction)   . History of colonic polyps     History   Social History  . Marital Status: Married    Spouse Name: N/A    Number of Children: N/A  . Years of Education: N/A   Occupational History  . Not on file.   Social History Main Topics  . Smoking status: Never Smoker   . Smokeless tobacco: Never Used  . Alcohol Use: Yes     Comment: socially  . Drug Use: No  . Sexual Activity: Not on file   Other Topics Concern  . Not on file   Social History Narrative    Past Surgical History  Procedure Laterality Date  . No prior surgery      Family History  Problem Relation Age of Onset  . Hypertension Mother   . Stroke Mother   . Dementia Father   . Hypertension Sister   . Diabetes Brother   . Hypertension Brother   . Cerebral palsy Daughter   . Cerebral palsy Daughter   . Colon cancer Neg Hx     No Known Allergies  Current Outpatient Prescriptions on File Prior to Visit  Medication Sig Dispense Refill  . amLODipine (NORVASC) 5 MG tablet TAKE 1 TABLET DAILY 90 tablet 3  . benazepril (LOTENSIN) 20 MG tablet Take 1 tablet (20 mg total) by mouth daily. 90 tablet 3  . glucose blood (ONETOUCH VERIO) test strip Use as instructed 100 each 12  . Multiple Vitamin (MULTIVITAMIN) capsule Take 1 capsule by mouth daily.    Letta Pate. ONETOUCH DELICA LANCETS FINE MISC 1 each by Does not apply route 2 (two) times daily. 100 each 3  . sildenafil (VIAGRA) 100 MG tablet Take 1 tablet (100 mg total) by mouth daily as needed. 10 tablet 6   No current facility-administered medications on file prior to visit.    BP 128/64 mmHg  Pulse 89  Temp(Src) 97.9 F (36.6 C) (Oral)  Resp 20  Ht 5' 10.75" (1.797  m)  Wt 180 lb (81.647 kg)  BMI 25.28 kg/m2  SpO2 99%   .  Preventive Screening-Counseling & Management  Alcohol-Tobacco  Smoking Status: never  Allergies (verified): No Known Drug Allergies  Past History:  Past Medical History: Reviewed history from 08/28/2009 and no changes required. Allergic rhinitis Hypertension Diabetes mellitus, type II Colonic polyps, hx of ED  Past Surgical History: Reviewed history from 01/18/2008 and no changes required. Denies surgical history colonoscopy 2009  Family History: Reviewed history from 02/27/2009 and no changes required. Family History Hypertension Family History of Stroke F 1st degree relative <60 Family History Diabetes 1st degree relative father status post CABG at age 58; dementia -died age 58 mother status post stroke. History hypertension  One brother history type 2 diabetes and hypertension one sister is well- HTN  Social History: Reviewed history from 08/24/2007 and no changes required. Alcohol use-no Drug use-no Married two twin daughters with CP; wheelchair boundSmoking Status: never Review of Systems  Constitutional: Negative for fever, chills, activity change, appetite change and fatigue.  HENT: Negative for congestion, dental problem, ear pain, hearing loss, mouth sores, rhinorrhea, sinus pressure, sneezing, tinnitus, trouble  swallowing and voice change.   Eyes: Negative for photophobia, pain, redness and visual disturbance.  Respiratory: Negative for apnea, cough, choking, chest tightness, shortness of breath and wheezing.   Cardiovascular: Negative for chest pain, palpitations and leg swelling.  Gastrointestinal: Negative for nausea, vomiting, abdominal pain, diarrhea, constipation, blood in stool, abdominal distention, anal bleeding and rectal pain.  Genitourinary: Negative for dysuria, urgency, frequency, hematuria, flank pain, decreased urine volume, discharge, penile swelling, scrotal  swelling, difficulty urinating, genital sores and testicular pain.  Musculoskeletal: Negative for myalgias, back pain, joint swelling, arthralgias, gait problem, neck pain and neck stiffness.  Skin: Negative for color change, rash and wound.  Neurological: Negative for dizziness, tremors, seizures, syncope, facial asymmetry, speech difficulty, weakness, light-headedness, numbness and headaches.  Hematological: Negative for adenopathy. Does not bruise/bleed easily.  Psychiatric/Behavioral: Negative for suicidal ideas, hallucinations, behavioral problems, confusion, sleep disturbance, self-injury, dysphoric mood, decreased concentration and agitation. The patient is not nervous/anxious.        Objective:   Physical Exam  Constitutional: He appears well-developed and well-nourished.  HENT:  Head: Normocephalic and atraumatic.  Right Ear: External ear normal.  Left Ear: External ear normal.  Nose: Nose normal.  Mouth/Throat: Oropharynx is clear and moist.  Eyes: Conjunctivae and EOM are normal. Pupils are equal, round, and reactive to light. No scleral icterus.  Neck: Normal range of motion. Neck supple. No JVD present. No thyromegaly present.  Cardiovascular: Regular rhythm, normal heart sounds and intact distal pulses.  Exam reveals no gallop and no friction rub.   No murmur heard. Pulmonary/Chest: Effort normal and breath sounds normal. He exhibits no tenderness.  Abdominal: Soft. Bowel sounds are normal. He exhibits no distension and no mass. There is no tenderness.  Genitourinary: Prostate normal and penis normal.  Musculoskeletal: Normal range of motion. He exhibits no edema or tenderness.  Lymphadenopathy:    He has no cervical adenopathy.  Neurological: He is alert. He has normal reflexes. No cranial nerve deficit. Coordination normal.  Skin: Skin is warm and dry. No rash noted.  Psychiatric: He has a normal mood and affect. His behavior is normal.          Assessment & Plan:    Preventive health examination Obvious mellitus type II, well controlled.  Will discuss statin therapy Hypertension, stable History of colonic polyps.  Follow-up colonoscopy September 2014.  10 year interval recommended  Recheck 6 months

## 2014-08-18 ENCOUNTER — Other Ambulatory Visit: Payer: Self-pay | Admitting: Internal Medicine

## 2014-08-23 ENCOUNTER — Other Ambulatory Visit: Payer: Self-pay | Admitting: *Deleted

## 2014-08-23 ENCOUNTER — Ambulatory Visit (INDEPENDENT_AMBULATORY_CARE_PROVIDER_SITE_OTHER): Payer: 59 | Admitting: Internal Medicine

## 2014-08-23 ENCOUNTER — Encounter: Payer: Self-pay | Admitting: Internal Medicine

## 2014-08-23 VITALS — BP 124/70 | HR 94 | Temp 98.0°F | Resp 20 | Ht 70.75 in | Wt 182.0 lb

## 2014-08-23 DIAGNOSIS — L84 Corns and callosities: Secondary | ICD-10-CM | POA: Diagnosis not present

## 2014-08-23 DIAGNOSIS — E119 Type 2 diabetes mellitus without complications: Secondary | ICD-10-CM

## 2014-08-23 DIAGNOSIS — I10 Essential (primary) hypertension: Secondary | ICD-10-CM | POA: Diagnosis not present

## 2014-08-23 LAB — HEMOGLOBIN A1C: Hgb A1c MFr Bld: 6.8 % — ABNORMAL HIGH (ref 4.6–6.5)

## 2014-08-23 MED ORDER — AMLODIPINE BESYLATE 5 MG PO TABS: ORAL_TABLET | ORAL | Status: DC

## 2014-08-23 MED ORDER — CANAGLIFLOZIN-METFORMIN HCL 150-1000 MG PO TABS: 1.0000 | ORAL_TABLET | Freq: Two times a day (BID) | ORAL | Status: DC

## 2014-08-23 NOTE — Patient Instructions (Signed)
Limit your sodium (Salt) intake   Please check your hemoglobin A1c every 6  months 

## 2014-08-23 NOTE — Progress Notes (Signed)
Pre visit review using our clinic review tool, if applicable. No additional management support is needed unless otherwise documented below in the visit note. 

## 2014-08-23 NOTE — Progress Notes (Signed)
Subjective:    Patient ID: Justin [name], male    DOB: May 03, 1955, 59 y.o.   MRN: [id number]  HPI  59 year old patient who has type 2 diabetes and essential hypertension.  He has done well.  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  This has not responded to over-the-counter topical therapy  No cardiopulmonary complaints Risk/benefit statin therapy again discussed  Past Medical History  Diagnosis Date  . Allergy   . Hypertension   . Diabetes mellitus   . ED (erectile dysfunction)   . History of colonic polyps     History   Social History  . Marital Status: Married    Spouse Name: N/A  . Number of Children: N/A  . Years of Education: N/A   Occupational History  . Not on file.   Social History Main Topics  . Smoking status: Never Smoker   . Smokeless tobacco: Never Used  . Alcohol Use: Yes     Comment: socially  . Drug Use: No  . Sexual Activity: Not on file   Other Topics Concern  . Not on file   Social History Narrative    Past Surgical History  Procedure Laterality Date  . No prior surgery      Family History  Problem Relation Age of Onset  . Hypertension Mother   . Stroke Mother   . Dementia Father   . Hypertension Sister   . Diabetes Brother   . Hypertension Brother   . Cerebral palsy Daughter   . Cerebral palsy Daughter   . Colon cancer Neg Hx     No Known Allergies  Current Outpatient Prescriptions on File Prior to Visit  Medication Sig Dispense Refill  . amLODipine (NORVASC) 5 MG tablet TAKE 1 TABLET DAILY 90 tablet 3  . benazepril (LOTENSIN) 20 MG tablet TAKE 1 TABLET DAILY 90 tablet 1  . Canagliflozin-Metformin HCl 605 330 6005 MG TABS Take 1 tablet by mouth 2 (two) times daily. 180 tablet 1  . glucose blood (ONETOUCH VERIO) test strip Use as instructed 100 each 12  . Multiple Vitamin (MULTIVITAMIN) capsule Take 1 capsule by mouth daily.    Letta Pate. ONETOUCH  DELICA LANCETS FINE MISC 1 each by Does not apply route 2 (two) times daily. 100 each 3  . sildenafil (VIAGRA) 100 MG tablet Take 1 tablet (100 mg total) by mouth daily as needed. 10 tablet 6   No current facility-administered medications on file prior to visit.    BP 124/70 mmHg  Pulse 94  Temp(Src) 98 F (36.7 C) (Oral)  Resp 20  Ht 5' 10.75" (1.797 m)  Wt 182 lb (82.555 kg)  BMI 25.57 kg/m2  SpO2 97%     Review of Systems  Constitutional: Negative for fever, chills, appetite change and fatigue.  HENT: Negative for congestion, dental problem, ear pain, hearing loss, sore throat, tinnitus, trouble swallowing and voice change.   Eyes: Negative for pain, discharge and visual disturbance.  Respiratory: Negative for cough, chest tightness, wheezing and stridor.   Cardiovascular: Negative for chest pain, palpitations and leg swelling.  Gastrointestinal: Negative for nausea, vomiting, abdominal pain, diarrhea, constipation, blood in stool and abdominal distention.  Genitourinary: Negative for urgency, hematuria, flank pain, discharge, difficulty urinating and genital sores.  Musculoskeletal: Negative for myalgias, back pain, joint swelling, arthralgias, gait problem and neck stiffness.  Skin: Positive for wound. Negative for rash.  Neurological: Negative for dizziness,  syncope, speech difficulty, weakness, numbness and headaches.  Hematological: Negative for adenopathy. Does not bruise/bleed easily.  Psychiatric/Behavioral: Negative for behavioral problems and dysphoric mood. The patient is not nervous/anxious.        Objective:   Physical Exam  Constitutional: He is oriented to person, place, and time. He appears well-developed.  HENT:  Head: Normocephalic.  Right Ear: External ear normal.  Left Ear: External ear normal.  Eyes: Conjunctivae and EOM are normal.  Neck: Normal range of motion.  Cardiovascular: Normal rate and normal heart sounds.   Pulmonary/Chest: Breath sounds  normal.  Abdominal: Bowel sounds are normal.  Musculoskeletal: Normal range of motion. He exhibits no edema or tenderness.  Neurological: He is alert and oriented to person, place, and time.  Skin:  Small 3-4 mm corn medial aspect of the right fifth toe  Psychiatric: He has a normal mood and affect. His behavior is normal.          Assessment & Plan:  Diabetes, stable.  We'll check a hemoglobin A1c.  No change in therapy Hypertension, well-controlled.  Repeat blood pressure 124/70 Refractory corn right fifth toe.  Treated with cryotherapy

## 2014-10-28 ENCOUNTER — Telehealth: Payer: Self-pay | Admitting: Internal Medicine

## 2014-10-28 NOTE — Telephone Encounter (Signed)
[facility] - Client TELEPHONE ADVICE RECORD   [facility]         Appt made for 7/16 at 10 am at the [facility]

## 2014-10-29 ENCOUNTER — Ambulatory Visit: Payer: Self-pay | Admitting: Family Medicine

## 2014-12-06 LAB — HM DIABETES EYE EXAM

## 2014-12-14 ENCOUNTER — Encounter: Payer: Self-pay | Admitting: Internal Medicine

## 2015-02-13 ENCOUNTER — Other Ambulatory Visit: Payer: Self-pay | Admitting: Internal Medicine

## 2015-02-21 ENCOUNTER — Other Ambulatory Visit (INDEPENDENT_AMBULATORY_CARE_PROVIDER_SITE_OTHER): Payer: 59

## 2015-02-21 DIAGNOSIS — Z0000 Encounter for general adult medical examination without abnormal findings: Secondary | ICD-10-CM

## 2015-02-21 DIAGNOSIS — Z23 Encounter for immunization: Secondary | ICD-10-CM | POA: Diagnosis not present

## 2015-02-21 LAB — BASIC METABOLIC PANEL
BUN: 24 mg/dL — ABNORMAL HIGH (ref 6–23)
CO2: 24 mEq/L (ref 19–32)
Calcium: 9.5 mg/dL (ref 8.4–10.5)
Chloride: 106 mEq/L (ref 96–112)
Creatinine, Ser: 0.91 mg/dL (ref 0.40–1.50)
GFR: 90.43 mL/min (ref >60.00)
Glucose, Bld: 165 mg/dL — ABNORMAL HIGH (ref 70–99)
Potassium: 4.6 mEq/L (ref 3.5–5.1)
Sodium: 140 mEq/L (ref 135–145)

## 2015-02-21 LAB — POCT URINALYSIS DIPSTICK
Bilirubin, UA: NEGATIVE
Blood, UA: NEGATIVE
Leukocytes, UA: NEGATIVE
Nitrite, UA: NEGATIVE
Protein, UA: NEGATIVE
Spec Grav, UA: 1.02
Urobilinogen, UA: 0.2
pH, UA: 5

## 2015-02-21 LAB — HEPATIC FUNCTION PANEL
ALT: 16 U/L (ref 0–53)
AST: 13 U/L (ref 0–37)
Albumin: 4.4 g/dL (ref 3.5–5.2)
Alkaline Phosphatase: 46 U/L (ref 39–117)
Bilirubin, Direct: 0.1 mg/dL (ref 0.0–0.3)
Total Bilirubin: 0.5 mg/dL (ref 0.2–1.2)
Total Protein: 7 g/dL (ref 6.0–8.3)

## 2015-02-21 LAB — CBC WITH DIFFERENTIAL/PLATELET
Basophils Absolute: 0 10*3/uL (ref 0.0–0.1)
Basophils Relative: 0.5 % (ref 0.0–3.0)
Eosinophils Absolute: 0.3 10*3/uL (ref 0.0–0.7)
Eosinophils Relative: 4.7 % (ref 0.0–5.0)
HCT: 47.4 % (ref 39.0–52.0)
Hemoglobin: 16 g/dL (ref 13.0–17.0)
Lymphocytes Relative: 19.7 % (ref 12.0–46.0)
Lymphs Abs: 1.2 10*3/uL (ref 0.7–4.0)
MCHC: 33.8 g/dL (ref 30.0–36.0)
MCV: 91.2 fl (ref 78.0–100.0)
Monocytes Absolute: 0.6 10*3/uL (ref 0.1–1.0)
Monocytes Relative: 9.2 % (ref 3.0–12.0)
Neutro Abs: 4.2 10*3/uL (ref 1.4–7.7)
Neutrophils Relative %: 65.9 % (ref 43.0–77.0)
Platelets: 265 10*3/uL (ref 150.0–400.0)
RBC: 5.2 Mil/uL (ref 4.22–5.81)
RDW: 13.6 % (ref 11.5–15.5)
WBC: 6.3 10*3/uL (ref 4.0–10.5)

## 2015-02-21 LAB — MICROALBUMIN / CREATININE URINE RATIO
Creatinine,U: 102.7 mg/dL
Microalb Creat Ratio: 1.4 mg/g (ref 0.0–30.0)
Microalb, Ur: 1.4 mg/dL (ref 0.0–1.9)

## 2015-02-21 LAB — LIPID PANEL
Cholesterol: 195 mg/dL (ref 0–200)
HDL: 51.3 mg/dL (ref >39.00)
LDL Cholesterol: 130 mg/dL — ABNORMAL HIGH (ref 0–99)
NonHDL: 143.28
Total CHOL/HDL Ratio: 4
Triglycerides: 64 mg/dL (ref 0.0–149.0)
VLDL: 12.8 mg/dL (ref 0.0–40.0)

## 2015-02-21 LAB — HEMOGLOBIN A1C: Hgb A1c MFr Bld: 7.2 % — ABNORMAL HIGH (ref 4.6–6.5)

## 2015-02-21 LAB — PSA: PSA: 0.32 ng/mL (ref 0.10–4.00)

## 2015-02-21 LAB — TSH: TSH: 1.58 u[IU]/mL (ref 0.35–4.50)

## 2015-02-24 ENCOUNTER — Other Ambulatory Visit: Payer: Self-pay | Admitting: Internal Medicine

## 2015-02-28 ENCOUNTER — Encounter: Payer: 59 | Admitting: Internal Medicine

## 2015-03-17 ENCOUNTER — Ambulatory Visit (INDEPENDENT_AMBULATORY_CARE_PROVIDER_SITE_OTHER): Payer: 59 | Admitting: Internal Medicine

## 2015-03-17 ENCOUNTER — Encounter: Payer: Self-pay | Admitting: Internal Medicine

## 2015-03-17 VITALS — BP 120/64 | HR 110 | Temp 97.3°F | Resp 20 | Ht 70.5 in | Wt 183.0 lb

## 2015-03-17 DIAGNOSIS — E119 Type 2 diabetes mellitus without complications: Secondary | ICD-10-CM

## 2015-03-17 DIAGNOSIS — Z0000 Encounter for general adult medical examination without abnormal findings: Secondary | ICD-10-CM | POA: Diagnosis not present

## 2015-03-17 DIAGNOSIS — Z86010 Personal history of colonic polyps: Secondary | ICD-10-CM

## 2015-03-17 DIAGNOSIS — I10 Essential (primary) hypertension: Secondary | ICD-10-CM

## 2015-03-17 MED ORDER — DULAGLUTIDE 0.75 MG/0.5ML ~~LOC~~ SOAJ: 0.7500 mg | SUBCUTANEOUS | Status: DC

## 2015-03-17 NOTE — Progress Notes (Signed)
Pre visit review using our clinic review tool, if applicable. No additional management support is needed unless otherwise documented below in the visit note. 

## 2015-03-17 NOTE — Progress Notes (Signed)
Subjective:    Patient ID: Justin [name], male    DOB: 01/08/1956, 59 y.o.   MRN: [id number]  HPI  59 year-old patient who is seen today for a preventive health examination  Past Medical History  Diagnosis Date  . Allergy   . Hypertension   . Diabetes mellitus   . ED (erectile dysfunction)   . History of colonic polyps     Social History   Social History  . Marital Status: Married    Spouse Name: N/A  . Number of Children: N/A  . Years of Education: N/A   Occupational History  . Not on file.   Social History Main Topics  . Smoking status: Never Smoker   . Smokeless tobacco: Never Used  . Alcohol Use: Yes     Comment: socially  . Drug Use: No  . Sexual Activity: Not on file   Other Topics Concern  . Not on file   Social History Narrative    Past Surgical History  Procedure Laterality Date  . No prior surgery      Family History  Problem Relation Age of Onset  . Hypertension Mother   . Stroke Mother   . Dementia Father   . Hypertension Sister   . Diabetes Brother   . Hypertension Brother   . Cerebral palsy Daughter   . Cerebral palsy Daughter   . Colon cancer Neg Hx     No Known Allergies  Current Outpatient Prescriptions on File Prior to Visit  Medication Sig Dispense Refill  . amLODipine (NORVASC) 5 MG tablet TAKE 1 TABLET DAILY 90 tablet 3  . benazepril (LOTENSIN) 20 MG tablet TAKE 1 TABLET DAILY 90 tablet 1  . glucose blood (ONETOUCH VERIO) test strip Use as instructed 100 each 12  . INVOKAMET [phone] MG TABS TAKE 1 TABLET BY MOUTH 2 (TWO) TIMES DAILY. 180 tablet 1  . loratadine (CLARITIN) 10 MG tablet Take 10 mg by mouth daily as needed for allergies.    . Multiple Vitamin (MULTIVITAMIN) capsule Take 1 capsule by mouth daily.    [name] 1 each by Does not apply route 2 (two) times daily. 100 each 3  . sildenafil (VIAGRA) 100 MG tablet Take 1 tablet (100 mg total) by mouth daily as needed. 10 tablet 6   No  current facility-administered medications on file prior to visit.    BP 120/64 mmHg  Pulse 110  Temp(Src) 97.3 F (36.3 C) (Oral)  Resp 20  Ht 5' 10.5" (1.791 m)  Wt 183 lb (83.008 kg)  BMI 25.88 kg/m2  SpO2 97%   .  Preventive Screening-Counseling & Management  Alcohol-Tobacco  Smoking Status: never  Allergies (verified): No Known Drug Allergies  Past History:  Past Medical History:  Allergic rhinitis Hypertension Diabetes mellitus, type II Colonic polyps, hx of ED  Past Surgical History:  Denies surgical history colonoscopy 2009 2014   Family History:  Family History Hypertension Family History of Stroke F 1st degree relative <60 Family History Diabetes 1st degree relative father status post CABG at age 51; dementia -died age 83 mother status post stroke. History hypertension  One brother history type 2 diabetes and hypertension one sister is well- HTN  Social History:  Alcohol use-no Drug use-no Married two twin daughters with CP; wheelchair bound Smoking Status: never   Review of Systems  Constitutional: Negative for fever, chills, activity change, appetite change and fatigue.  HENT: Negative for congestion, dental problem, ear  pain, hearing loss, mouth sores, rhinorrhea, sinus pressure, sneezing, tinnitus, trouble swallowing and voice change.   Eyes: Negative for photophobia, pain, redness and visual disturbance.  Respiratory: Negative for apnea, cough, choking, chest tightness, shortness of breath and wheezing.   Cardiovascular: Negative for chest pain, palpitations and leg swelling.  Gastrointestinal: Negative for nausea, vomiting, abdominal pain, diarrhea, constipation, blood in stool, abdominal distention, anal bleeding and rectal pain.  Genitourinary: Negative for dysuria, urgency, frequency, hematuria, flank pain, decreased urine volume, discharge, penile swelling, scrotal swelling, difficulty urinating, genital sores and  testicular pain.  Musculoskeletal: Negative for myalgias, back pain, joint swelling, arthralgias, gait problem, neck pain and neck stiffness.  Skin: Negative for color change, rash and wound.  Neurological: Negative for dizziness, tremors, seizures, syncope, facial asymmetry, speech difficulty, weakness, light-headedness, numbness and headaches.  Hematological: Negative for adenopathy. Does not bruise/bleed easily.  Psychiatric/Behavioral: Negative for suicidal ideas, hallucinations, behavioral problems, confusion, sleep disturbance, self-injury, dysphoric mood, decreased concentration and agitation. The patient is not nervous/anxious.        Objective:   Physical Exam  Constitutional: He appears well-developed and well-nourished.  HENT:  Head: Normocephalic and atraumatic.  Right Ear: External ear normal.  Left Ear: External ear normal.  Nose: Nose normal.  Mouth/Throat: Oropharynx is clear and moist.  Eyes: Conjunctivae and EOM are normal. Pupils are equal, round, and reactive to light. No scleral icterus.  Neck: Normal range of motion. Neck supple. No JVD present. No thyromegaly present.  Cardiovascular: Regular rhythm, normal heart sounds and intact distal pulses.  Exam reveals no gallop and no friction rub.   No murmur heard. Pulmonary/Chest: Effort normal and breath sounds normal. He exhibits no tenderness.  Abdominal: Soft. Bowel sounds are normal. He exhibits no distension and no mass. There is no tenderness.  Genitourinary: Prostate normal and penis normal.  Musculoskeletal: Normal range of motion. He exhibits no edema or tenderness.  Lymphadenopathy:    He has no cervical adenopathy.  Neurological: He is alert. He has normal reflexes. No cranial nerve deficit. Coordination normal.  Skin: Skin is warm and dry. No rash noted.  Psychiatric: He has a normal mood and affect. His behavior is normal.          Assessment & Plan:   Preventive health examination dIABETES   mellitus type II, suboptimal control.  Will start trulICITY   statin therapy again discussed Hypertension, stable History of colonic polyps.  Follow-up colonoscopy September 2014.  10 year interval recommended  Recheck 6 months

## 2015-03-17 NOTE — Patient Instructions (Addendum)
Limit your sodium (Salt) intake    It is important that you exercise regularly, at least 20 minutes 3 to 4 times per week.  If you develop chest pain or shortness of breath seek  medical attention.  Please check your blood pressure on a regular basis.  If it is consistently greater than 150/90, please make an office appointment.   Please check your hemoglobin A1c every 3- 6  monthsHealth Maintenance, Male A healthy lifestyle and preventative care can promote health and wellness.  Maintain regular health, dental, and eye exams.  Eat a healthy diet. Foods like vegetables, fruits, whole grains, low-fat dairy products, and lean protein foods contain the nutrients you need and are low in calories. Decrease your intake of foods high in solid fats, added sugars, and salt. Get information about a proper diet from your health care provider, if necessary.  Regular physical exercise is one of the most important things you can do for your health. Most adults should get at least 150 minutes of moderate-intensity exercise (any activity that increases your heart rate and causes you to sweat) each week. In addition, most adults need muscle-strengthening exercises on 2 or more days a week.   Maintain a healthy weight. The body mass index (BMI) is a screening tool to identify possible weight problems. It provides an estimate of body fat based on height and weight. Your health care provider can find your BMI and can help you achieve or maintain a healthy weight. For males 20 years and older:  A BMI below 18.5 is considered underweight.  A BMI of 18.5 to 24.9 is normal.  A BMI of 25 to 29.9 is considered overweight.  A BMI of 30 and above is considered obese.  Maintain normal blood lipids and cholesterol by exercising and minimizing your intake of saturated fat. Eat a balanced diet with plenty of fruits and vegetables. Blood tests for lipids and cholesterol should begin at age 59 and be repeated every 5 years.  If your lipid or cholesterol levels are high, you are over age 59, or you are at high risk for heart disease, you may need your cholesterol levels checked more frequently.Ongoing high lipid and cholesterol levels should be treated with medicines if diet and exercise are not working.  If you smoke, find out from your health care provider how to quit. If you do not use tobacco, do not start.  Lung cancer screening is recommended for adults aged 55-80 years who are at high risk for developing lung cancer because of a history of smoking. A yearly low-dose CT scan of the lungs is recommended for people who have at least a 30-pack-year history of smoking and are current smokers or have quit within the past 15 years. A pack year of smoking is smoking an average of 1 pack of cigarettes a day for 1 year (for example, a 30-pack-year history of smoking could mean smoking 1 pack a day for 30 years or 2 packs a day for 15 years). Yearly screening should continue until the smoker has stopped smoking for at least 15 years. Yearly screening should be stopped for people who develop a health problem that would prevent them from having lung cancer treatment.  If you choose to drink alcohol, do not have more than 2 drinks per day. One drink is considered to be 12 oz (360 mL) of beer, 5 oz (150 mL) of wine, or 1.5 oz (45 mL) of liquor.  Avoid the use of street drugs.  Do not share needles with anyone. Ask for help if you need support or instructions about stopping the use of drugs.  High blood pressure causes heart disease and increases the risk of stroke. High blood pressure is more likely to develop in:  People who have blood pressure in the end of the normal range (100-139/85-89 mm Hg).  People who are overweight or obese.  People who are African American.  If you are 52-74 years of age, have your blood pressure checked every 3-5 years. If you are 25 years of age or older, have your blood pressure checked every  year. You should have your blood pressure measured twice--once when you are at a hospital or clinic, and once when you are not at a hospital or clinic. Record the average of the two measurements. To check your blood pressure when you are not at a hospital or clinic, you can use:  An automated blood pressure machine at a pharmacy.  A home blood pressure monitor.  If you are 53-53 years old, ask your health care provider if you should take aspirin to prevent heart disease.  Diabetes screening involves taking a blood sample to check your fasting blood sugar level. This should be done once every 3 years after age 5 if you are at a normal weight and without risk factors for diabetes. Testing should be considered at a younger age or be carried out more frequently if you are overweight and have at least 1 risk factor for diabetes.  Colorectal cancer can be detected and often prevented. Most routine colorectal cancer screening begins at the age of 28 and continues through age 40. However, your health care provider may recommend screening at an earlier age if you have risk factors for colon cancer. On a yearly basis, your health care provider may provide home test kits to check for hidden blood in the stool. A small camera at the end of a tube may be used to directly examine the colon (sigmoidoscopy or colonoscopy) to detect the earliest forms of colorectal cancer. Talk to your health care provider about this at age 64 when routine screening begins. A direct exam of the colon should be repeated every 5-10 years through age 70, unless early forms of precancerous polyps or small growths are found.  People who are at an increased risk for hepatitis B should be screened for this virus. You are considered at high risk for hepatitis B if:  You were born in a country where hepatitis B occurs often. Talk with your health care provider about which countries are considered high risk.  Your parents were born in a  high-risk country and you have not received a shot to protect against hepatitis B (hepatitis B vaccine).  You have HIV or AIDS.  You use needles to inject street drugs.  You live with, or have sex with, someone who has hepatitis B.  You are a man who has sex with other men (MSM).  You get hemodialysis treatment.  You take certain medicines for conditions like cancer, organ transplantation, and autoimmune conditions.  Hepatitis C blood testing is recommended for all people born from [birth year] through 1965 and any individual with known risk factors for hepatitis C.  Healthy men should no longer receive prostate-specific antigen (PSA) blood tests as part of routine cancer screening. Talk to your health care provider about prostate cancer screening.  Testicular cancer screening is not recommended for adolescents or adult males who have no symptoms. Screening includes self-exam,  a health care provider exam, and other screening tests. Consult with your health care provider about any symptoms you have or any concerns you have about testicular cancer.  Practice safe sex. Use condoms and avoid high-risk sexual practices to reduce the spread of sexually transmitted infections (STIs).  You should be screened for STIs, including gonorrhea and chlamydia if:  You are sexually active and are younger than 24 years.  You are older than 24 years, and your health care provider tells you that you are at risk for this type of infection.  Your sexual activity has changed since you were last screened, and you are at an increased risk for chlamydia or gonorrhea. Ask your health care provider if you are at risk.  If you are at risk of being infected with HIV, it is recommended that you take a prescription medicine daily to prevent HIV infection. This is called pre-exposure prophylaxis (PrEP). You are considered at risk if:  You are a man who has sex with other men (MSM).  You are a heterosexual man who is  sexually active with multiple partners.  You take drugs by injection.  You are sexually active with a partner who has HIV.  Talk with your health care provider about whether you are at high risk of being infected with HIV. If you choose to begin PrEP, you should first be tested for HIV. You should then be tested every 3 months for as long as you are taking PrEP.  Use sunscreen. Apply sunscreen liberally and repeatedly throughout the day. You should seek shade when your shadow is shorter than you. Protect yourself by wearing long sleeves, pants, a wide-brimmed hat, and sunglasses year round whenever you are outdoors.  Tell your health care provider of new moles or changes in moles, especially if there is a change in shape or color. Also, tell your health care provider if a mole is larger than the size of a pencil eraser.  A one-time screening for abdominal aortic aneurysm (AAA) and surgical repair of large AAAs by ultrasound is recommended for men aged 29-75 years who are current or former smokers.  Stay current with your vaccines (immunizations).   This information is not intended to replace advice given to you by your health care provider. Make sure you discuss any questions you have with your health care provider.   Document Released: 09/28/2007 Document Revised: 04/22/2014 Document Reviewed: 08/27/2010 Elsevier Interactive Patient Education [employer].

## 2015-07-17 ENCOUNTER — Ambulatory Visit (INDEPENDENT_AMBULATORY_CARE_PROVIDER_SITE_OTHER): Payer: 59 | Admitting: Internal Medicine

## 2015-07-17 ENCOUNTER — Encounter: Payer: Self-pay | Admitting: Internal Medicine

## 2015-07-17 VITALS — BP 118/64 | HR 95 | Temp 98.0°F | Resp 20 | Ht 70.5 in | Wt 178.0 lb

## 2015-07-17 DIAGNOSIS — Z86010 Personal history of colonic polyps: Secondary | ICD-10-CM

## 2015-07-17 DIAGNOSIS — E119 Type 2 diabetes mellitus without complications: Secondary | ICD-10-CM | POA: Diagnosis not present

## 2015-07-17 LAB — HEMOGLOBIN A1C: Hgb A1c MFr Bld: 6.8 % — ABNORMAL HIGH (ref 4.6–6.5)

## 2015-07-17 MED ORDER — BENAZEPRIL HCL 20 MG PO TABS: 20.0000 mg | ORAL_TABLET | Freq: Every day | ORAL | Status: DC

## 2015-07-17 MED ORDER — SILDENAFIL CITRATE 100 MG PO TABS: 100.0000 mg | ORAL_TABLET | Freq: Every day | ORAL | Status: DC | PRN

## 2015-07-17 NOTE — Progress Notes (Signed)
Subjective:    Patient ID: Justin [name], male    DOB: 1955/06/04, 60 y.o.   MRN: [id number]  HPI  Lab Results  Component Value Date   HGBA1C 7.2* 02/21/2015    Wt Readings from Last 3 Encounters:  07/17/15 178 lb (80.74 kg)  03/17/15 183 lb (83.008 kg)  08/23/14 182 lb (82.24 kg)     60 year old patient who is seen today in follow-up of type 2 diabetes.  Trulicity was added to his regimen in November.  There is been some modest weight loss.  He generally feels well.  No new concerns or complaints.  He is due for follow-up eye examination in July of as this year  Past Medical History  Diagnosis Date  . Allergy   . Hypertension   . Diabetes mellitus   . ED (erectile dysfunction)   . History of colonic polyps     Social History   Social History  . Marital Status: Married    Spouse Name: N/A  . Number of Children: N/A  . Years of Education: N/A   Occupational History  . Not on file.   Social History Main Topics  . Smoking status: Never Smoker   . Smokeless tobacco: Never Used  . Alcohol Use: Yes     Comment: socially  . Drug Use: No  . Sexual Activity: Not on file   Other Topics Concern  . Not on file   Social History Narrative    Past Surgical History  Procedure Laterality Date  . No prior surgery      Family History  Problem Relation Age of Onset  . Hypertension Mother   . Stroke Mother   . Dementia Father   . Hypertension Sister   . Diabetes Brother   . Hypertension Brother   . Cerebral palsy Daughter   . Cerebral palsy Daughter   . Colon cancer Neg Hx     No Known Allergies  Current Outpatient Prescriptions on File Prior to Visit  Medication Sig Dispense Refill  . amLODipine (NORVASC) 5 MG tablet TAKE 1 TABLET DAILY 90 tablet 3  . Dulaglutide (TRULICITY) 0.75 MG/0.5ML SOPN Inject 0.75 mg into the skin once a week. 12 pen 5  . glucose blood (ONETOUCH VERIO) test strip Use as instructed 100 each 12  . INVOKAMET [phone] MG TABS TAKE  1 TABLET BY MOUTH 2 (TWO) TIMES DAILY. 180 tablet 1  . loratadine (CLARITIN) 10 MG tablet Take 10 mg by mouth daily as needed for allergies.    . Multiple Vitamin (MULTIVITAMIN) capsule Take 1 capsule by mouth daily.    [name] 1 each by Does not apply route 2 (two) times daily. 100 each 3   No current facility-administered medications on file prior to visit.    BP 118/64 mmHg  Pulse 95  Temp(Src) 98 F (36.7 C) (Oral)  Resp 20  Ht 5' 10.5" (1.791 m)  Wt 178 lb (80.74 kg)  BMI 25.17 kg/m2  SpO2 99%     Review of Systems  Constitutional: Negative for fever, chills, appetite change and fatigue.  HENT: Negative for congestion, dental problem, ear pain, hearing loss, sore throat, tinnitus, trouble swallowing and voice change.   Eyes: Negative for pain, discharge and visual disturbance.  Respiratory: Negative for cough, chest tightness, wheezing and stridor.   Cardiovascular: Negative for chest pain, palpitations and leg swelling.  Gastrointestinal: Negative for nausea, vomiting, abdominal pain, diarrhea, constipation, blood in stool and abdominal  distention.  Genitourinary: Negative for urgency, hematuria, flank pain, discharge, difficulty urinating and genital sores.  Musculoskeletal: Negative for myalgias, back pain, joint swelling, arthralgias, gait problem and neck stiffness.  Skin: Negative for rash.  Neurological: Negative for dizziness, syncope, speech difficulty, weakness, numbness and headaches.  Hematological: Negative for adenopathy. Does not bruise/bleed easily.  Psychiatric/Behavioral: Negative for behavioral problems and dysphoric mood. The patient is not nervous/anxious.        Objective:   Physical Exam  Constitutional: He is oriented to person, place, and time. He appears well-developed.  HENT:  Head: Normocephalic.  Right Ear: External ear normal.  Left Ear: External ear normal.  Eyes: Conjunctivae and EOM are normal.  Neck:  Normal range of motion.  Cardiovascular: Normal rate and normal heart sounds.   Pulmonary/Chest: Breath sounds normal.  Abdominal: Bowel sounds are normal.  Musculoskeletal: Normal range of motion. He exhibits no edema or tenderness.  Neurological: He is alert and oriented to person, place, and time.  Psychiatric: He has a normal mood and affect. His behavior is normal.          Assessment & Plan:  Diabetes mellitus.  Will check a hemoglobin A1c Hypertension.  Excellent control  Recheck 6 months

## 2015-07-17 NOTE — Progress Notes (Signed)
   Subjective:    Patient ID: Justin [name], male    DOB: 25-Dec-1955, 60 y.o.   MRN: [id number]  HPI  Wt Readings from Last 3 Encounters:  07/17/15 178 lb (80.74 kg)  03/17/15 183 lb (83.008 kg)  08/23/14 182 lb (82.555 kg)    Lab Results  Component Value Date   HGBA1C 7.2* 02/21/2015    Review of Systems     Objective:   Physical Exam        Assessment & Plan:

## 2015-07-17 NOTE — Progress Notes (Signed)
Pre visit review using our clinic review tool, if applicable. No additional management support is needed unless otherwise documented below in the visit note. 

## 2015-07-17 NOTE — Patient Instructions (Signed)
Please check your hemoglobin A1c every 3-6  Months  Limit your sodium (Salt) intake    It is important that you exercise regularly, at least 20 minutes 3 to 4 times per week.  If you develop chest pain or shortness of breath seek  medical attention.     

## 2015-08-18 ENCOUNTER — Other Ambulatory Visit: Payer: Self-pay | Admitting: Internal Medicine

## 2015-09-02 ENCOUNTER — Other Ambulatory Visit: Payer: Self-pay | Admitting: Internal Medicine

## 2015-10-27 ENCOUNTER — Other Ambulatory Visit: Payer: Self-pay | Admitting: Internal Medicine

## 2015-12-12 LAB — HM DIABETES EYE EXAM

## 2015-12-21 ENCOUNTER — Encounter: Payer: Self-pay | Admitting: Internal Medicine

## 2016-01-15 ENCOUNTER — Ambulatory Visit: Payer: 59 | Admitting: Internal Medicine

## 2016-01-16 ENCOUNTER — Other Ambulatory Visit: Payer: Self-pay | Admitting: Internal Medicine

## 2016-01-25 ENCOUNTER — Other Ambulatory Visit: Payer: Self-pay | Admitting: Internal Medicine

## 2016-01-29 ENCOUNTER — Ambulatory Visit (INDEPENDENT_AMBULATORY_CARE_PROVIDER_SITE_OTHER): Payer: 59 | Admitting: Internal Medicine

## 2016-01-29 ENCOUNTER — Encounter: Payer: Self-pay | Admitting: Internal Medicine

## 2016-01-29 VITALS — BP 140/60 | HR 88 | Temp 97.8°F | Ht 70.5 in | Wt 178.0 lb

## 2016-01-29 DIAGNOSIS — I10 Essential (primary) hypertension: Secondary | ICD-10-CM | POA: Diagnosis not present

## 2016-01-29 DIAGNOSIS — E119 Type 2 diabetes mellitus without complications: Secondary | ICD-10-CM | POA: Diagnosis not present

## 2016-01-29 DIAGNOSIS — Z23 Encounter for immunization: Secondary | ICD-10-CM | POA: Diagnosis not present

## 2016-01-29 LAB — HEMOGLOBIN A1C: Hgb A1c MFr Bld: 6.4 % (ref 4.6–6.5)

## 2016-01-29 NOTE — Progress Notes (Signed)
Subjective:    Patient ID: Justin [name], male    DOB: [birth year], 60 y.o.   MRN: [id number]  HPI  60 year old patient who has type 2 diabetes.  He has done well.  Last hemoglobin A1c 6.8.  He has essential hypertension which has been well-controlled on his present regimen.  No concerns or complaints.  Past Medical History:  Diagnosis Date  . Allergy   . Diabetes mellitus   . ED (erectile dysfunction)   . History of colonic polyps   . Hypertension      Social History   Social History  . Marital status: Married    Spouse name: N/A  . Number of children: N/A  . Years of education: N/A   Occupational History  . Not on file.   Social History Main Topics  . Smoking status: Never Smoker  . Smokeless tobacco: Never Used  . Alcohol use Yes     Comment: socially  . Drug use: No  . Sexual activity: Not on file   Other Topics Concern  . Not on file   Social History Narrative  . No narrative on file    Past Surgical History:  Procedure Laterality Date  . no prior surgery      Family History  Problem Relation Age of Onset  . Hypertension Mother   . Stroke Mother   . Dementia Father   . Hypertension Sister   . Diabetes Brother   . Hypertension Brother   . Cerebral palsy Daughter   . Cerebral palsy Daughter   . Colon cancer Neg Hx     No Known Allergies  Current Outpatient Prescriptions on File Prior to Visit  Medication Sig Dispense Refill  . amLODipine (NORVASC) 5 MG tablet TAKE 1 TABLET DAILY 90 tablet 0  . benazepril (LOTENSIN) 20 MG tablet Take 1 tablet (20 mg total) by mouth daily. 90 tablet 1  . glucose blood (ONETOUCH VERIO) test strip Use as instructed 100 each 12  . INVOKAMET [phone] MG TABS TAKE 1 TABLET BY MOUTH 2 (TWO) TIMES DAILY. 180 tablet 1  . loratadine (CLARITIN) 10 MG tablet Take 10 mg by mouth daily as needed for allergies.    . Multiple Vitamin (MULTIVITAMIN) capsule Take 1 capsule by mouth daily.    Letta Pate DELICA LANCETS FINE  MISC 1 each by Does not apply route 2 (two) times daily. 100 each 3  . sildenafil (VIAGRA) 100 MG tablet Take 1 tablet (100 mg total) by mouth daily as needed. 10 tablet 6  . TRULICITY 0.75 MG/0.5ML SOPN INJECT 0.75 MG INTO THE SKIN ONCE A WEEK. 12 pen 5   No current facility-administered medications on file prior to visit.     BP 140/60 (BP Location: Left Arm, Patient Position: Sitting, Cuff Size: Normal)   Pulse 88   Temp 97.8 F (36.6 C) (Oral)   Ht 5' 10.5" (1.791 m)   Wt 178 lb (80.7 kg)   SpO2 99%   BMI 25.18 kg/m     Review of Systems  Constitutional: Negative for appetite change, chills, fatigue and fever.  HENT: Negative for congestion, dental problem, ear pain, hearing loss, sore throat, tinnitus, trouble swallowing and voice change.   Eyes: Negative for pain, discharge and visual disturbance.  Respiratory: Negative for cough, chest tightness, wheezing and stridor.   Cardiovascular: Negative for chest pain, palpitations and leg swelling.  Gastrointestinal: Negative for abdominal distention, abdominal pain, blood in stool, constipation, diarrhea, nausea and vomiting.  Genitourinary:  Negative for difficulty urinating, discharge, flank pain, genital sores, hematuria and urgency.  Musculoskeletal: Negative for arthralgias, back pain, gait problem, joint swelling, myalgias and neck stiffness.  Skin: Negative for rash.  Neurological: Negative for dizziness, syncope, speech difficulty, weakness, numbness and headaches.  Hematological: Negative for adenopathy. Does not bruise/bleed easily.  Psychiatric/Behavioral: Negative for behavioral problems and dysphoric mood. The patient is not nervous/anxious.        Objective:   Physical Exam  Constitutional: He is oriented to person, place, and time. He appears well-developed.  HENT:  Head: Normocephalic.  Right Ear: External ear normal.  Left Ear: External ear normal.  Eyes: Conjunctivae and EOM are normal.  Neck: Normal range  of motion.  Cardiovascular: Normal rate and normal heart sounds.   Pulmonary/Chest: Breath sounds normal.  Abdominal: Bowel sounds are normal.  Musculoskeletal: Normal range of motion. He exhibits no edema or tenderness.  Neurological: He is alert and oriented to person, place, and time.  Psychiatric: He has a normal mood and affect. His behavior is normal.          Assessment & Plan:   Diabetes mellitus.  Appears to be well controlled.  Will check hemoglobin A1c Essential hypertension, stable.  No change in medicine  CPX 6 months  Rogelia BogaKWIATKOWSKI,Brenlynn Fake FRANK

## 2016-01-29 NOTE — Patient Instructions (Signed)
Limit your sodium (Salt) intake   Please check your hemoglobin A1c every 3-6  Months  Return in 6 months for follow-up     

## 2016-01-29 NOTE — Progress Notes (Signed)
Pre visit review using our clinic review tool, if applicable. No additional management support is needed unless otherwise documented below in the visit note. 

## 2016-02-16 ENCOUNTER — Other Ambulatory Visit: Payer: Self-pay | Admitting: Internal Medicine

## 2016-04-24 ENCOUNTER — Other Ambulatory Visit: Payer: Self-pay | Admitting: Internal Medicine

## 2016-07-14 ENCOUNTER — Other Ambulatory Visit: Payer: Self-pay | Admitting: Internal Medicine

## 2016-07-19 ENCOUNTER — Other Ambulatory Visit: Payer: Self-pay | Admitting: Internal Medicine

## 2016-07-19 DIAGNOSIS — E139 Other specified diabetes mellitus without complications: Secondary | ICD-10-CM

## 2016-07-19 DIAGNOSIS — I10 Essential (primary) hypertension: Secondary | ICD-10-CM

## 2016-07-22 ENCOUNTER — Other Ambulatory Visit (INDEPENDENT_AMBULATORY_CARE_PROVIDER_SITE_OTHER): Payer: 59

## 2016-07-22 DIAGNOSIS — E139 Other specified diabetes mellitus without complications: Secondary | ICD-10-CM

## 2016-07-22 DIAGNOSIS — I10 Essential (primary) hypertension: Secondary | ICD-10-CM | POA: Diagnosis not present

## 2016-07-22 LAB — MICROALBUMIN / CREATININE URINE RATIO
Creatinine,U: 92.3 mg/dL
Microalb Creat Ratio: 2.7 mg/g (ref 0.0–30.0)
Microalb, Ur: 2.5 mg/dL — ABNORMAL HIGH (ref 0.0–1.9)

## 2016-07-22 LAB — TSH: TSH: 1.67 u[IU]/mL (ref 0.35–4.50)

## 2016-07-22 LAB — HEPATIC FUNCTION PANEL
ALT: 13 U/L (ref 0–53)
AST: 11 U/L (ref 0–37)
Albumin: 4.3 g/dL (ref 3.5–5.2)
Alkaline Phosphatase: 43 U/L (ref 39–117)
Bilirubin, Direct: 0.1 mg/dL (ref 0.0–0.3)
Total Bilirubin: 0.5 mg/dL (ref 0.2–1.2)
Total Protein: 6.6 g/dL (ref 6.0–8.3)

## 2016-07-22 LAB — CBC
HCT: 45.6 % (ref 39.0–52.0)
Hemoglobin: 15.7 g/dL (ref 13.0–17.0)
MCHC: 34.4 g/dL (ref 30.0–36.0)
MCV: 91 fl (ref 78.0–100.0)
Platelets: 240 10*3/uL (ref 150.0–400.0)
RBC: 5.01 Mil/uL (ref 4.22–5.81)
RDW: 13.5 % (ref 11.5–15.5)
WBC: 4.9 10*3/uL (ref 4.0–10.5)

## 2016-07-22 LAB — HEMOGLOBIN A1C: Hgb A1c MFr Bld: 7 % — ABNORMAL HIGH (ref 4.6–6.5)

## 2016-07-22 LAB — BASIC METABOLIC PANEL
BUN: 26 mg/dL — ABNORMAL HIGH (ref 6–23)
CO2: 24 mEq/L (ref 19–32)
Calcium: 9.3 mg/dL (ref 8.4–10.5)
Chloride: 106 mEq/L (ref 96–112)
Creatinine, Ser: 0.87 mg/dL (ref 0.40–1.50)
GFR: 94.8 mL/min (ref >60.00)
Glucose, Bld: 158 mg/dL — ABNORMAL HIGH (ref 70–99)
Potassium: 4.4 mEq/L (ref 3.5–5.1)
Sodium: 139 mEq/L (ref 135–145)

## 2016-07-22 LAB — LIPID PANEL
Cholesterol: 196 mg/dL (ref 0–200)
HDL: 55.1 mg/dL (ref >39.00)
LDL Cholesterol: 131 mg/dL — ABNORMAL HIGH (ref 0–99)
NonHDL: 140.46
Total CHOL/HDL Ratio: 4
Triglycerides: 46 mg/dL (ref 0.0–149.0)
VLDL: 9.2 mg/dL (ref 0.0–40.0)

## 2016-07-23 ENCOUNTER — Other Ambulatory Visit: Payer: Self-pay | Admitting: Internal Medicine

## 2016-07-29 ENCOUNTER — Ambulatory Visit (INDEPENDENT_AMBULATORY_CARE_PROVIDER_SITE_OTHER): Payer: 59 | Admitting: Internal Medicine

## 2016-07-29 ENCOUNTER — Encounter: Payer: Self-pay | Admitting: Internal Medicine

## 2016-07-29 VITALS — BP 118/58 | HR 103 | Temp 98.1°F | Ht 71.5 in | Wt 175.6 lb

## 2016-07-29 DIAGNOSIS — Z0000 Encounter for general adult medical examination without abnormal findings: Secondary | ICD-10-CM | POA: Diagnosis not present

## 2016-07-29 MED ORDER — DULAGLUTIDE 1.5 MG/0.5ML ~~LOC~~ SOAJ: 1.5000 mg | SUBCUTANEOUS | 6 refills | Status: DC

## 2016-07-29 MED ORDER — DULAGLUTIDE 0.75 MG/0.5ML ~~LOC~~ SOAJ: 1.5000 mg | SUBCUTANEOUS | 5 refills | Status: DC

## 2016-07-29 MED ORDER — ATORVASTATIN CALCIUM 20 MG PO TABS: 20.0000 mg | ORAL_TABLET | Freq: Every day | ORAL | 3 refills | Status: DC

## 2016-07-29 NOTE — Progress Notes (Signed)
Subjective:    Patient ID: Justin [name], male    DOB: [birth year], 61 y.o.   MRN: [id number]  HPI  61 year old patient who is seen today for a wellness exam Medical problems include type 2 diabetes. He has essential hypertension and also a history colonic polyps.  Has had up-to-date colonoscopies. No new concerns or complaints  Past Medical History:  Diagnosis Date  . Allergy   . Diabetes mellitus   . ED (erectile dysfunction)   . History of colonic polyps   . Hypertension      Social History   Social History  . Marital status: Married    Spouse name: N/A  . Number of children: N/A  . Years of education: N/A   Occupational History  . Not on file.   Social History Main Topics  . Smoking status: Never Smoker  . Smokeless tobacco: Never Used  . Alcohol use Yes     Comment: socially  . Drug use: No  . Sexual activity: Not on file   Other Topics Concern  . Not on file   Social History Narrative  . No narrative on file    Past Surgical History:  Procedure Laterality Date  . no prior surgery      Family History  Problem Relation Age of Onset  . Hypertension Mother   . Stroke Mother   . Dementia Father   . Hypertension Sister   . Diabetes Brother   . Hypertension Brother   . Cerebral palsy Daughter   . Cerebral palsy Daughter   . Colon cancer Neg Hx     No Known Allergies  Current Outpatient Prescriptions on File Prior to Visit  Medication Sig Dispense Refill  . amLODipine (NORVASC) 5 MG tablet TAKE 1 TABLET DAILY 90 tablet 0  . benazepril (LOTENSIN) 20 MG tablet TAKE 1 TABLET DAILY 90 tablet 1  . glucose blood (ONETOUCH VERIO) test strip Use as instructed 100 each 12  . INVOKAMET [phone] MG TABS TAKE 1 TABLET BY MOUTH 2 (TWO) TIMES DAILY. 180 tablet 1  . loratadine (CLARITIN) 10 MG tablet Take 10 mg by mouth daily as needed for allergies.    . Multiple Vitamin (MULTIVITAMIN) capsule Take 1 capsule by mouth daily.    Letta Pate DELICA LANCETS  FINE MISC 1 each by Does not apply route 2 (two) times daily. 100 each 3  . sildenafil (VIAGRA) 100 MG tablet Take 1 tablet (100 mg total) by mouth daily as needed. 10 tablet 6  . TRULICITY 0.75 MG/0.5ML SOPN INJECT 0.75 MG INTO THE SKIN ONCE A WEEK. 12 pen 5   No current facility-administered medications on file prior to visit.     BP (!) 118/58 (BP Location: Left Arm, Patient Position: Sitting, Cuff Size: Large)   Pulse (!) 103   Temp 98.1 F (36.7 C) (Oral)   Ht 5' 11.5" (1.816 m)   Wt 175 lb 9.6 oz (79.7 kg)   SpO2 96%   BMI 24.15 kg/m      Review of Systems  Constitutional: Negative for appetite change, chills, fatigue and fever.  HENT: Negative for congestion, dental problem, ear pain, hearing loss, sore throat, tinnitus, trouble swallowing and voice change.   Eyes: Negative for pain, discharge and visual disturbance.  Respiratory: Negative for cough, chest tightness, wheezing and stridor.   Cardiovascular: Negative for chest pain, palpitations and leg swelling.  Gastrointestinal: Positive for constipation. Negative for abdominal distention, abdominal pain, blood in stool, diarrhea, nausea and  vomiting.  Genitourinary: Negative for difficulty urinating, discharge, flank pain, genital sores, hematuria and urgency.  Musculoskeletal: Negative for arthralgias, back pain, gait problem, joint swelling, myalgias and neck stiffness.  Skin: Negative for rash.  Neurological: Negative for dizziness, syncope, speech difficulty, weakness, numbness and headaches.  Hematological: Negative for adenopathy. Does not bruise/bleed easily.  Psychiatric/Behavioral: Negative for behavioral problems and dysphoric mood. The patient is not nervous/anxious.        Objective:   Physical Exam  Constitutional: He appears well-developed and well-nourished.  HENT:  Head: Normocephalic and atraumatic.  Right Ear: External ear normal.  Left Ear: External ear normal.  Nose: Nose normal.  Mouth/Throat:  Oropharynx is clear and moist.  Eyes: Conjunctivae and EOM are normal. Pupils are equal, round, and reactive to light. No scleral icterus.  Neck: Normal range of motion. Neck supple. No JVD present. No thyromegaly present.  Cardiovascular: Regular rhythm, normal heart sounds and intact distal pulses.  Exam reveals no gallop and no friction rub.   No murmur heard. Pulmonary/Chest: Effort normal and breath sounds normal. He exhibits no tenderness.  Abdominal: Soft. Bowel sounds are normal. He exhibits no distension and no mass. There is no tenderness.  Genitourinary: Prostate normal and penis normal. Rectal exam shows guaiac negative stool.  Musculoskeletal: Normal range of motion. He exhibits no edema or tenderness.  Lymphadenopathy:    He has no cervical adenopathy.  Neurological: He is alert. He has normal reflexes. No cranial nerve deficit. Coordination normal.  Skin: Skin is warm and dry. No rash noted.  Psychiatric: He has a normal mood and affect. His behavior is normal.          Assessment & Plan:   Preventive health examination Diabetes mellitus.  Hemoglobin A1c has increased to 7.0.  Will increase Trulicity to 1.5 mg/wk.  Will discuss statin therapy Essential hypertension, well-controlled.  Will encourage home blood pressure monitoring.  May need dose reduction  Follow-up 6 months  Justin [name] Justin [name]

## 2016-07-29 NOTE — Progress Notes (Signed)
Pre visit review using our clinic review tool, if applicable. No additional management support is needed unless otherwise documented below in the visit note. 

## 2016-07-29 NOTE — Patient Instructions (Signed)
Limit your sodium (Salt) intake    It is important that you exercise regularly, at least 20 minutes 3 to 4 times per week.  If you develop chest pain or shortness of breath seek  medical attention.   Please check your hemoglobin A1c every 3-6  Months  Please check your blood pressure on a regular basis.  If it is consistently greater than 150/90, please make an office appointment.   Return in 6 months for follow-up

## 2016-08-15 ENCOUNTER — Other Ambulatory Visit: Payer: Self-pay | Admitting: Internal Medicine

## 2016-10-09 ENCOUNTER — Other Ambulatory Visit: Payer: Self-pay | Admitting: Family Medicine

## 2016-10-09 NOTE — Telephone Encounter (Signed)
Received request from [facility] for 90 day rx.

## 2016-10-11 ENCOUNTER — Other Ambulatory Visit: Payer: Self-pay

## 2016-10-11 MED ORDER — AMLODIPINE BESYLATE 5 MG PO TABS: 5.0000 mg | ORAL_TABLET | Freq: Every day | ORAL | 1 refills | Status: DC

## 2016-10-11 NOTE — Telephone Encounter (Signed)
Rx has been sent in as directed.  

## 2016-10-11 NOTE — Telephone Encounter (Signed)
What medication

## 2016-10-25 ENCOUNTER — Telehealth: Payer: Self-pay | Admitting: Internal Medicine

## 2016-10-25 MED ORDER — ATORVASTATIN CALCIUM 20 MG PO TABS: 20.0000 mg | ORAL_TABLET | Freq: Every day | ORAL | 3 refills | Status: DC

## 2016-10-25 NOTE — Telephone Encounter (Signed)
Medication was refilled.

## 2016-10-25 NOTE — Telephone Encounter (Signed)
Pt needs new rx atorvastatin 20 mg #90 w/refills  [facility] to [facility]

## 2016-12-13 DIAGNOSIS — E119 Type 2 diabetes mellitus without complications: Secondary | ICD-10-CM | POA: Diagnosis not present

## 2016-12-13 LAB — HM DIABETES EYE EXAM

## 2016-12-31 ENCOUNTER — Encounter: Payer: Self-pay | Admitting: Internal Medicine

## 2017-01-02 ENCOUNTER — Encounter: Payer: Self-pay | Admitting: Internal Medicine

## 2017-01-06 ENCOUNTER — Ambulatory Visit (INDEPENDENT_AMBULATORY_CARE_PROVIDER_SITE_OTHER): Payer: 59

## 2017-01-06 DIAGNOSIS — Z23 Encounter for immunization: Secondary | ICD-10-CM | POA: Diagnosis not present

## 2017-01-11 ENCOUNTER — Other Ambulatory Visit: Payer: Self-pay | Admitting: Internal Medicine

## 2017-01-27 ENCOUNTER — Ambulatory Visit (INDEPENDENT_AMBULATORY_CARE_PROVIDER_SITE_OTHER): Payer: 59 | Admitting: Internal Medicine

## 2017-01-27 ENCOUNTER — Encounter: Payer: Self-pay | Admitting: Internal Medicine

## 2017-01-27 VITALS — BP 104/54 | HR 105 | Temp 97.9°F | Ht 71.5 in | Wt 171.0 lb

## 2017-01-27 DIAGNOSIS — I10 Essential (primary) hypertension: Secondary | ICD-10-CM | POA: Diagnosis not present

## 2017-01-27 DIAGNOSIS — E119 Type 2 diabetes mellitus without complications: Secondary | ICD-10-CM

## 2017-01-27 LAB — POCT GLYCOSYLATED HEMOGLOBIN (HGB A1C): Hemoglobin A1C: 5.9

## 2017-01-27 LAB — HEMOGLOBIN A1C: Hgb A1c MFr Bld: 6.2 % (ref 4.6–6.5)

## 2017-01-27 NOTE — Patient Instructions (Signed)
Limit your sodium (Salt) intake  Please check your blood pressure on a regular basis.  If it is consistently greater than 150/90, please make an office appointment.    It is important that you exercise regularly, at least 20 minutes 3 to 4 times per week.  If you develop chest pain or shortness of breath seek  medical attention.   Please check your hemoglobin A1c every 3-6  Months  Return in 6 months for follow-up

## 2017-01-27 NOTE — Progress Notes (Signed)
Subjective:    Patient ID: Justin [name], male    DOB: 1955/06/10, 61 y.o.   MRN: [id number]  HPI  Lab Results  Component Value Date   HGBA1C 7.0 (H) 07/22/2016   61 year old patient who is seen today for follow-up of type 2 diabetes.  He has essential hypertension.  Blood pressure readings have been in a low-normal range. He occasionally develops some dizziness when he stands from a stooped position.  Social history.  Recently retired  Past Medical History:  Diagnosis Date  . Allergy   . Diabetes mellitus   . ED (erectile dysfunction)   . History of colonic polyps   . Hypertension      Social History   Social History  . Marital status: Married    Spouse name: N/A  . Number of children: N/A  . Years of education: N/A   Occupational History  . Not on file.   Social History Main Topics  . Smoking status: Never Smoker  . Smokeless tobacco: Never Used  . Alcohol use Yes     Comment: socially  . Drug use: No  . Sexual activity: Not on file   Other Topics Concern  . Not on file   Social History Narrative  . No narrative on file    Past Surgical History:  Procedure Laterality Date  . no prior surgery      Family History  Problem Relation Age of Onset  . Hypertension Mother   . Stroke Mother   . Dementia Father   . Hypertension Sister   . Diabetes Brother   . Hypertension Brother   . Cerebral palsy Daughter   . Cerebral palsy Daughter   . Colon cancer Neg Hx     No Known Allergies  Current Outpatient Prescriptions on File Prior to Visit  Medication Sig Dispense Refill  . amLODipine (NORVASC) 5 MG tablet Take 1 tablet (5 mg total) by mouth daily. 90 tablet 1  . atorvastatin (LIPITOR) 20 MG tablet Take 1 tablet (20 mg total) by mouth daily. 90 tablet 3  . benazepril (LOTENSIN) 20 MG tablet TAKE 1 TABLET DAILY 90 tablet 1  . Dulaglutide (TRULICITY) 1.5 MG/0.5ML SOPN Inject 1.5 mg into the skin once a week. 4 pen 6  . glucose blood (ONETOUCH  VERIO) test strip Use as instructed 100 each 12  . INVOKAMET [phone] MG TABS TAKE 1 TABLET BY MOUTH 2 (TWO) TIMES DAILY. 180 tablet 1  . Multiple Vitamin (MULTIVITAMIN) capsule Take 1 capsule by mouth daily.    [name] 1 each by Does not apply route 2 (two) times daily. 100 each 3  . sildenafil (VIAGRA) 100 MG tablet Take 1 tablet (100 mg total) by mouth daily as needed. 10 tablet 6   No current facility-administered medications on file prior to visit.     BP (!) 104/54 (BP Location: Left Arm, Patient Position: Sitting, Cuff Size: Normal)   Pulse (!) 105   Temp 97.9 F (36.6 C) (Oral)   Ht 5' 11.5" (1.816 m)   Wt 171 lb (77.6 kg)   SpO2 98%   BMI 23.52 kg/m     Review of Systems  Constitutional: Negative for appetite change, chills, fatigue and fever.  HENT: Negative for congestion, dental problem, ear pain, hearing loss, sore throat, tinnitus, trouble swallowing and voice change.   Eyes: Negative for pain, discharge and visual disturbance.  Respiratory: Negative for cough, chest tightness, wheezing and stridor.  Cardiovascular: Negative for chest pain, palpitations and leg swelling.  Gastrointestinal: Negative for abdominal distention, abdominal pain, blood in stool, constipation, diarrhea, nausea and vomiting.  Genitourinary: Negative for difficulty urinating, discharge, flank pain, genital sores, hematuria and urgency.  Musculoskeletal: Negative for arthralgias, back pain, gait problem, joint swelling, myalgias and neck stiffness.  Skin: Negative for rash.  Neurological: Positive for dizziness and light-headedness. Negative for syncope, speech difficulty, weakness, numbness and headaches.  Hematological: Negative for adenopathy. Does not bruise/bleed easily.  Psychiatric/Behavioral: Negative for behavioral problems and dysphoric mood. The patient is not nervous/anxious.        Objective:   Physical Exam  Constitutional: He is oriented to person,  place, and time. He appears well-developed. No distress.  Blood pressure 108/60  HENT:  Head: Normocephalic.  Right Ear: External ear normal.  Left Ear: External ear normal.  Eyes: Conjunctivae and EOM are normal.  Neck: Normal range of motion.  Cardiovascular: Normal rate and normal heart sounds.   Pulmonary/Chest: Breath sounds normal.  Abdominal: Bowel sounds are normal.  Musculoskeletal: Normal range of motion. He exhibits no edema or tenderness.  Neurological: He is alert and oriented to person, place, and time.  Psychiatric: He has a normal mood and affect. His behavior is normal.          Assessment & Plan:   Essential hypertension.  Will continue ACE inhibitor and challenge off amlodipine.  Closer home blood pressure monitoring.  Encouraged Diabetes mellitus type 2.  Will review a hemoglobin A1c.  No change in therapy  Follow-up 6 months  Justin [name]

## 2017-02-03 ENCOUNTER — Other Ambulatory Visit: Payer: Self-pay | Admitting: Internal Medicine

## 2017-04-09 ENCOUNTER — Other Ambulatory Visit: Payer: Self-pay | Admitting: Internal Medicine

## 2017-05-01 ENCOUNTER — Telehealth: Payer: Self-pay | Admitting: Internal Medicine

## 2017-05-01 MED ORDER — BENAZEPRIL HCL 20 MG PO TABS: 20.0000 mg | ORAL_TABLET | Freq: Every day | ORAL | 1 refills | Status: DC

## 2017-05-01 NOTE — Telephone Encounter (Signed)
Copied from CRM [id number]. Topic: Quick Communication - See Telephone Encounter >> May 01, 2017  3:33 PM Cipriano BunkerLambe, Annette S wrote: CRM for notification. See Telephone encounter for:   Patient is needing refill benazepril (LOTENSIN) 20 MG tablet  90 days,    ( he stopped taking the amLODipine (NORVASC) 5 MG tablet)   [facility] [place] [facility] [id number] [facility] [address] [place] [id number] Phone: [phone] Fax: [phone]    05/01/17.

## 2017-05-13 ENCOUNTER — Telehealth: Payer: Self-pay | Admitting: Internal Medicine

## 2017-05-13 ENCOUNTER — Other Ambulatory Visit: Payer: Self-pay

## 2017-05-13 MED ORDER — DULAGLUTIDE 1.5 MG/0.5ML ~~LOC~~ SOAJ: 1.5000 mg | SUBCUTANEOUS | 6 refills | Status: DC

## 2017-05-13 MED ORDER — CANAGLIFLOZIN-METFORMIN HCL 150-1000 MG PO TABS: ORAL_TABLET | ORAL | 1 refills | Status: DC

## 2017-05-13 NOTE — Telephone Encounter (Signed)
Copied from CRM [phone]. Topic: Quick Communication - See Telephone Encounter >> May 13, 2017 10:25 AM Cipriano BunkerLambe, Annette S wrote: CRM for notification. See Telephone encounter for: .  [facility] get [facility] refilled since 1/18    Alease Medinaaris [facility] [phone] Ref  # [id number] asking for approval for refill on:   INVOKAMET [phone] MG TABS Dulaglutide (TRULICITY) 1.5 MG/0.5ML SOPN   [facility] [place] - [id number] [facility] [address] [place] [id number] Phone: [phone] Fax: [phone]   05/13/17.

## 2017-06-27 ENCOUNTER — Telehealth: Payer: Self-pay | Admitting: Internal Medicine

## 2017-06-27 NOTE — Telephone Encounter (Signed)
Copied from CRM [phone]. Topic: Quick Communication - Rx Refill/Question >> Jun 27, 2017  3:47 PM Raquel SarnaHayes, Teresa G wrote: Canagliflozin-Metformin HCl (INVOKAMET) [phone] MG TABS Dulaglutide (TRULICITY) 1.5 MG/0.5ML SOPN  Pre authorization on the Rx's above.  Will need to be done by the end of April or medications will lapse  [facility] [place] - [id number] [facility] [address] [place] [id number] Phone: [phone] Fax: [phone]

## 2017-06-30 NOTE — Telephone Encounter (Signed)
Prior auths sent to [email or web address]-for Trulicity-key-[id number] and Invokamet-key-[id number].

## 2017-07-04 NOTE — Telephone Encounter (Signed)
I called [email or web address] and the key was resubmitted.  Questions were resubmitted also.

## 2017-07-04 NOTE — Telephone Encounter (Signed)
Plan sent back clinical questions and they were not answered w/in correct time frame. Please renew and resubmit for Trulicity. If you resubmit the clinical questions should populate. They need to be answered and submitted w/in 3 days or the request will expire again.  Fleet Contrasachel with [employer] Phone: [phone] Ref Key [facility] [id number]

## 2017-07-29 ENCOUNTER — Encounter: Payer: Self-pay | Admitting: Internal Medicine

## 2017-07-29 ENCOUNTER — Ambulatory Visit (INDEPENDENT_AMBULATORY_CARE_PROVIDER_SITE_OTHER): Payer: 59 | Admitting: Internal Medicine

## 2017-07-29 VITALS — BP 122/64 | HR 98 | Temp 98.2°F | Resp 16 | Wt 170.4 lb

## 2017-07-29 DIAGNOSIS — E119 Type 2 diabetes mellitus without complications: Secondary | ICD-10-CM | POA: Diagnosis not present

## 2017-07-29 DIAGNOSIS — I10 Essential (primary) hypertension: Secondary | ICD-10-CM

## 2017-07-29 DIAGNOSIS — Z0000 Encounter for general adult medical examination without abnormal findings: Secondary | ICD-10-CM

## 2017-07-29 LAB — LIPID PANEL
Cholesterol: 121 mg/dL (ref 0–200)
HDL: 50 mg/dL (ref >39.00)
LDL Cholesterol: 60 mg/dL (ref 0–99)
NonHDL: 71.05
Total CHOL/HDL Ratio: 2
Triglycerides: 56 mg/dL (ref 0.0–149.0)
VLDL: 11.2 mg/dL (ref 0.0–40.0)

## 2017-07-29 LAB — COMPREHENSIVE METABOLIC PANEL
ALT: 10 U/L (ref 0–53)
AST: 9 U/L (ref 0–37)
Albumin: 4.3 g/dL (ref 3.5–5.2)
Alkaline Phosphatase: 47 U/L (ref 39–117)
BUN: 23 mg/dL (ref 6–23)
CO2: 28 mEq/L (ref 19–32)
Calcium: 9.4 mg/dL (ref 8.4–10.5)
Chloride: 101 mEq/L (ref 96–112)
Creatinine, Ser: 0.89 mg/dL (ref 0.40–1.50)
GFR: 92.03 mL/min (ref >60.00)
Glucose, Bld: 247 mg/dL — ABNORMAL HIGH (ref 70–99)
Potassium: 4.7 mEq/L (ref 3.5–5.1)
Sodium: 137 mEq/L (ref 135–145)
Total Bilirubin: 0.7 mg/dL (ref 0.2–1.2)
Total Protein: 6.7 g/dL (ref 6.0–8.3)

## 2017-07-29 LAB — CBC WITH DIFFERENTIAL/PLATELET
Basophils Absolute: 0 10*3/uL (ref 0.0–0.1)
Basophils Relative: 0.5 % (ref 0.0–3.0)
Eosinophils Absolute: 0.4 10*3/uL (ref 0.0–0.7)
Eosinophils Relative: 5.8 % — ABNORMAL HIGH (ref 0.0–5.0)
HCT: 44.8 % (ref 39.0–52.0)
Hemoglobin: 15.4 g/dL (ref 13.0–17.0)
Lymphocytes Relative: 16 % (ref 12.0–46.0)
Lymphs Abs: 1 10*3/uL (ref 0.7–4.0)
MCHC: 34.4 g/dL (ref 30.0–36.0)
MCV: 91.3 fl (ref 78.0–100.0)
Monocytes Absolute: 0.5 10*3/uL (ref 0.1–1.0)
Monocytes Relative: 7.5 % (ref 3.0–12.0)
Neutro Abs: 4.5 10*3/uL (ref 1.4–7.7)
Neutrophils Relative %: 70.2 % (ref 43.0–77.0)
Platelets: 255 10*3/uL (ref 150.0–400.0)
RBC: 4.91 Mil/uL (ref 4.22–5.81)
RDW: 13.6 % (ref 11.5–15.5)
WBC: 6.4 10*3/uL (ref 4.0–10.5)

## 2017-07-29 LAB — MICROALBUMIN / CREATININE URINE RATIO
Creatinine,U: 66.5 mg/dL
Microalb Creat Ratio: 2.9 mg/g (ref 0.0–30.0)
Microalb, Ur: 1.9 mg/dL (ref 0.0–1.9)

## 2017-07-29 LAB — TSH: TSH: 1.51 u[IU]/mL (ref 0.35–4.50)

## 2017-07-29 LAB — POCT GLYCOSYLATED HEMOGLOBIN (HGB A1C): Hemoglobin A1C: 6.1

## 2017-07-29 MED ORDER — SILDENAFIL CITRATE 100 MG PO TABS: 100.0000 mg | ORAL_TABLET | Freq: Every day | ORAL | 6 refills | Status: DC | PRN

## 2017-07-29 MED ORDER — DULAGLUTIDE 1.5 MG/0.5ML ~~LOC~~ SOAJ: 1.5000 mg | SUBCUTANEOUS | 6 refills | Status: DC

## 2017-07-29 MED ORDER — ATORVASTATIN CALCIUM 20 MG PO TABS: 20.0000 mg | ORAL_TABLET | Freq: Every day | ORAL | 3 refills | Status: DC

## 2017-07-29 MED ORDER — BENAZEPRIL HCL 20 MG PO TABS: 20.0000 mg | ORAL_TABLET | Freq: Every day | ORAL | 1 refills | Status: DC

## 2017-07-29 MED ORDER — CANAGLIFLOZIN-METFORMIN HCL 150-1000 MG PO TABS: ORAL_TABLET | ORAL | 1 refills | Status: DC

## 2017-07-29 NOTE — Progress Notes (Signed)
Subjective:    Patient ID: Justin [name], male    DOB: 11/29/1955, 62 y.o.   MRN: [id number]  HPI  Lab Results  Component Value Date   HGBA1C 6.1 07/29/2017   62 year old patient who is seen today for follow-up of type 2 diabetes as well as essential hypertension. He remains stable. No cardiopulmonary complaints Enjoying retirement  Hemoglobin A1c today 6.1;  blood pressure remains well controlled  Past Medical History:  Diagnosis Date  . Allergy   . Diabetes mellitus   . ED (erectile dysfunction)   . History of colonic polyps   . Hypertension      Social History   Socioeconomic History  . Marital status: Married    Spouse name: Not on file  . Number of children: Not on file  . Years of education: Not on file  . Highest education level: Not on file  Occupational History  . Not on file  Social Needs  . Financial resource strain: Not on file  . Food insecurity:    Worry: Not on file    Inability: Not on file  . Transportation needs:    Medical: Not on file    Non-medical: Not on file  Tobacco Use  . Smoking status: Never Smoker  . Smokeless tobacco: Never Used  Substance and Sexual Activity  . Alcohol use: Yes    Comment: socially  . Drug use: No  . Sexual activity: Not on file  Lifestyle  . Physical activity:    Days per week: Not on file    Minutes per session: Not on file  . Stress: Not on file  Relationships  . Social connections:    Talks on phone: Not on file    Gets together: Not on file    Attends religious service: Not on file    Active member of club or organization: Not on file    Attends meetings of clubs or organizations: Not on file    Relationship status: Not on file  . Intimate partner violence:    Fear of current or ex partner: Not on file    Emotionally abused: Not on file    Physically abused: Not on file    Forced sexual activity: Not on file  Other Topics Concern  . Not on file  Social History Narrative  . Not on file      Past Surgical History:  Procedure Laterality Date  . no prior surgery      Family History  Problem Relation Age of Onset  . Hypertension Mother   . Stroke Mother   . Dementia Father   . Hypertension Sister   . Diabetes Brother   . Hypertension Brother   . Cerebral palsy Daughter   . Cerebral palsy Daughter   . Colon cancer Neg Hx     No Known Allergies  Current Outpatient Medications on File Prior to Visit  Medication Sig Dispense Refill  . atorvastatin (LIPITOR) 20 MG tablet Take 1 tablet (20 mg total) by mouth daily. 90 tablet 3  . benazepril (LOTENSIN) 20 MG tablet Take 1 tablet (20 mg total) by mouth daily. 90 tablet 1  . Canagliflozin-Metformin HCl (INVOKAMET) [phone] MG TABS TAKE 1 TABLET BY MOUTH 2 (TWO) TIMES DAILY. 180 tablet 1  . cetirizine (ZYRTEC ALLERGY) 10 MG tablet Take 10 mg by mouth daily.    . Dulaglutide (TRULICITY) 1.5 MG/0.5ML SOPN Inject 1.5 mg into the skin once a week. 4 pen 6  . glucose  blood (ONETOUCH VERIO) test strip Use as instructed 100 each 12  . Multiple Vitamin (MULTIVITAMIN) capsule Take 1 capsule by mouth daily.    [name] 1 each by Does not apply route 2 (two) times daily. 100 each 3  . sildenafil (VIAGRA) 100 MG tablet Take 1 tablet (100 mg total) by mouth daily as needed. 10 tablet 6  . amLODipine (NORVASC) 5 MG tablet TAKE 1 TABLET DAILY (Patient not taking: Reported on 07/29/2017) 90 tablet 1   No current facility-administered medications on file prior to visit.     BP 122/64 (BP Location: Left Arm, Patient Position: Sitting, Cuff Size: Normal)   Pulse 98   Temp 98.2 F (36.8 C) (Oral)   Resp 16   Wt 170 lb 6.4 oz (77.3 kg)   SpO2 98%   BMI 23.43 kg/m      Review of Systems  Constitutional: Negative for appetite change, chills, fatigue and fever.  HENT: Negative for congestion, dental problem, ear pain, hearing loss, sore throat, tinnitus, trouble swallowing and voice change.   Eyes: Negative  for pain, discharge and visual disturbance.  Respiratory: Negative for cough, chest tightness, wheezing and stridor.   Cardiovascular: Negative for chest pain, palpitations and leg swelling.  Gastrointestinal: Negative for abdominal distention, abdominal pain, blood in stool, constipation, diarrhea, nausea and vomiting.  Genitourinary: Negative for difficulty urinating, discharge, flank pain, genital sores, hematuria and urgency.  Musculoskeletal: Negative for arthralgias, back pain, gait problem, joint swelling, myalgias and neck stiffness.  Skin: Negative for rash.  Neurological: Negative for dizziness, syncope, speech difficulty, weakness, numbness and headaches.  Hematological: Negative for adenopathy. Does not bruise/bleed easily.  Psychiatric/Behavioral: Negative for behavioral problems and dysphoric mood. The patient is not nervous/anxious.        Objective:   Physical Exam  Constitutional: He is oriented to person, place, and time. He appears well-developed.  HENT:  Head: Normocephalic.  Right Ear: External ear normal.  Left Ear: External ear normal.  Eyes: Conjunctivae and EOM are normal.  Neck: Normal range of motion.  Cardiovascular: Normal rate, normal heart sounds and intact distal pulses.  Pedal pulses full  Pulmonary/Chest: Breath sounds normal.  Abdominal: Bowel sounds are normal.  Musculoskeletal: Normal range of motion. He exhibits no edema or tenderness.  Neurological: He is alert and oriented to person, place, and time.  Psychiatric: He has a normal mood and affect. His behavior is normal.          Assessment & Plan:  Diabetes mellitus.  Well controlled.  No change in therapy.  Continue annual eye examination Essential hypertension stable.  Amlodipine has been discontinued pressure remains stable  All medications refilled Continue statin therapy  laboratory update today   CPX 6 months  Johniece Hornbaker Justin [name]

## 2017-07-29 NOTE — Patient Instructions (Signed)
Limit your sodium (Salt) intake   Please check your hemoglobin A1c every 3-6 months  Please see your eye doctor yearly to check for diabetic eye damage  Return in 6 months for follow-up     

## 2017-07-30 LAB — HEPATITIS C ANTIBODY
Hepatitis C Ab: NONREACTIVE
SIGNAL TO CUT-OFF: 0.01 (ref <1.00)

## 2017-08-17 ENCOUNTER — Other Ambulatory Visit: Payer: Self-pay | Admitting: Internal Medicine

## 2017-11-24 ENCOUNTER — Telehealth: Payer: Self-pay | Admitting: Family Medicine

## 2017-11-24 NOTE — Telephone Encounter (Signed)
Copied from CRM [phone]. Topic: General - Other >> Nov 24, 2017 10:21 AM Gerrianne ScalePayne, Angela L wrote: Reason for CRM: Tresa EndoKelly from [employer] [phone] is calling for prior authorization on Dulaglutide (TRULICITY) 1.5 MG/0.5ML SOPN  (P) [id number] reference # [id number]

## 2017-11-24 NOTE — Telephone Encounter (Signed)
I called [employer] and spoke with Caley.  She stated the prior auth expired and this was resubmitted today under the same key-[id number].

## 2017-11-26 ENCOUNTER — Telehealth: Payer: Self-pay | Admitting: Internal Medicine

## 2017-11-26 NOTE — Telephone Encounter (Signed)
Copied from CRM [phone]. Topic: General - Other >> Nov 24, 2017 12:02 PM Mcneil, Ja-Kwan wrote: Reason for CRM: Pt states [facility] him that both the Rx for Canagliflozin-metFORMIN HCl (INVOKAMET) [phone] MG TABS and Dulaglutide (TRULICITY) 1.5 MG/0.5ML SOPN were denied due to lack of information. Pt requests call back. Cb# [phone]

## 2017-11-27 NOTE — Telephone Encounter (Signed)
Fax received from [facility] stating the request was approved through 11/25/2018.  I called the pt and left a detailed message with this information.

## 2017-11-27 NOTE — Telephone Encounter (Signed)
Fax received from [employer] stating the request was approved through 11/25/2018.  I called the pt and left a detailed message with this information. 

## 2017-12-01 NOTE — Telephone Encounter (Signed)
Patient calling and states that he has not received the approval for Canagliflozin-metFORMIN HCl (INVOKAMET) [phone] MG TABS. States that [facility] is still needing the patient's diagnosis, all medications he has tried for the diagnosis with the dosage, and if he had stopped taking any medication, why? Please advise.

## 2017-12-02 NOTE — Telephone Encounter (Signed)
Prior auth for [employer]-SCInvokamet resubmitted to [email or web address]-key [id number].

## 2017-12-02 NOTE — Telephone Encounter (Signed)
I called [facility] PA dept and spoke with [facility] PaJeanette.  She reviewed the pts profile and stated the Rx was approved today and should go through.  I called the pt and informed him of this,  advised he contact the pharmacy help desk if he has questions and he agreed.

## 2017-12-25 DIAGNOSIS — E119 Type 2 diabetes mellitus without complications: Secondary | ICD-10-CM | POA: Diagnosis not present

## 2017-12-25 LAB — HM DIABETES EYE EXAM

## 2018-01-03 ENCOUNTER — Other Ambulatory Visit: Payer: Self-pay | Admitting: Internal Medicine

## 2018-01-25 ENCOUNTER — Other Ambulatory Visit: Payer: Self-pay | Admitting: Internal Medicine

## 2018-02-02 ENCOUNTER — Encounter: Payer: Self-pay | Admitting: Family Medicine

## 2018-02-02 ENCOUNTER — Ambulatory Visit (INDEPENDENT_AMBULATORY_CARE_PROVIDER_SITE_OTHER): Payer: 59 | Admitting: Family Medicine

## 2018-02-02 VITALS — BP 120/68 | HR 88 | Temp 98.1°F | Ht 71.5 in | Wt 169.2 lb

## 2018-02-02 DIAGNOSIS — Z8042 Family history of malignant neoplasm of prostate: Secondary | ICD-10-CM | POA: Insufficient documentation

## 2018-02-02 DIAGNOSIS — Z23 Encounter for immunization: Secondary | ICD-10-CM | POA: Diagnosis not present

## 2018-02-02 DIAGNOSIS — E1159 Type 2 diabetes mellitus with other circulatory complications: Secondary | ICD-10-CM

## 2018-02-02 DIAGNOSIS — E119 Type 2 diabetes mellitus without complications: Secondary | ICD-10-CM

## 2018-02-02 DIAGNOSIS — J301 Allergic rhinitis due to pollen: Secondary | ICD-10-CM

## 2018-02-02 DIAGNOSIS — I10 Essential (primary) hypertension: Secondary | ICD-10-CM

## 2018-02-02 DIAGNOSIS — E1169 Type 2 diabetes mellitus with other specified complication: Secondary | ICD-10-CM | POA: Diagnosis not present

## 2018-02-02 DIAGNOSIS — N529 Male erectile dysfunction, unspecified: Secondary | ICD-10-CM | POA: Insufficient documentation

## 2018-02-02 DIAGNOSIS — N5201 Erectile dysfunction due to arterial insufficiency: Secondary | ICD-10-CM

## 2018-02-02 DIAGNOSIS — E785 Hyperlipidemia, unspecified: Secondary | ICD-10-CM | POA: Insufficient documentation

## 2018-02-02 DIAGNOSIS — I152 Hypertension secondary to endocrine disorders: Secondary | ICD-10-CM

## 2018-02-02 LAB — POCT GLYCOSYLATED HEMOGLOBIN (HGB A1C): Hemoglobin A1C: 6.1 % — AB (ref 4.0–5.6)

## 2018-02-02 MED ORDER — DULAGLUTIDE 1.5 MG/0.5ML ~~LOC~~ SOAJ: 1.5000 mg | SUBCUTANEOUS | 1 refills | Status: DC

## 2018-02-02 NOTE — Assessment & Plan Note (Signed)
S: controlled on  benazepril 20mg - added benefit of it being renal protective for diabetes BP Readings from Last 3 Encounters:  02/02/18 120/68  07/29/17 122/64  01/27/17 (!) 104/54  A/P: We discussed blood pressure goal of <140/90. Continue current meds

## 2018-02-02 NOTE — Assessment & Plan Note (Signed)
S:  controlled on atorvastatin 20mg  daily A/P: last LDL under goal of 70 at 60 in April-  continue current meds

## 2018-02-02 NOTE — Assessment & Plan Note (Signed)
S:  controlled on invokamet 150-1000mg  BID, trulicity 1.5 mg weekly. Some constipation trulicity first few days.  Lab Results  Component Value Date   HGBA1C 6.1 07/29/2017   HGBA1C 5.9 01/27/2017   HGBA1C 6.2 01/27/2017   A/P: 6.1 again today- excellent control- continue current medications

## 2018-02-02 NOTE — Patient Instructions (Addendum)
Health Maintenance Due  Topic Date Due  . OPHTHALMOLOGY EXAM -called to have report faxed 12/13/2017  . HEMOGLOBIN A1C -completed today 01/28/2018   Lab Results  Component Value Date   HGBA1C 6.1 (A) 02/02/2018   Great to meet you today! Looking forward to working with you- will see you at your physical in 6 months or sooner if you need [place]   Home exercises as discussed for shoulder. If not improving or worsens let me refer you to Dr. Berline Chough of sports medicine in our building

## 2018-02-02 NOTE — Progress Notes (Signed)
Phone: [phone]  Subjective:  Patient presents today to establish care with me as their new primary care provider. Patient was formerly a patient of Dr. Amador Cunas. Chief complaint-noted.   See problem oriented charting ROS- right and left shoulder pain but improving. No chest pain or shortness of breath. No edema.   The following were reviewed and entered/updated in epic: Past Medical History:  Diagnosis Date  . Allergy   . Diabetes mellitus   . ED (erectile dysfunction)    sildenafil 100 mg sparingly  . History of colonic polyps   . Hypertension   . PROSTATITIS, ACUTE 12/02/2009   Qualifier: Diagnosis of  By: Alphonsus Sias MD, Ronnette Hila    Patient Active Problem List   Diagnosis Date Noted  . Diabetes mellitus without complication (HCC) 12/23/2006    Priority: High  . Hyperlipidemia associated with type 2 diabetes mellitus (HCC) 02/02/2018    Priority: Medium  . Family history of prostate cancer 02/02/2018    Priority: Medium  . Hypertension associated with diabetes (HCC) 12/23/2006    Priority: Medium  . ED (erectile dysfunction)     Priority: Low  . History of colonic polyps 01/18/2008    Priority: Low  . Allergic rhinitis 11/07/2006    Priority: Low   Past Surgical History:  Procedure Laterality Date  . no prior surgery      Family History  Problem Relation Age of Onset  . Dementia Father   . CAD Father        mid 28s, never smoker  . Hypertension Mother   . Stroke Mother        61, first stroke 45. never smoker  . Diabetes Mother        in late life  . Hypertension Sister   . Diabetes Brother   . Hypertension Brother   . Prostate cancer Brother        27  . Cerebral palsy Daughter   . Cerebral palsy Daughter   . Pneumonia Maternal Grandfather        died from this  . Diabetes Paternal Grandfather   . Colon cancer Neg Hx     Medications- reviewed and updated Current Outpatient Medications  Medication Sig Dispense Refill  . atorvastatin  (LIPITOR) 20 MG tablet Take 1 tablet (20 mg total) by mouth daily. 90 tablet 3  . benazepril (LOTENSIN) 20 MG tablet TAKE 1 TABLET BY MOUTH  DAILY 90 tablet 1  . cetirizine (ZYRTEC ALLERGY) 10 MG tablet Take 10 mg by mouth daily.    . Dulaglutide (TRULICITY) 1.5 MG/0.5ML SOPN Inject 1.5 mg into the skin once a week. 4 pen 6  . glucose blood (ONETOUCH VERIO) test strip Use as instructed 100 each 12  . INVOKAMET [phone] MG TABS TAKE 1 TABLET BY MOUTH TWO  TIMES DAILY 180 tablet 1  . Multiple Vitamin (MULTIVITAMIN) capsule Take 1 capsule by mouth daily.    [name] 1 each by Does not apply route 2 (two) times daily. 100 each 3  . sildenafil (VIAGRA) 100 MG tablet Take 1 tablet (100 mg total) by mouth daily as needed. 10 tablet 6   Allergies-reviewed and updated No Known Allergies  Social History   Social History Narrative   Married. Identical twin daughters 25 in 2019- mirror twins- born 3 weeks. Both daughters have CP and are wheelchair bound - both live in [place] - graduated top of their classes and have MBA from [employer]. 1 daughters married-  they all live together.       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       Hobbies: travel, walks and hikes, skiing        Objective: BP 120/68 (BP Location: Left Arm, Patient Position: Sitting, Cuff Size: Large)   Pulse 88   Temp 98.1 F (36.7 C) (Oral)   Ht 5' 11.5" (1.816 m)   Wt 169 lb 3.2 oz (76.7 kg)   SpO2 97%   BMI 23.27 kg/m  Gen: NAD, resting comfortably HEENT: Mucous membranes are moist. Oropharynx normal Neck: no thyromegaly CV: RRR no murmurs rubs or gallops Lungs: CTAB no crackles, wheeze, rhonchi Abdomen: soft/nontender/nondistended/normal bowel sounds. No rebound or guarding.  Ext: no edema Skin: warm, dry Neuro: grossly normal, moves all extremities, PERRLA  Bilateral Shoulder: Inspection reveals no abnormalities, atrophy or asymmetry. Palpation is normal with no  tenderness over AC joint or bicipital groove. ROM is full in all planes. Rotator cuff strength normal throughout. Signs of impingement with positive Neer and Hawkin's tests, empty can. Pain with putting hand at low back. Right shoulder worse for pain with each test  Assessment/Plan:  Bilateral shoulder pain S: both shoulder injury years ago from lifting daughters over the years. Improving some now. Having pain in both right and left shoulder- more on right side.  A/P: signs of rotator cuff injury right >L. Gave option of watching, home exercise, PT, sports medicine- he wants to try home exercises from sports med handout and if not improving will consider PT or sports medicine visit   Diabetes mellitus without complication (HCC) S:  controlled on invokamet 150-1000mg  BID, trulicity 1.5 mg weekly. Some constipation trulicity first few days.  Lab Results  Component Value Date   HGBA1C 6.1 07/29/2017   HGBA1C 5.9 01/27/2017   HGBA1C 6.2 01/27/2017   A/P: 6.1 again today- excellent control- continue current medications  Hypertension associated with diabetes (HCC) S: controlled on  benazepril 20mg - added benefit of it being renal protective for diabetes BP Readings from Last 3 Encounters:  02/02/18 120/68  07/29/17 122/64  01/27/17 (!) 104/54  A/P: We discussed blood pressure goal of <140/90. Continue current meds  Hyperlipidemia associated with type 2 diabetes mellitus (HCC) S:  controlled on atorvastatin 20mg  daily A/P: last LDL under goal of 70 at 60 in April-  continue current meds  Future Appointments  Date Time Provider Department Center  08/05/2018 10:40 AM Shelva Majestic, MD [facility]   Return in about 6 months (around 08/04/2018) for physical.  Lab/Order associations: Diabetes mellitus without complication (HCC) - Plan: POCT glycosylated hemoglobin (Hb A1C)  Hyperlipidemia associated with type 2 diabetes mellitus (HCC)  Hypertension associated with diabetes  (HCC)  Seasonal allergic rhinitis due to pollen  Erectile dysfunction due to arterial insufficiency  Need for prophylactic vaccination and inoculation against influenza - Plan: Flu Vaccine QUAD 36+ mos IM  Need for prophylactic vaccination against Streptococcus pneumoniae (pneumococcus) - Plan: Pneumococcal polysaccharide vaccine 23-valent greater than or equal to 2yo subcutaneous/IM  Meds ordered this encounter  Medications  . Dulaglutide (TRULICITY) 1.5 MG/0.5ML SOPN    Sig: Inject 1.5 mg into the skin once a week.    Dispense:  13 pen    Refill:  1  looks like has enough refills otherwise until next visit   Return precautions advised.  Tana Conch, MD

## 2018-02-10 ENCOUNTER — Encounter: Payer: Self-pay | Admitting: Family Medicine

## 2018-06-11 ENCOUNTER — Other Ambulatory Visit: Payer: Self-pay | Admitting: Internal Medicine

## 2018-06-11 ENCOUNTER — Other Ambulatory Visit: Payer: Self-pay | Admitting: Family Medicine

## 2018-06-11 MED ORDER — DULAGLUTIDE 1.5 MG/0.5ML ~~LOC~~ SOAJ: 1.5000 mg | SUBCUTANEOUS | 0 refills | Status: DC

## 2018-06-11 MED ORDER — CANAGLIFLOZIN-METFORMIN HCL 150-1000 MG PO TABS: 1.0000 | ORAL_TABLET | Freq: Two times a day (BID) | ORAL | 0 refills | Status: DC

## 2018-06-11 NOTE — Telephone Encounter (Signed)
Requested medication (s) are due for refill today: yes  Requested medication (s) are on the active medication list: yes  Last refill:  01/05/18  #90  3 refills  Future visit scheduled: yes  Notes to clinic:  Historical provider    Requested Prescriptions  Pending Prescriptions Disp Refills   benazepril (LOTENSIN) 20 MG tablet 90 tablet 1    Sig: Take 1 tablet (20 mg total) by mouth daily.     Cardiovascular:  ACE Inhibitors Failed - 06/11/2018  3:48 PM      Failed - Cr in normal range and within 180 days    Creatinine, Ser  Date Value Ref Range Status  07/29/2017 0.89 0.40 - 1.50 mg/dL Final         Failed - K in normal range and within 180 days    Potassium  Date Value Ref Range Status  07/29/2017 4.7 3.5 - 5.1 mEq/L Final         Passed - Patient is not pregnant      Passed - Last BP in normal range    BP Readings from Last 1 Encounters:  02/02/18 120/68         Passed - Valid encounter within last 6 months    Recent Outpatient Visits          4 months ago Diabetes mellitus without complication ([place])   [facility], Brayton Mars, MD   10 months ago Diabetes mellitus without complication ([place])   [place] [facility], Doretha Sou, MD   1 year ago Essential hypertension   [facility], Doretha Sou, MD   1 year ago Encounter for preventive health examination   [facility], Doretha Sou, MD   2 years ago Essential hypertension   [facility], Doretha Sou, MD      Future Appointments            In 1 month Yong Channel, Brayton Mars, MD [facility]         Signed Prescriptions Disp Refills   Dulaglutide (TRULICITY) 1.5 QQ/7.6PP SOPN 13 pen 0    Sig: Inject 1.5 mg into the skin once a week.     Endocrinology:  Diabetes - GLP-1 Receptor Agonists Passed - 06/11/2018  3:48 PM      Passed - HBA1C is between 0 and  7.9 and within 180 days    Hemoglobin A1C  Date Value Ref Range Status  02/02/2018 6.1 (A) 4.0 - 5.6 % Final   Hgb A1c MFr Bld  Date Value Ref Range Status  01/27/2017 6.2 4.6 - 6.5 % Final    Comment:    Glycemic Control Guidelines for People with Diabetes:Non Diabetic:  <6%Goal of Therapy: <7%Additional Action Suggested:  >8%          Passed - Valid encounter within last 6 months    Recent Outpatient Visits          4 months ago Diabetes mellitus without complication ([place])   [place], [place]   10 months ago Diabetes mellitus without complication ([place])   [place] [facility], Doretha Sou, MD   1 year ago Essential hypertension   Therapist[facility] at [place], Doretha Sou, MD   1 year ago Encounter for preventive health examination   [place] [facility], Doretha Sou, MD   2 years ago Essential hypertension  Therapist, [facility] at [facility], Doretha Sou, MD      Future Appointments            In 1 month Yong Channel, Brayton Mars, MD [facility]          Canagliflozin-metFORMIN HCl (INVOKAMET) [phone] MG TABS 180 tablet 0    Sig: Take 1 tablet by mouth 2 (two) times daily.     Endocrinology:  Diabetes - Biguanide + SGLT2 Inhibitor Combos Passed - 06/11/2018  3:48 PM      Passed - Cr in normal range and within 360 days    Creatinine, Ser  Date Value Ref Range Status  07/29/2017 0.89 0.40 - 1.50 mg/dL Final         Passed - LDL in normal range and within 360 days    LDL Cholesterol  Date Value Ref Range Status  07/29/2017 60 0 - 99 mg/dL Final         Passed - HBA1C is between 0 and 7.9 and within 180 days    Hemoglobin A1C  Date Value Ref Range Status  02/02/2018 6.1 (A) 4.0 - 5.6 % Final   Hgb A1c MFr Bld  Date Value Ref Range Status  01/27/2017 6.2 4.6 - 6.5 % Final    Comment:    Glycemic Control Guidelines for People with Diabetes:Non  Diabetic:  <6%Goal of Therapy: <7%Additional Action Suggested:  >8%          Passed - eGFR in normal range and within 360 days    GFR calc Af Amer  Date Value Ref Range Status  08/17/2007 101 mL/min Final   GFR calc non Af Amer  Date Value Ref Range Status  02/20/2010 93.24 >60 mL/min Final   GFR  Date Value Ref Range Status  07/29/2017 92.03 >60.00 mL/min Final         Passed - Valid encounter within last 6 months    Recent Outpatient Visits          4 months ago Diabetes mellitus without complication ([place])   [facility], Brayton Mars, MD   10 months ago Diabetes mellitus without complication ([place])   [place] [facility], Doretha Sou, MD   1 year ago Essential hypertension   Therapist[facility] at [facility], Doretha Sou, MD   1 year ago Encounter for preventive health examination   Therapist[facility] at [facility], Doretha Sou, MD   2 years ago Essential hypertension   Therapist[facility] at [facility], Doretha Sou, MD      Future Appointments            In 1 month Yong Channel, Brayton Mars, MD [place], [facility]

## 2018-06-11 NOTE — Telephone Encounter (Signed)
Copied from CRM [id number]. Topic: Quick Communication - Rx Refill/Question >> Jun 11, 2018  3:41 PM Jens Som A wrote: Medication: benazepril (LOTENSIN) 20 MG tablet [[id number]] , INVOKAMET [phone] MG TABS [[id number]] , Dulaglutide (TRULICITY) 1.5 MG/0.5ML SOPN [[id number]]  Has the patient contacted their pharmacy? Yes  (Agent: If no, request that the patient contact the pharmacy for the refill.) (Agent: If yes, when and what did the pharmacy advise?)  Preferred Pharmacy (with phone number or street name): [id number] - [place], [place] - [address] [phone] (Phone) [phone] (Fax)    Agent: Please be advised that RX refills may take up to 3 business days. We ask that you follow-up with your pharmacy.

## 2018-06-12 ENCOUNTER — Other Ambulatory Visit: Payer: Self-pay | Admitting: Internal Medicine

## 2018-06-12 MED ORDER — BENAZEPRIL HCL 20 MG PO TABS: 20.0000 mg | ORAL_TABLET | Freq: Every day | ORAL | 1 refills | Status: DC

## 2018-06-12 NOTE — Telephone Encounter (Signed)
See note

## 2018-06-15 ENCOUNTER — Ambulatory Visit (INDEPENDENT_AMBULATORY_CARE_PROVIDER_SITE_OTHER): Payer: BC Managed Care – PPO

## 2018-06-15 ENCOUNTER — Telehealth: Payer: Self-pay | Admitting: Family Medicine

## 2018-06-15 ENCOUNTER — Ambulatory Visit: Payer: BC Managed Care – PPO | Admitting: Family Medicine

## 2018-06-15 ENCOUNTER — Encounter: Payer: Self-pay | Admitting: Family Medicine

## 2018-06-15 VITALS — BP 138/80 | HR 124 | Temp 99.6°F | Ht 71.5 in | Wt 163.6 lb

## 2018-06-15 DIAGNOSIS — J101 Influenza due to other identified influenza virus with other respiratory manifestations: Secondary | ICD-10-CM | POA: Diagnosis not present

## 2018-06-15 DIAGNOSIS — R000 Tachycardia, unspecified: Secondary | ICD-10-CM | POA: Diagnosis not present

## 2018-06-15 DIAGNOSIS — R6889 Other general symptoms and signs: Secondary | ICD-10-CM

## 2018-06-15 LAB — POC INFLUENZA A&B (BINAX/QUICKVUE)
Influenza A, POC: NEGATIVE
Influenza B, POC: POSITIVE — AB

## 2018-06-15 MED ORDER — FLUTICASONE PROPIONATE 50 MCG/ACT NA SUSP: 2.0000 | Freq: Every day | NASAL | 6 refills | Status: DC

## 2018-06-15 MED ORDER — OSELTAMIVIR PHOSPHATE 75 MG PO CAPS: 75.0000 mg | ORAL_CAPSULE | Freq: Two times a day (BID) | ORAL | 0 refills | Status: DC

## 2018-06-15 NOTE — Telephone Encounter (Signed)
Please have patient f/u with Dr. Durene Cal in 2-3 weeks to make sure heart rate back to normal and will repeat his EKG.

## 2018-06-15 NOTE — Progress Notes (Signed)
Patient: Justin [name] MRN: [id number] DOB: Mar 20, 1956 PCP: Shelva Majestic, MD     Subjective:  Chief Complaint  Patient presents with  . Cough  . Nasal Congestion    HPI: The patient is a 63 y.o. male who presents today for cough and congestion. He started to feel bad on Thursday and thought it was allergies at first. He started to take zyrtec on Friday and his runny nose stopped, but his post nasal drip, congestion and cough continued. His cough is mildly productive. No fevers at home. He is a little achy all over. He went [place] airplane. He has not asthma, copd and does not smoke. No sinus pain or pressure. He has had some shortness of breath, but no wheezing. Walking around he gets winded and it's hard to get a deep breath. Besides zyrtec and tylenol, no over the counter medication. NO sick contacts that he is aware of.   Review of Systems  Constitutional: Positive for chills, fatigue and fever.  HENT: Positive for congestion and sore throat. Negative for ear pain, postnasal drip, rhinorrhea, sinus pressure, sinus pain and trouble swallowing.   Eyes: Negative for photophobia and pain.  Respiratory: Positive for cough and shortness of breath. Negative for wheezing.   Cardiovascular: Negative for chest pain.  Gastrointestinal: Positive for diarrhea and vomiting. Negative for abdominal pain.  Musculoskeletal: Positive for myalgias. Negative for arthralgias, back pain, neck pain and neck stiffness.  Neurological: Negative for weakness and headaches.  Hematological: Negative for adenopathy.  Psychiatric/Behavioral: Negative for sleep disturbance.    Allergies Patient has No Known Allergies.  Past Medical History Patient  has a past medical history of Allergy, Diabetes mellitus, ED (erectile dysfunction), History of colonic polyps, Hypertension, and PROSTATITIS, ACUTE (12/02/2009).  Surgical History Patient  has a past surgical history that includes no prior  surgery.  Family History Pateint's family history includes CAD in his father; Cerebral palsy in his daughter and daughter; Dementia in his father; Diabetes in his brother, mother, and paternal grandfather; Hypertension in his brother, mother, and sister; Pneumonia in his maternal grandfather; Prostate cancer in his brother; Stroke in his mother.  Social History Patient  reports that he has never smoked. He has never used smokeless tobacco. He reports current alcohol use. He reports that he does not use drugs.    Objective: Vitals:   06/15/18 1402  BP: 138/80  Pulse: (!) 124  Temp: 99.6 F (37.6 C)  TempSrc: Oral  SpO2: 96%  Weight: 163 lb 9.6 oz (74.2 kg)  Height: 5' 11.5" (1.816 m)    Body mass index is 22.5 kg/m.  Physical Exam Vitals signs reviewed.  Constitutional:      Appearance: Normal appearance. He is ill-appearing.  HENT:     Right Ear: Tympanic membrane, ear canal and external ear normal.     Left Ear: Tympanic membrane, ear canal and external ear normal.     Nose: Congestion and rhinorrhea present.     Mouth/Throat:     Mouth: Mucous membranes are moist.     Pharynx: Posterior oropharyngeal erythema present.  Neck:     Musculoskeletal: Normal range of motion and neck supple.  Cardiovascular:     Rate and Rhythm: Regular rhythm. Tachycardia present.     Heart sounds: Normal heart sounds.  Pulmonary:     Effort: Pulmonary effort is normal. No respiratory distress.     Breath sounds: Normal breath sounds. No wheezing or rales.  Abdominal:  General: Abdomen is flat. Bowel sounds are normal.     Palpations: Abdomen is soft.  Lymphadenopathy:     Cervical: No cervical adenopathy.  Skin:    Capillary Refill: Capillary refill takes less than 2 seconds.  Neurological:     Mental Status: He is alert.    Cxr: no acute findings/consolidation. Official read pending.  Flu B + Ekg: RBB/bifasicular block     Assessment/plan: 1. Influenza B -outside of the 48  hour window, but not by much. Will send in tamiful for him to start this. Conservative therapy and push fluids, ibuprofen prn and rest. Worsening respiratory symptoms: er.  - DG Chest 2 View  2. Flu-like symptoms See above.  - POC Influenza A&B(BINAX/QUICKVUE)  3. Tachycardia No irregular rhythm. He has a RBB and bifasicular block. I have no past ekg to compare this too. tsh has all been normal.  He is essentially asymptomatic. Will have him f/u with DR. Hunter in 2-3 weeks for repeat EKG and see if his heart rate is better. If any chest pain, palpitations, worsening shortness of breath, etc he is to go to ER.  - EKG 12-Lead   Return in about 3 weeks (around 07/06/2018) for tachycardia/repeat ekg with dr. Therapist, nutritional .   Orland Mustard, MD [facility]     06/15/2018

## 2018-06-15 NOTE — Patient Instructions (Signed)

## 2018-06-15 NOTE — Telephone Encounter (Signed)
Called and left detailed message on patient's home answering machine advising him per Dr. Artis Flock to follow up w/Dr. Durene Cal in 2-3 wks from today's visit to repeat ekg and make sure that heart rate has returned to normal.

## 2018-06-22 ENCOUNTER — Other Ambulatory Visit: Payer: Self-pay | Admitting: Family Medicine

## 2018-06-22 NOTE — Telephone Encounter (Signed)
Copied from CRM [id number]. Topic: Quick Communication - Rx Refill/Question >> Jun 22, 2018 10:02 AM Marylen Ponto wrote: Medication: Canagliflozin-metFORMIN HCl (INVOKAMET) [id number] MG TABS  Has the patient contacted their pharmacy? yes  Preferred Pharmacy (with phone number or street name): [id number] [place], [place] - [address] [phone] (Phone)  [phone] (Fax)  Agent: Please be advised that RX refills may take up to 3 business days. We ask that you follow-up with your pharmacy.

## 2018-06-22 NOTE — Telephone Encounter (Signed)
[facility] called and spoke to Justin [name], [facility] about the refill. She checked and saw the patient has a discount card for this medication, she says she will contact the patient.

## 2018-06-25 ENCOUNTER — Other Ambulatory Visit: Payer: Self-pay

## 2018-07-14 ENCOUNTER — Ambulatory Visit (INDEPENDENT_AMBULATORY_CARE_PROVIDER_SITE_OTHER): Payer: BC Managed Care – PPO | Admitting: Family Medicine

## 2018-07-14 ENCOUNTER — Other Ambulatory Visit: Payer: Self-pay

## 2018-07-14 ENCOUNTER — Encounter: Payer: Self-pay | Admitting: Family Medicine

## 2018-07-14 VITALS — BP 124/70 | HR 89 | Temp 98.3°F | Ht 71.5 in | Wt 164.0 lb

## 2018-07-14 DIAGNOSIS — E1169 Type 2 diabetes mellitus with other specified complication: Secondary | ICD-10-CM

## 2018-07-14 DIAGNOSIS — E1159 Type 2 diabetes mellitus with other circulatory complications: Secondary | ICD-10-CM

## 2018-07-14 DIAGNOSIS — E785 Hyperlipidemia, unspecified: Secondary | ICD-10-CM

## 2018-07-14 DIAGNOSIS — E119 Type 2 diabetes mellitus without complications: Secondary | ICD-10-CM

## 2018-07-14 DIAGNOSIS — R000 Tachycardia, unspecified: Secondary | ICD-10-CM

## 2018-07-14 DIAGNOSIS — I4510 Unspecified right bundle-branch block: Secondary | ICD-10-CM | POA: Diagnosis not present

## 2018-07-14 DIAGNOSIS — I10 Essential (primary) hypertension: Secondary | ICD-10-CM

## 2018-07-14 DIAGNOSIS — I152 Hypertension secondary to endocrine disorders: Secondary | ICD-10-CM

## 2018-07-14 LAB — COMPREHENSIVE METABOLIC PANEL
ALT: 11 U/L (ref 0–53)
AST: 11 U/L (ref 0–37)
Albumin: 4.4 g/dL (ref 3.5–5.2)
Alkaline Phosphatase: 48 U/L (ref 39–117)
BUN: 24 mg/dL — ABNORMAL HIGH (ref 6–23)
CO2: 28 mEq/L (ref 19–32)
Calcium: 9.7 mg/dL (ref 8.4–10.5)
Chloride: 104 mEq/L (ref 96–112)
Creatinine, Ser: 0.9 mg/dL (ref 0.40–1.50)
GFR: 85.21 mL/min (ref >60.00)
Glucose, Bld: 109 mg/dL — ABNORMAL HIGH (ref 70–99)
Potassium: 4.7 mEq/L (ref 3.5–5.1)
Sodium: 140 mEq/L (ref 135–145)
Total Bilirubin: 0.6 mg/dL (ref 0.2–1.2)
Total Protein: 6.8 g/dL (ref 6.0–8.3)

## 2018-07-14 LAB — CBC
HCT: 45.1 % (ref 39.0–52.0)
Hemoglobin: 15.4 g/dL (ref 13.0–17.0)
MCHC: 34.1 g/dL (ref 30.0–36.0)
MCV: 92 fl (ref 78.0–100.0)
Platelets: 227 10*3/uL (ref 150.0–400.0)
RBC: 4.91 Mil/uL (ref 4.22–5.81)
RDW: 14.6 % (ref 11.5–15.5)
WBC: 7 10*3/uL (ref 4.0–10.5)

## 2018-07-14 LAB — LDL CHOLESTEROL, DIRECT: Direct LDL: 52 mg/dL

## 2018-07-14 LAB — HEMOGLOBIN A1C: Hgb A1c MFr Bld: 6.6 % — ABNORMAL HIGH (ref 4.6–6.5)

## 2018-07-14 NOTE — Progress Notes (Signed)
Phone [phone]   Subjective:  Justin [name] is a 63 y.o. year old very pleasant male patient who presents for/with See problem oriented charting ROS-no chest pain or shortness of breath.  No edema.  No fever.  Mild intermittent lingering cough after influenza  Past Medical History-  Patient Active Problem List   Diagnosis Date Noted  . Diabetes mellitus without complication (HCC) 12/23/2006    Priority: High  . Hyperlipidemia associated with type 2 diabetes mellitus (HCC) 02/02/2018    Priority: Medium  . Family history of prostate cancer 02/02/2018    Priority: Medium  . Hypertension associated with diabetes (HCC) 12/23/2006    Priority: Medium  . ED (erectile dysfunction)     Priority: Low  . History of colonic polyps 01/18/2008    Priority: Low  . Allergic rhinitis 11/07/2006    Priority: Low  . RBBB 07/14/2018    Medications- reviewed and updated Current Outpatient Medications  Medication Sig Dispense Refill  . acetaminophen (TYLENOL) 500 MG tablet Take 500 mg by mouth every 6 (six) hours as needed.    Marland Kitchen atorvastatin (LIPITOR) 20 MG tablet Take 1 tablet (20 mg total) by mouth daily. 90 tablet 3  . benazepril (LOTENSIN) 20 MG tablet Take 1 tablet (20 mg total) by mouth daily. 90 tablet 1  . Canagliflozin-metFORMIN HCl (INVOKAMET) [phone] MG TABS Take 1 tablet by mouth 2 (two) times daily. 180 tablet 0  . cetirizine (ZYRTEC ALLERGY) 10 MG tablet Take 10 mg by mouth daily.    . Dulaglutide (TRULICITY) 1.5 MG/0.5ML SOPN Inject 1.5 mg into the skin once a week. 13 pen 0  . glucose blood (ONETOUCH VERIO) test strip Use as instructed 100 each 12  . Multiple Vitamin (MULTIVITAMIN) capsule Take 1 capsule by mouth daily.    Justin [name] DELICA LANCETS FINE MISC 1 each by Does not apply route 2 (two) times daily. 100 each 3  . fluticasone (FLONASE) 50 MCG/ACT nasal spray Place 2 sprays into both nostrils daily. (Patient not taking: Reported on 07/14/2018) 16 g 6  . sildenafil  (VIAGRA) 100 MG tablet Take 1 tablet (100 mg total) by mouth daily as needed. 10 tablet 6   No current facility-administered medications for this visit.      Objective:  BP 124/70 (BP Location: Left Arm, Patient Position: Sitting, Cuff Size: Normal)   Pulse 89   Temp 98.3 F (36.8 C) (Oral)   Ht 5' 11.5" (1.816 m)   Wt 164 lb (74.4 kg)   SpO2 97%   BMI 22.55 kg/m  Gen: NAD, resting comfortably CV: RRR no murmurs rubs or gallops Lungs: CTAB no crackles, wheeze, rhonchi Abdomen: soft/nontender Ext: no edema Skin: warm, dry  EKG: sinus rhythm with rate 88, left axis, prolonged qrs but otherwise normal intervals, no obvious hypertrophy, RBBB noted as well as bifasicular block, no st or t wave chages- other than those produced by bundle branch blocks. Unchanged from 06/15/2018 other than no tachycardia.     Assessment and Plan   Other notes: 1.  Had physical coming up next month-we discussed getting some updated blood work today other than full lipid panel which it is too soon for-patient agrees.  We may push physical 3 months or so.  # Diabetes S: Has been controlled on  invokamet 150-100mg  BID, trulicity 1.5 mg weekly Exercise and diet-able to walk 1-2 miles a day many days a week Lab Results  Component Value Date   HGBA1C 6.1 (A) 02/02/2018  HGBA1C 6.1 07/29/2017   HGBA1C 5.9 01/27/2017   A/P: Hopefully stable-update A1c.  Likely continue current medications.   #hypertension S: controlled on benazepril 20 mg alone BP Readings from Last 3 Encounters:  07/14/18 124/70  06/15/18 138/80  02/02/18 120/68  A/P:  Stable. Continue current medications.    #hyperlipidemia S:  controlled on atorvastatin 20 mg Lab Results  Component Value Date   CHOL 121 07/29/2017   HDL 50.00 07/29/2017   LDLCALC 60 07/29/2017   TRIG 56.0 07/29/2017   CHOLHDL 2 07/29/2017   A/P: Likely stable-update direct LDL today  RBBB and bifascicular block S: Saw Justin [name] when he had  influenza-noted to have tachycardia as well as right bundle branch block and bifascicular block A/P: EKG today is stable other than no tachycardia at this time.  Patient does admit to a fair amount of snoring- some of these changes could be sleep apnea related.  With that being said he is over age 66 with hypertension, hyperlipidemia, diabetes- we discussed possible referral to cardiology for their opinion on stress testing and sleep apnea testing-patient is interested in this after things settle down from covid-19.  Fortunately he is asymptomatic even with briskly walking 2 miles-he will let us know if this changes  Future Appointments  Date Time Provider Department Center  11/24/2018 10:40 AM Justin Majestic, MD [facility]   Lab/Order associations: Tachycardia - Plan: EKG 12-Lead  Diabetes mellitus without complication (HCC) - Plan: Comprehensive metabolic panel, Hemoglobin A1c, CBC, LDL cholesterol, direct  RBBB  Hypertension associated with diabetes (HCC)  Hyperlipidemia associated with type 2 diabetes mellitus (HCC)  Return precautions advised.  Justin Conch, MD

## 2018-07-14 NOTE — Assessment & Plan Note (Addendum)
S: Saw Dr. Artis Flock when Justin [name] had influenza-noted to have tachycardia as well as right bundle branch block and bifascicular block A/P: EKG today is stable other than no tachycardia at this time.  Patient does admit to a fair amount of snoring- some of these changes could be sleep apnea related.  With that being said Justin [name] is over age 63 with hypertension, hyperlipidemia, diabetes- we discussed possible referral to cardiology for their opinion on stress testing and sleep apnea testing-patient is interested in this after things settle down from covid-19.  Fortunately Justin [name] is asymptomatic even with briskly walking 2 miles-Justin [name] will let us know if this changes

## 2018-07-14 NOTE — Patient Instructions (Addendum)
Please stop by lab before you go If you do not have mychart- we will call you about results within 5 business days of [place] receiving them.  If you have mychart- we will send your results within 3 business days of [place] receiving them.  If abnormal or we want to clarify a result, we will call or mychart you to make sure you receive the message.  If you have questions or concerns or don't hear within 5-7 days, please send [place] a message or call us.   No rush on this since you don't have symptoms but we discussed possibly doing a cardiology referral when things are more calm from covid 19 just to get cardiology opinion on sleep apnea and possibility of stress testing (not saying you need this but just to have the conversation with them)

## 2018-07-19 ENCOUNTER — Telehealth: Payer: Self-pay | Admitting: Family Medicine

## 2018-07-20 NOTE — Telephone Encounter (Signed)
Changes Requested    SYNJARDY XR [phone] MG TB24       Changed from: Canagliflozin-metFORMIN HCl (INVOKAMET) [phone] MG TABS       Sig: Please specify directions, refills and quantity   Disp:  Not specified  Refills:  0   Start: 07/19/2018   Class: Normal   Non-formulary   Last ordered: 1 month ago by Marin Olp, MD Last refill: 07/18/2018   Rx #: [id number]   Pharmacy comment: Alternative Requested:REQUIRES PRIOR AUTHORIZATION.   Endocrinology: Diabetes - Biguanide + SGLT2 Inhibitor Combos Passed4/5 1:03 PM  Cr in normal range and within 360 days   LDL in normal range and within 360 days   HBA1C is between 0 and 7.9 and within 180 days   eGFR in normal range and within 360 days   Valid encounter within last 6 months  Protocol Details  This request has changes from the previous prescription.  [facility] at: [id number] [place], [place] - [address]

## 2018-07-20 NOTE — Telephone Encounter (Signed)
Starting prior [employer]

## 2018-07-22 ENCOUNTER — Other Ambulatory Visit: Payer: Self-pay

## 2018-07-22 MED ORDER — EMPAGLIFLOZIN-METFORMIN HCL ER 12.5-1000 MG PO TB24: 1.0000 | ORAL_TABLET | Freq: Two times a day (BID) | ORAL | 5 refills | Status: DC

## 2018-07-22 NOTE — Telephone Encounter (Signed)
Okay to change Rx? If so, Please specify directions, refills and quantity.

## 2018-07-22 NOTE — Telephone Encounter (Signed)
I sent this in for patient. Please inform him of change- tell him if has issues we can try for prior auth of other medication

## 2018-07-23 NOTE — Telephone Encounter (Signed)
Spoke to pt and informed him of update. Pt stated that the pharmacy called and advise pt tht the Rx was ready. Pt was told to call the office back if he has any issues.

## 2018-08-05 ENCOUNTER — Encounter: Payer: 59 | Admitting: Family Medicine

## 2018-11-05 ENCOUNTER — Telehealth: Payer: Self-pay

## 2018-11-05 NOTE — Telephone Encounter (Signed)
PA request for Trulicity 1.5 mg/ 0.5 mL covermymeds key [id number]

## 2018-11-06 ENCOUNTER — Other Ambulatory Visit: Payer: Self-pay | Admitting: Family Medicine

## 2018-11-06 NOTE — Telephone Encounter (Signed)
Your PA has been resolved, no additional PA is required. For further inquiries please contact the number on the back of the member prescription card.

## 2018-11-18 ENCOUNTER — Telehealth: Payer: Self-pay

## 2018-11-18 NOTE — Telephone Encounter (Signed)
PA for Invokamet [phone] mg tabs Key [id number]

## 2018-11-19 NOTE — Telephone Encounter (Signed)
Pt no longer on this medication

## 2018-11-20 ENCOUNTER — Other Ambulatory Visit: Payer: Self-pay | Admitting: Family Medicine

## 2018-11-23 NOTE — Patient Instructions (Addendum)
Health Maintenance Due  Topic Date Due  . INFLUENZA VACCINE We should have flu shots available by September. Please strongly consider getting flu shot this year. If you get your flu shot at a pharmacy- please let us know.  11/14/2018   Please stop by lab before you go If you do not have mychart- we will call you about results within 5 business days of us receiving them.  If you have mychart- we will send your results within 3 business days of us receiving them.  If abnormal or we want to clarify a result, we will call or mychart you to make sure you receive the message.  If you have questions or concerns or don't hear within 5-7 days, please send us a message or call us.        Dupuytren's Contracture Dupuytren's contracture is a condition in which tissue under the skin of the palm becomes thick. This causes one or more of the fingers to curl inward (contract) toward the palm. After a while, the fingers may not be able to straighten out. This condition affects some or all of the fingers and the palm of the hand. This condition may affect one or both hands. Dupuytren's contracture is a long-term (chronic) condition that develops (progresses) slowly over time. There is no cure, but symptoms can be managed and progression can be slowed with treatment. This condition is usually not dangerous or painful, but it can interfere with everyday tasks. What are the causes?  This condition is caused by tissue (fascia) in the palm that gets thicker and tighter. When the fascia thickens, it pulls on the cords of tissue (tendons) that control finger movement. This causes the fingers to contract. The cause of fascia thickening is not known. However, the condition is often passed along from parent to child (inherited). What increases the risk? The following factors may make you more likely to develop this condition:  Being 63 years of age or older.  Being male.  Having a family history of this  condition.  Using tobacco products, including cigarettes, chewing tobacco, and e-cigarettes.  Drinking alcohol excessively.  Having diabetes.  Having a seizure disorder. What are the signs or symptoms? Early symptoms of this condition may include:  Thick, puckered skin on the hand.  One or more lumps (nodules) on the palm. Nodules may be tender when they first appear, but they are generally painless. Later symptoms of this condition may include:  Thick cords of tissue in the palm.  Fingers curled up toward the palm.  Inability to straighten the fingers into their normal position. Though this condition is usually painless, you may have discomfort when holding or grabbing objects. How is this diagnosed? This condition is diagnosed with a physical exam, which may include:  Looking at your hands and feeling your palms. This is to check for thickened fascia and nodules.  Measuring finger motion.  Doing the [place] tabletop test. You may be asked to try to put your hand on a surface, with your palm down and your fingers straight out. How is this treated? There is no cure for this condition, but treatment can relieve discomfort and make symptoms more manageable. Treatment options may include:  Physical therapy. This can strengthen your hand and increase flexibility.  Occupational therapy. This can help you with everyday tasks that may be more difficult because of your condition.  Shots (injections). Substances may be injected into your hand, such as: ? Medicines that help to decrease swelling (  corticosteroids). ? Proteins (collagenase) to weaken thick tissue. After a collagenase injection, your health care provider may stretch your fingers.  Needle aponeurotomy. A needle is pushed through the skin and into the fascia. Moving the needle against the fascia can weaken or break up the thick tissue.  Surgery. This may be needed if your condition causes discomfort or interferes with  everyday activities. Physical therapy is usually needed after surgery. No treatment is guaranteed to cure this condition. Recurrence of symptoms is common. Follow these instructions at home: Hand care  Take these actions to help protect your hand from possible injury: ? Use tools that have padded grips. ? Wear protective gloves while you work with your hands. ? Avoid repetitive hand movements. General instructions  Take over-the-counter and prescription medicines only as told by your health care provider.  Manage any other conditions that you have, such as diabetes.  If physical therapy was prescribed, do exercises as told by your health care provider.  Do not use any products that contain nicotine or tobacco, such as cigarettes, e-cigarettes, and chewing tobacco. If you need help quitting, ask your health care provider.  If you drink alcohol: ? Limit how much you use to:  0-1 drink a day for women.  0-2 drinks a day for men. ? Be aware of how much alcohol is in your drink. In the [place] one drink equals one 12 oz bottle of beer (355 mL), one 5 oz glass of wine (148 mL), or one 1 oz glass of hard liquor (44 mL).  Keep all follow-up visits as told by your health care provider. This is important. Contact a health care provider if:  You develop new symptoms, or your symptoms get worse.  You have pain that gets worse or does not get better with medicine.  You have difficulty or discomfort with everyday tasks.  You develop numbness or tingling. Get help right away if:  You have severe pain.  Your fingers change color or become unusually cold. Summary  Dupuytren's contracture is a condition in which tissue under the skin of the palm becomes thick.  This condition is caused by tissue (fascia) that thickens. When it thickens, it pulls on the cords of tissue (tendons) that control finger movement and makes the fingers to contract.  You are more likely to develop this condition  if you are a man, are over 67 years of age, have a family history of the condition, and drink a lot of alcohol.  This condition can be treated with physical and occupational therapy, injections, and surgery.  Follow instructions about how to care for your hand. Get help right away if you have severe pain or your fingers change color or become cold. This information is not intended to replace advice given to you by your health care provider. Make sure you discuss any questions you have with your health care provider. Document Released: 01/27/2009 Document Revised: 10/21/2017 Document Reviewed: 10/21/2017 Elsevier Patient Education  2020 [employer].

## 2018-11-23 NOTE — Progress Notes (Signed)
Phone: [phone]   Subjective:  Patient presents today for their annual physical. Chief complaint-noted.   See problem oriented charting- ROS- full  review of systems was completed and negative except for: seasonal allergies  The following were reviewed and entered/updated in epic: Past Medical History:  Diagnosis Date  . Allergy   . Diabetes mellitus   . ED (erectile dysfunction)    sildenafil 100 mg sparingly  . History of colonic polyps   . Hypertension   . PROSTATITIS, ACUTE 12/02/2009   Qualifier: Diagnosis of  By: Alphonsus SiasLetvak MD, Ronnette Hilaichard Ira    Patient Active Problem List   Diagnosis Date Noted  . Diabetes mellitus without complication (HCC) 12/23/2006    Priority: High  . Hyperlipidemia associated with type 2 diabetes mellitus (HCC) 02/02/2018    Priority: Medium  . Family history of prostate cancer 02/02/2018    Priority: Medium  . Hypertension associated with diabetes (HCC) 12/23/2006    Priority: Medium  . Dupuytren contracture 11/24/2018    Priority: Low  . ED (erectile dysfunction)     Priority: Low  . History of colonic polyps 01/18/2008    Priority: Low  . Allergic rhinitis 11/07/2006    Priority: Low  . RBBB 07/14/2018   Past Surgical History:  Procedure Laterality Date  . no prior surgery      Family History  Problem Relation Age of Onset  . Dementia Father   . CAD Father        mid 3070s, never smoker  . Hypertension Mother   . Stroke Mother        4284, first stroke 8569. never smoker  . Diabetes Mother        in late life  . Hypertension Sister   . Diabetes Brother   . Hypertension Brother   . Prostate cancer Brother        [id number]  . Cerebral palsy Daughter   . Cerebral palsy Daughter   . Pneumonia Maternal Grandfather        died from this  . Diabetes Paternal Grandfather   . Colon cancer Neg Hx     Medications- reviewed and updated Current Outpatient Medications  Medication Sig Dispense Refill  . acetaminophen (TYLENOL) 500 MG tablet  Take 500 mg by mouth every 6 (six) hours as needed.    Marland Kitchen. atorvastatin (LIPITOR) 20 MG tablet TAKE 1 TABLET (20 MG TOTAL) BY MOUTH DAILY. 30 tablet 8  . benazepril (LOTENSIN) 20 MG tablet Take 1 tablet (20 mg total) by mouth daily. 90 tablet 3  . cetirizine (ZYRTEC ALLERGY) 10 MG tablet Take 10 mg by mouth daily.    . Empagliflozin-metFORMIN HCl ER (SYNJARDY XR) 12.08-998 MG TB24 Take 1 tablet by mouth 2 (two) times daily. 180 tablet 3  . glucose blood (ONETOUCH VERIO) test strip Use as instructed 100 each 12  . Multiple Vitamin (MULTIVITAMIN) capsule Take 1 capsule by mouth daily.    Letta Pate. ONETOUCH DELICA LANCETS FINE MISC 1 each by Does not apply route 2 (two) times daily. 100 each 3  . sildenafil (VIAGRA) 100 MG tablet Take 1 tablet (100 mg total) by mouth daily as needed. 10 tablet 6  . TRULICITY 1.5 MG/0.5ML SOPN INJECT 1.5 MG INTO THE SKIN ONCE A WEEK. 1.5 pen 5   No current facility-administered medications for this visit.     Allergies-reviewed and updated No Known Allergies  Social History   Social History Narrative   Married. Identical twin daughters [id number] in 2019- mirror  twins- born 4028 weeks. Both daughters have CP and are wheelchair bound - [place] - graduated top of their classes and have MBA from [employer]. 1 daughters married- they all live together.       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       Hobbies: travel, walks and hikes, skiing       Objective  Objective:  BP 132/72 (BP Location: Left Arm, Patient Position: Sitting, Cuff Size: Normal)   Pulse 92   Temp 97.9 F (36.6 C) (Oral)   Ht 5' 11.5" (1.816 m)   Wt 166 lb (75.3 kg)   SpO2 97%   BMI 22.83 kg/m  Gen: NAD, resting comfortably HEENT: Mucous membranes are moist. Oropharynx normal Neck: no thyromegaly CV: RRR no murmurs rubs or gallops Lungs: CTAB no crackles, wheeze, rhonchi Abdomen: soft/nontender/nondistended/normal bowel sounds. No rebound or guarding.  Ext: no edema Skin:  warm, dry Neuro: grossly normal, moves all extremities, PERRLA MSK: Dupuytren contracture noted right hand Diabetic Foot Exam - Simple   Simple Foot Form Diabetic Foot exam was performed with the following findings: Yes 11/24/2018 11:00 AM  Visual Inspection No deformities, no ulcerations, no other skin breakdown bilaterally: Yes Sensation Testing Intact to touch and monofilament testing bilaterally: Yes Pulse Check Posterior Tibialis and Dorsalis pulse intact bilaterally: Yes Comments       Assessment and Plan  63 y.o. male presenting for annual physical.  Health Maintenance counseling: 1. Anticipatory guidance: Patient counseled regarding regular dental exams -q6 months, eye exams -yearly,  avoiding smoking and second hand smoke , limiting alcohol to 2 beverages per day - well under.   2. Risk factor reduction:  Advised patient of need for regular exercise and diet rich and fruits and vegetables to reduce risk of heart attack and stroke. Exercise- walking on days that permit weather wise. Diet-reasonably balanced diet.  Wt Readings from Last 3 Encounters:  11/24/18 166 lb (75.3 kg)  07/14/18 164 lb (74.4 kg)  06/15/18 163 lb 9.6 oz (74.2 kg)  3. Immunizations/screenings/ancillary studies-discussed fall flu shot.  Also offered Shingrix-defers due to COVID-19 Immunization History  Administered Date(s) Administered  . Influenza Split 12/26/2010, 01/02/2012  . Influenza Whole 04/15/2005, 01/18/2008, 02/13/2010  . Influenza,inj,Quad PF,6+ Mos 01/04/2013, 02/22/2014, 02/21/2015, 01/29/2016, 01/06/2017, 02/02/2018  . Pneumococcal Polysaccharide-23 02/02/2018  . Tdap 05/04/2012  4. Prostate cancer screening- update psa and as long as psa trend low risk - can defer rectal exam. No nocturia at present . Has been told small prostate in past.  Lab Results  Component Value Date   PSA 0.32 02/21/2015   PSA 0.92 02/15/2014   PSA 0.35 02/20/2010   5. Colon cancer screening - last colonoscopy  2014- Dr. Christella HartiganJacobs confirmed 10-year colonoscopy repeat.   6. Skin cancer screening- no dermatologist. advised regular sunscreen use. Denies worrisome, changing, or new skin lesions. Has pool but uses sunscreen and added protection with umbrella. 7.  Never smoker   Status of chronic or acute concerns  Diabetes - Taking Synjardy XR 12.08-998 mg BID and Trulicity 1.5 mg weekly. Not checking BG at home. Limiting sugar and carb intake. Following diet recommended by diabetic nutritionist long ago- down 50 lbs from peak. Walking outside daily, swims laps when the weather is nice.  Lab Results  Component Value Date   HGBA1C 6.0 (A) 11/24/2018   of note- was poc machine  Hypertension - Taking Benazepril 20 mg daily. Limiting salt intake.  Good control today.  Hyperlipidemia - Taking Atorvastatin 20 mg daily.  Update lipid panel with labs  Erectile Dysfunction - Taking Sildenafil 100 mg prn- mild improvement.  We will trial Cialis over sildenafil  Left handed. 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 Eliberto Ivory- had offered cardiology referral at last visit in March but he declined due to COVID-19 pandemic-offered again today- still no chest pain or shortness of breath with activity- declines referral for now.  Can let us know if changes mind.   Recommended follow up: 6 month follow up   Lab/Order associations: Not fasting today   ICD-10-CM   1. Preventative health care  Z00.00 CBC    Comprehensive metabolic panel    Lipid panel    PSA    Hemoglobin A1c  2. Diabetes mellitus without complication (HCC)  K09.3 POCT glycosylated hemoglobin (Hb A1C)    CBC    Comprehensive metabolic panel    Lipid panel    Hemoglobin A1c  3. Hyperlipidemia associated with type 2 diabetes mellitus ([place])  E11.69    E78.5   4. Family history of  prostate cancer  Z80.42 PSA  5. History of colonic polyps  Z86.010   6. Hypertension associated with diabetes ([place])  E11.59    I10   7. Dupuytren contracture  M72.0   8. Screening for prostate cancer  Z12.5 PSA    Meds ordered this encounter  Medications  . benazepril (LOTENSIN) 20 MG tablet    Sig: Take 1 tablet (20 mg total) by mouth daily.    Dispense:  90 tablet    Refill:  3  . Empagliflozin-metFORMIN HCl ER (SYNJARDY XR) 12.08-998 MG TB24    Sig: Take 1 tablet by mouth 2 (two) times daily.    Dispense:  180 tablet    Refill:  3  . tadalafil (CIALIS) 20 MG tablet    Sig: Take 1 tablet (20 mg total) by mouth every other day as needed for erectile dysfunction.    Dispense:  10 tablet    Refill:  11   Return precautions advised.  Garret Reddish, MD

## 2018-11-24 ENCOUNTER — Other Ambulatory Visit: Payer: Self-pay

## 2018-11-24 ENCOUNTER — Ambulatory Visit (INDEPENDENT_AMBULATORY_CARE_PROVIDER_SITE_OTHER): Payer: BC Managed Care – PPO | Admitting: Family Medicine

## 2018-11-24 ENCOUNTER — Encounter: Payer: Self-pay | Admitting: Family Medicine

## 2018-11-24 VITALS — BP 132/72 | HR 92 | Temp 97.9°F | Ht 71.5 in | Wt 166.0 lb

## 2018-11-24 DIAGNOSIS — Z125 Encounter for screening for malignant neoplasm of prostate: Secondary | ICD-10-CM

## 2018-11-24 DIAGNOSIS — I10 Essential (primary) hypertension: Secondary | ICD-10-CM

## 2018-11-24 DIAGNOSIS — Z8042 Family history of malignant neoplasm of prostate: Secondary | ICD-10-CM | POA: Diagnosis not present

## 2018-11-24 DIAGNOSIS — I152 Hypertension secondary to endocrine disorders: Secondary | ICD-10-CM

## 2018-11-24 DIAGNOSIS — E1159 Type 2 diabetes mellitus with other circulatory complications: Secondary | ICD-10-CM

## 2018-11-24 DIAGNOSIS — E1169 Type 2 diabetes mellitus with other specified complication: Secondary | ICD-10-CM

## 2018-11-24 DIAGNOSIS — E119 Type 2 diabetes mellitus without complications: Secondary | ICD-10-CM | POA: Diagnosis not present

## 2018-11-24 DIAGNOSIS — M720 Palmar fascial fibromatosis [Dupuytren]: Secondary | ICD-10-CM

## 2018-11-24 DIAGNOSIS — Z0000 Encounter for general adult medical examination without abnormal findings: Secondary | ICD-10-CM

## 2018-11-24 DIAGNOSIS — Z860100 Personal history of colon polyps, unspecified: Secondary | ICD-10-CM

## 2018-11-24 DIAGNOSIS — E785 Hyperlipidemia, unspecified: Secondary | ICD-10-CM

## 2018-11-24 LAB — CBC
HCT: 44.8 % (ref 39.0–52.0)
Hemoglobin: 15.3 g/dL (ref 13.0–17.0)
MCHC: 34.2 g/dL (ref 30.0–36.0)
MCV: 93.2 fl (ref 78.0–100.0)
Platelets: 238 10*3/uL (ref 150.0–400.0)
RBC: 4.81 Mil/uL (ref 4.22–5.81)
RDW: 13.6 % (ref 11.5–15.5)
WBC: 6.2 10*3/uL (ref 4.0–10.5)

## 2018-11-24 LAB — COMPREHENSIVE METABOLIC PANEL
ALT: 14 U/L (ref 0–53)
AST: 13 U/L (ref 0–37)
Albumin: 4.5 g/dL (ref 3.5–5.2)
Alkaline Phosphatase: 52 U/L (ref 39–117)
BUN: 21 mg/dL (ref 6–23)
CO2: 25 mEq/L (ref 19–32)
Calcium: 9.3 mg/dL (ref 8.4–10.5)
Chloride: 103 mEq/L (ref 96–112)
Creatinine, Ser: 0.92 mg/dL (ref 0.40–1.50)
GFR: 82.98 mL/min (ref >60.00)
Glucose, Bld: 191 mg/dL — ABNORMAL HIGH (ref 70–99)
Potassium: 4.2 mEq/L (ref 3.5–5.1)
Sodium: 137 mEq/L (ref 135–145)
Total Bilirubin: 0.5 mg/dL (ref 0.2–1.2)
Total Protein: 6.7 g/dL (ref 6.0–8.3)

## 2018-11-24 LAB — PSA: PSA: 0.41 ng/mL (ref 0.10–4.00)

## 2018-11-24 LAB — LIPID PANEL
Cholesterol: 128 mg/dL (ref 0–200)
HDL: 54 mg/dL (ref >39.00)
LDL Cholesterol: 61 mg/dL (ref 0–99)
NonHDL: 74.04
Total CHOL/HDL Ratio: 2
Triglycerides: 64 mg/dL (ref 0.0–149.0)
VLDL: 12.8 mg/dL (ref 0.0–40.0)

## 2018-11-24 LAB — POCT GLYCOSYLATED HEMOGLOBIN (HGB A1C): Hemoglobin A1C: 6 % — AB (ref 4.0–5.6)

## 2018-11-24 LAB — HEMOGLOBIN A1C: Hgb A1c MFr Bld: 6.4 % (ref 4.6–6.5)

## 2018-11-24 MED ORDER — BENAZEPRIL HCL 20 MG PO TABS: 20.0000 mg | ORAL_TABLET | Freq: Every day | ORAL | 3 refills | Status: DC

## 2018-11-24 MED ORDER — TADALAFIL 20 MG PO TABS: 20.0000 mg | ORAL_TABLET | ORAL | 11 refills | Status: DC | PRN

## 2018-11-24 MED ORDER — SYNJARDY XR 12.5-1000 MG PO TB24: 1.0000 | ORAL_TABLET | Freq: Two times a day (BID) | ORAL | 3 refills | Status: DC

## 2019-01-07 ENCOUNTER — Encounter: Payer: Self-pay | Admitting: Family Medicine

## 2019-01-07 LAB — HM DIABETES EYE EXAM

## 2019-04-17 ENCOUNTER — Other Ambulatory Visit: Payer: Self-pay | Admitting: Family Medicine

## 2019-05-27 ENCOUNTER — Ambulatory Visit: Payer: BC Managed Care – PPO | Admitting: Family Medicine

## 2019-05-27 ENCOUNTER — Encounter: Payer: Self-pay | Admitting: Family Medicine

## 2019-05-27 ENCOUNTER — Other Ambulatory Visit: Payer: Self-pay

## 2019-05-27 VITALS — BP 124/62 | HR 94 | Temp 98.6°F | Wt 166.0 lb

## 2019-05-27 DIAGNOSIS — E119 Type 2 diabetes mellitus without complications: Secondary | ICD-10-CM

## 2019-05-27 DIAGNOSIS — I10 Essential (primary) hypertension: Secondary | ICD-10-CM | POA: Diagnosis not present

## 2019-05-27 DIAGNOSIS — E1159 Type 2 diabetes mellitus with other circulatory complications: Secondary | ICD-10-CM | POA: Diagnosis not present

## 2019-05-27 DIAGNOSIS — E1169 Type 2 diabetes mellitus with other specified complication: Secondary | ICD-10-CM | POA: Diagnosis not present

## 2019-05-27 DIAGNOSIS — I152 Hypertension secondary to endocrine disorders: Secondary | ICD-10-CM

## 2019-05-27 DIAGNOSIS — E785 Hyperlipidemia, unspecified: Secondary | ICD-10-CM

## 2019-05-27 LAB — POCT GLYCOSYLATED HEMOGLOBIN (HGB A1C): Hemoglobin A1C: 6 % — AB (ref 4.0–5.6)

## 2019-05-27 MED ORDER — BENAZEPRIL HCL 20 MG PO TABS: 20.0000 mg | ORAL_TABLET | Freq: Every day | ORAL | 3 refills | Status: DC

## 2019-05-27 MED ORDER — SYNJARDY XR 12.5-1000 MG PO TB24: 1.0000 | ORAL_TABLET | Freq: Two times a day (BID) | ORAL | 3 refills | Status: DC

## 2019-05-27 MED ORDER — ATORVASTATIN CALCIUM 20 MG PO TABS: 20.0000 mg | ORAL_TABLET | Freq: Every day | ORAL | 3 refills | Status: DC

## 2019-05-27 MED ORDER — TRULICITY 1.5 MG/0.5ML ~~LOC~~ SOAJ: SUBCUTANEOUS | 3 refills | Status: DC

## 2019-05-27 NOTE — Progress Notes (Signed)
Phone [phone] In person visit   Subjective:   Justin [name] is a 64 y.o. year old very pleasant male patient who presents for/with See problem oriented charting Chief Complaint  Patient presents with  . Follow-up  . Diabetes    This visit occurred during the SARS-CoV-2 public health emergency.  Safety protocols were in place, including screening questions prior to the visit, additional usage of staff PPE, and extensive cleaning of exam room while observing appropriate contact time as indicated for disinfecting solutions.   Past Medical History-  Patient Active Problem List   Diagnosis Date Noted  . Diabetes mellitus without complication (HCC) 12/23/2006    Priority: High  . Hyperlipidemia associated with type 2 diabetes mellitus (HCC) 02/02/2018    Priority: Medium  . Family history of prostate cancer 02/02/2018    Priority: Medium  . Hypertension associated with diabetes (HCC) 12/23/2006    Priority: Medium  . Dupuytren contracture 11/24/2018    Priority: Low  . ED (erectile dysfunction)     Priority: Low  . History of colonic polyps 01/18/2008    Priority: Low  . Allergic rhinitis 11/07/2006    Priority: Low  . RBBB 07/14/2018    Medications- reviewed and updated Current Outpatient Medications  Medication Sig Dispense Refill  . acetaminophen (TYLENOL) 500 MG tablet Take 500 mg by mouth every 6 (six) hours as needed.    Marland Kitchen atorvastatin (LIPITOR) 20 MG tablet TAKE 1 TABLET (20 MG TOTAL) BY MOUTH DAILY. 30 tablet 8  . benazepril (LOTENSIN) 20 MG tablet Take 1 tablet (20 mg total) by mouth daily. 90 tablet 3  . cetirizine (ZYRTEC ALLERGY) 10 MG tablet Take 10 mg by mouth daily.    . Empagliflozin-metFORMIN HCl ER (SYNJARDY XR) 12.08-998 MG TB24 Take 1 tablet by mouth 2 (two) times daily. 180 tablet 3  . glucose blood (ONETOUCH VERIO) test strip Use as instructed 100 each 12  . Multiple Vitamin (MULTIVITAMIN) capsule Take 1 capsule by mouth daily.    Letta Pate  DELICA LANCETS FINE MISC 1 each by Does not apply route 2 (two) times daily. 100 each 3  . tadalafil (CIALIS) 20 MG tablet Take 1 tablet (20 mg total) by mouth every other day as needed for erectile dysfunction. 10 tablet 11  . TRULICITY 1.5 MG/0.5ML SOPN INJECT 1.5 MG INTO THE SKIN ONCE A WEEK. 2 pen 2   No current facility-administered medications for this visit.     Objective:  BP 124/62   Pulse 94   Temp 98.6 F (37 C) (Temporal)   Wt 166 lb (75.3 kg)   SpO2 96%   BMI 22.83 kg/m  Gen: NAD, resting comfortably CV: RRR no murmurs rubs or gallops Lungs: CTAB no crackles, wheeze, rhonchi Ext: no edema Skin: warm, dry    Assessment and Plan   # Diabetes S: Compliant with Synjardy XR 12.5-1000MG  and Trulicity 1.5MG  CBGs- does not check at home . No lows that he is aware of Exercise and diet- he does exercise by walking 5 times a week as weather allows. He averages 2 miles a day. He has been following a healthy diet and maintain weight.  Lab Results  Component Value Date   HGBA1C point-of-care 6.0 (A) 05/27/2019   HGBA1C 6.4 11/24/2018   HGBA1C 6.0 (A) 11/24/2018   A/P: Excellent control today on point-of-care machine.  We will continue current medications -continue good job with hydration on Synjardy  #hyperlipidemia S: compliant with atorvastatin 20 mg daily Lab  Results  Component Value Date   CHOL 128 11/24/2018   HDL 54.00 11/24/2018   LDLCALC 61 11/24/2018   LDLDIRECT 52.0 07/14/2018   TRIG 64.0 11/24/2018   CHOLHDL 2 11/24/2018   A/P: Excellent control with LDL under 70-continue current medication  #hypertension S: compliant with benazepril 20 mg BP Readings from Last 3 Encounters:  05/27/19 124/62  11/24/18 132/72  07/14/18 124/70  A/P:  Stable. Continue current medications.     Recommended follow up:  Already scheduled for physical  Future Appointments  Date Time Provider [place]  11/25/2019 10:40 AM Marin Olp, MD [facility]     Lab/Order associations:   ICD-10-CM   1. Diabetes mellitus without complication (HCC)  F00.7 POCT HgB A1C  2. Hypertension associated with diabetes ([place])  E11.59    I10   3. Hyperlipidemia associated with type 2 diabetes mellitus ([place])  E11.69    E78.5    Return precautions advised.  Garret Reddish, MD

## 2019-05-27 NOTE — Patient Instructions (Addendum)
Lab Results  Component Value Date   HGBA1C 6.0 (A) 05/27/2019   A1c looks great- keep up the great work!   See you in august for your physical which is already scheduled

## 2019-10-28 ENCOUNTER — Encounter: Payer: Self-pay | Admitting: Family Medicine

## 2019-10-28 ENCOUNTER — Other Ambulatory Visit: Payer: Self-pay

## 2019-10-28 ENCOUNTER — Ambulatory Visit: Payer: BC Managed Care – PPO | Admitting: Family Medicine

## 2019-10-28 VITALS — BP 128/62 | HR 87 | Temp 98.3°F | Ht 71.5 in | Wt 162.0 lb

## 2019-10-28 DIAGNOSIS — E785 Hyperlipidemia, unspecified: Secondary | ICD-10-CM

## 2019-10-28 DIAGNOSIS — E119 Type 2 diabetes mellitus without complications: Secondary | ICD-10-CM | POA: Diagnosis not present

## 2019-10-28 DIAGNOSIS — I10 Essential (primary) hypertension: Secondary | ICD-10-CM

## 2019-10-28 DIAGNOSIS — E1169 Type 2 diabetes mellitus with other specified complication: Secondary | ICD-10-CM

## 2019-10-28 DIAGNOSIS — I152 Hypertension secondary to endocrine disorders: Secondary | ICD-10-CM

## 2019-10-28 DIAGNOSIS — E1159 Type 2 diabetes mellitus with other circulatory complications: Secondary | ICD-10-CM

## 2019-10-28 DIAGNOSIS — R569 Unspecified convulsions: Secondary | ICD-10-CM | POA: Diagnosis not present

## 2019-10-28 LAB — POCT GLYCOSYLATED HEMOGLOBIN (HGB A1C): Hemoglobin A1C: 6.1 % — AB (ref 4.0–5.6)

## 2019-10-28 NOTE — Patient Instructions (Addendum)
There are no preventive care reminders to display for this patient. Seizure: We are happy that they were able to get CT done at visit to Urgent care. We are starting a referral to Neurology. Continue Seizure precautions for 6 months or until advised by neurology. If you have any similar symptoms please go to ED or urgent care as soon as possible.      We will call you within two weeks about your referral to neurology. If you do not hear within 3 weeks, give [place] a call.    Recommended follow up: No follow-ups on file.  Seizure, Adult A seizure is a sudden burst of abnormal electrical activity in the brain. Seizures usually last from 30 seconds to 2 minutes. They can cause many different symptoms. Usually, seizures are not harmful unless they last a long time. What are the causes? Common causes of this condition include:  Fever or infection.  Conditions that affect the brain, such as: ? A brain abnormality that you were born with. ? A brain or head injury. ? Bleeding in the brain. ? A tumor. ? Stroke. ? Brain disorders such as autism or cerebral palsy.  Low blood sugar.  Conditions that are passed from parent to child (are inherited).  Problems with substances, such as: ? Having a reaction to a drug or a medicine. ? Suddenly stopping the use of a substance (withdrawal). In some cases, the cause may not be known. A person who has repeated seizures over time without a clear cause has a condition called epilepsy. What increases the risk? You are more likely to get this condition if you have:  A family history of epilepsy.  Had a seizure in the past.  A brain disorder.  A history of head injury, lack of oxygen at birth, or strokes. What are the signs or symptoms? There are many types of seizures. The symptoms vary depending on the type of seizure you have. Examples of symptoms during a seizure include:  Shaking (convulsions).  Stiffness in the body.  Passing out (losing  consciousness).  Head nodding.  Staring.  Not responding to sound or touch.  Loss of bladder control and bowel control. Some people have symptoms right before and right after a seizure happens. Symptoms before a seizure may include:  Fear.  Worry (anxiety).  Feeling like you may vomit (nauseous).  Feeling like the room is spinning (vertigo).  Feeling like you saw or heard something before (dj vu).  Odd tastes or smells.  Changes in how you see. You may see flashing lights or spots. Symptoms after a seizure happens can include:  Confusion.  Sleepiness.  Headache.  Weakness on one side of the body. How is this treated? Most seizures will stop on their own in under 5 minutes. In these cases, no treatment is needed. Seizures that last longer than 5 minutes will usually need treatment. Treatment can include:  Medicines given through an IV tube.  Avoiding things that are known to cause your seizures. These can include medicines that you take for another condition.  Medicines to treat epilepsy.  Surgery to stop the seizures. This may be needed if medicines do not help. Follow these instructions at home: Medicines  Take over-the-counter and prescription medicines only as told by your doctor.  Do not eat or drink anything that may keep your medicine from working, such as alcohol. Activity  Do not do any activities that would be dangerous if you had another seizure, like driving or  swimming. Wait until your doctor says it is safe for you to do them.  If you live in the [place] ask your local DMV (department of motor vehicles) when you can drive.  Get plenty of rest. Teaching others Teach friends and family what to do when you have a seizure. They should:  Lay you on the ground.  Protect your head and body.  Loosen any tight clothing around your neck.  Turn you on your side.  Not hold you down.  Not put anything into your mouth.  Know whether or not you  need emergency care.  Stay with you until you are better.  General instructions  Contact your doctor each time you have a seizure.  Avoid anything that gives you seizures.  Keep a seizure diary. Write down: ? What you think caused each seizure. ? What you remember about each seizure.  Keep all follow-up visits as told by your doctor. This is important. Contact a doctor if:  You have another seizure.  You have seizures more often.  There is any change in what happens during your seizures.  You keep having seizures with treatment.  You have symptoms of being sick or having an infection. Get help right away if:  You have a seizure that: ? Lasts longer than 5 minutes. ? Is different than seizures you had before. ? Makes it harder to breathe. ? Happens after you hurt your head.  You have any of these symptoms after a seizure: ? Not being able to speak. ? Not being able to use a part of your body. ? Confusion. ? A bad headache.  You have two or more seizures in a row.  You do not wake up right after a seizure.  You get hurt during a seizure. These symptoms may be an emergency. Do not wait to see if the symptoms will go away. Get medical help right away. Call your local emergency services (911 in the [place] Do not drive yourself to the hospital. Summary  Seizures usually last from 30 seconds to 2 minutes. Usually, they are not harmful unless they last a long time.  Do not eat or drink anything that may keep your medicine from working, such as alcohol.  Teach friends and family what to do when you have a seizure.  Contact your doctor each time you have a seizure. This information is not intended to replace advice given to you by your health care provider. Make sure you discuss any questions you have with your health care provider. Document Revised: 06/19/2018 Document Reviewed: 06/19/2018 Elsevier Patient Education  2020 [employer].

## 2019-10-28 NOTE — Progress Notes (Signed)
Phone [phone] In person visit   Subjective:   Justin [name] is a 64 y.o. year old very pleasant male patient who presents for/with See problem oriented charting Chief Complaint  Patient presents with  . Seizures   This visit occurred during the SARS-CoV-2 public health emergency.  Safety protocols were in place, including screening questions prior to the visit, additional usage of staff PPE, and extensive cleaning of exam room while observing appropriate contact time as indicated for disinfecting solutions.   Past Medical History-  Patient Active Problem List   Diagnosis Date Noted  . Diabetes mellitus without complication (HCC) 12/23/2006    Priority: High  . Hyperlipidemia associated with type 2 diabetes mellitus (HCC) 02/02/2018    Priority: Medium  . Family history of prostate cancer 02/02/2018    Priority: Medium  . Hypertension associated with diabetes (HCC) 12/23/2006    Priority: Medium  . Dupuytren contracture 11/24/2018    Priority: Low  . ED (erectile dysfunction)     Priority: Low  . History of colonic polyps 01/18/2008    Priority: Low  . Allergic rhinitis 11/07/2006    Priority: Low  . RBBB 07/14/2018    Medications- reviewed and updated Current Outpatient Medications  Medication Sig Dispense Refill  . acetaminophen (TYLENOL) 500 MG tablet Take 500 mg by mouth every 6 (six) hours as needed.    Marland Kitchen atorvastatin (LIPITOR) 20 MG tablet Take 1 tablet (20 mg total) by mouth daily. 90 tablet 3  . benazepril (LOTENSIN) 20 MG tablet Take 1 tablet (20 mg total) by mouth daily. 90 tablet 3  . cetirizine (ZYRTEC ALLERGY) 10 MG tablet Take 10 mg by mouth daily.    . Dulaglutide (TRULICITY) 1.5 MG/0.5ML SOPN INJECT 1.5 MG INTO THE SKIN ONCE A WEEK. 3 pen 3  . Empagliflozin-metFORMIN HCl ER (SYNJARDY XR) 12.08-998 MG TB24 Take 1 tablet by mouth 2 (two) times daily. 180 tablet 3  . glucose blood (ONETOUCH VERIO) test strip Use as instructed 100 each 12  . Multiple  Vitamin (MULTIVITAMIN) capsule Take 1 capsule by mouth daily.    [name] 1 each by Does not apply route 2 (two) times daily. 100 each 3  . tadalafil (CIALIS) 20 MG tablet Take 1 tablet (20 mg total) by mouth every other day as needed for erectile dysfunction. 10 tablet 11   No current facility-administered medications for this visit.     Objective:  BP 128/62   Pulse 87   Temp 98.3 F (36.8 C) (Temporal)   Ht 5' 11.5" (1.816 m)   Wt 162 lb (73.5 kg)   SpO2 99%   BMI 22.28 kg/m   CV: RRR  Lungs: nonlabored, normal respiratory rate Abdomen: soft/nondistended   Neuro: CN II-XII intact, sensation and reflexes normal throughout, 5/5 muscle strength in bilateral upper and lower extremities. Normal finger to nose. Normal rapid alternating movements. No pronator drift. Normal romberg. Normal gait.      Assessment and Plan   # Seizure like activity   S:on July 4th had episode where he slumped over (leaning to the side and wife was pounding on his chest and shouting at him but would not come to ), loss of consciousness and had muscle tightness in his legs and some tremulousness. It lasted about 25 seconds then he came to up had no memory of the incident- when he came back to- seemed to be his normal self. He did check his blood sugar soon after  and was 135. Denies being over heated- was only 75 degrees. Had perhaps 3 alcoholic beverages. He denies any injury before or during the event. Was seen at urgent care had some labs and EKG done and all were negative. He does admit to having limited sleep for three day before symptoms. He denies any issues after that time (maybe only 2-3 hours of sleep per night- had been dealing with some cramping/charlie horse which made it hard to sleep plus had driven 10-11 hours to [place]).   On paperwork from [place] [facility] emergent care mentions new onset seizure in adult. Had head CT and reportedly reassuring.  A/P: 64 year old male  with loss of consciousness and then muscle tightness in the leg and tremulousness concerning for possible seizure-like activity. -I do wonder if patient was simply stretched by several nights of poor sleep, 3 alcoholic beverages, long drive but I am not confident enough in this to prevent further evaluation-urgent referral to neurology recommended and patient agreeable -Patient already had head CT-we are going to get records as well as blood work from his visit. -Seizure precautions reviewed and should be continued until neurology follow-up -No obvious medications as potential cause-reviewed chronic illness as below - not hyper or hypoglycemia based on cbg 135 after episode -EKG reportedly reassuring- will get records- do not strongly doubt cardiac though if recurrent issues continue further workup  # Diabetes S: Medication:synjardy twice daily , trulicity CBGs- denies highs or lows Lab Results  Component Value Date   HGBA1C 6.1 (A) 10/28/2019   HGBA1C 6.0 (A) 05/27/2019   HGBA1C 6.4 11/24/2018   A/P: well controlled on a1c at 6.1 today  point-of-care today-we will get labs from urgent care in addition.   #hyperlipidemia S: Medication:atorvastatin 20 mg  Lab Results  Component Value Date   CHOL 128 11/24/2018   HDL 54.00 11/24/2018   LDLCALC 61 11/24/2018   LDLDIRECT 52.0 07/14/2018   TRIG 64.0 11/24/2018   CHOLHDL 2 11/24/2018   A/P: Stable. Continue current medications.   #hypertension S: medication: benazepril 20mg  BP Readings from Last 3 Encounters:  10/28/19 128/62  05/27/19 124/62  11/24/18 132/72  A/P: Stable. Continue current medications.   Recommended follow up: 6 month visit or sooner if needed (main next steps are neurology) Future Appointments  Date Time Provider Department Center  05/02/2020 11:00 AM 05/04/2020, MD [facility]    Lab/Order associations:   ICD-10-CM   1. Seizure-like activity (HCC)  R56.9 Ambulatory referral to Neurology  2. Diabetes  mellitus without complication (HCC)  E11.9 POCT HgB A1C  3. Hypertension associated with diabetes (HCC)  E11.59    I10   4. Hyperlipidemia associated with type 2 diabetes mellitus (HCC)  E11.69    E78.5     Return precautions advised.  09-10-1992, MD

## 2019-11-01 ENCOUNTER — Other Ambulatory Visit: Payer: Self-pay

## 2019-11-01 ENCOUNTER — Encounter: Payer: Self-pay | Admitting: Neurology

## 2019-11-01 ENCOUNTER — Ambulatory Visit: Payer: BC Managed Care – PPO | Admitting: Neurology

## 2019-11-01 VITALS — BP 127/67 | HR 92 | Ht 72.0 in | Wt 166.0 lb

## 2019-11-01 DIAGNOSIS — R569 Unspecified convulsions: Secondary | ICD-10-CM | POA: Diagnosis not present

## 2019-11-01 DIAGNOSIS — Z5181 Encounter for therapeutic drug level monitoring: Secondary | ICD-10-CM | POA: Diagnosis not present

## 2019-11-01 NOTE — Progress Notes (Signed)
Reason for visit: Seizure-like episode  Referring physician: Dr. Modesto Charon Justin [name] is a 64 y.o. male  History of present illness:  Justin [name] is a 64 year old left-handed white male with a history of diabetes.  The patient an episode on 10/17/2019 while traveling to [place], [place].  He had not slept well for about 3 days prior to the event, he was consuming some alcohol, he had 3 beverages.  He had an event that was witnessed by his wife.  The patient was sitting in a chair, and he apparently started slumping over.  His wife tried to wake him up, but was unable to do so initially.  She noted that transiently had some stiffening of the legs and some twitching, but no extension of the axial muscles or jerking of the arms.  The patient started responding within about 25 seconds, initially he seemed to be somewhat dazed but quickly regained normal consciousness.  They checked a blood sugar which was 135.  His most recent hemoglobin A1c is 6.1.  The patient himself had no warning of the event, he thought that he had just fallen asleep.  The patient felt somewhat foggy headed afterwards.  The patient has never had similar events previously or since.  He reports no headaches, dizziness, vision changes, or any numbness or weakness of the face, arms, legs.  He denies any family history of seizures or blackout events.  He denies any chest pain, palpitations of the heart, or shortness of breath.  He went to an urgent medical care, they did a CT scan of the brain that was unremarkable, blood work apparently showed some elevation of the sodium level.  He does not have the results of the blood work.  He was told the CT of the head was normal.  An EKG was done and apparently was unremarkable.  The results of the work-up are not available to me.  He comes to this office for further evaluation.  Past Medical History:  Diagnosis Date  . Allergy   . Diabetes mellitus   . ED (erectile dysfunction)     sildenafil 100 mg sparingly  . History of colonic polyps   . Hypertension   . PROSTATITIS, ACUTE 12/02/2009   Qualifier: Diagnosis of  By: Alphonsus Sias MD, Ronnette Hila     Past Surgical History:  Procedure Laterality Date  . no prior surgery      Family History  Problem Relation Age of Onset  . Dementia Father   . CAD Father        mid 26s, never smoker  . Hypertension Mother   . Stroke Mother        1, first stroke 55. never smoker  . Diabetes Mother        in late life  . Hypertension Sister   . Diabetes Brother   . Hypertension Brother   . Prostate cancer Brother        78  . Cerebral palsy Daughter   . Cerebral palsy Daughter   . Pneumonia Maternal Grandfather        died from this  . Diabetes Paternal Grandfather   . Colon cancer Neg Hx     Social history:  reports that he has never smoked. He has never used smokeless tobacco. He reports current alcohol use. He reports that he does not use drugs.  Medications:  Prior to Admission medications   Medication Sig Start Date End Date Taking? Authorizing Provider  acetaminophen (TYLENOL)  500 MG tablet Take 500 mg by mouth every 6 (six) hours as needed.    [provider]  atorvastatin (LIPITOR) 20 MG tablet Take 1 tablet (20 mg total) by mouth daily. 05/27/19   Shelva Majestic, MD  benazepril (LOTENSIN) 20 MG tablet Take 1 tablet (20 mg total) by mouth daily. 05/27/19   Shelva Majestic, MD  cetirizine (ZYRTEC ALLERGY) 10 MG tablet Take 10 mg by mouth daily.    [provider]  Dulaglutide (TRULICITY) 1.5 MG/0.5ML SOPN INJECT 1.5 MG INTO THE SKIN ONCE A WEEK. 05/27/19   Shelva Majestic, MD  Empagliflozin-metFORMIN HCl ER (SYNJARDY XR) 12.08-998 MG TB24 Take 1 tablet by mouth 2 (two) times daily. 05/27/19   Shelva Majestic, MD  glucose blood [facility] VERIO) test strip Use as instructed 05/04/12   Gordy Savers, MD  Multiple Vitamin (MULTIVITAMIN) capsule Take 1 capsule by mouth daily.    [provider]  [facility] DELICA LANCETS FINE MISC 1 each by Does not apply route 2 (two) times daily. 05/04/12   Gordy Savers, MD  tadalafil (CIALIS) 20 MG tablet Take 1 tablet (20 mg total) by mouth every other day as needed for erectile dysfunction. 11/24/18   Shelva Majestic, MD     No Known Allergies  ROS:  Out of a complete 14 system review of symptoms, the patient complains only of the following symptoms, and all other reviewed systems are negative.  Blackout event  Blood pressure 127/67, pulse 92, height 6' (1.829 m), weight 166 lb (75.3 kg).  Physical Exam  General: The patient is alert and cooperative at the time of the examination.  Eyes: Pupils are equal, round, and reactive to light. Discs are flat bilaterally.  Neck: The neck is supple, no carotid bruits are noted.  Respiratory: The respiratory examination is clear.  Cardiovascular: The cardiovascular examination reveals a regular rate and rhythm, no obvious murmurs or rubs are noted.  Skin: Extremities are without significant edema.  Neurologic Exam  Mental status: The patient is alert and oriented x 3 at the time of the examination. The patient has apparent normal recent and remote memory, with an apparently normal attention span and concentration ability.  Cranial nerves: Facial symmetry is present. There is good sensation of the face to pinprick and soft touch bilaterally. The strength of the facial muscles and the muscles to head turning and shoulder shrug are normal bilaterally. Speech is well enunciated, no aphasia or dysarthria is noted. Extraocular movements are full. Visual fields are full. The tongue is midline, and the patient has symmetric elevation of the soft palate. No obvious hearing deficits are noted.  Motor: The motor testing reveals 5 over 5 strength of all 4 extremities. Good symmetric motor tone is noted throughout.  Sensory: Sensory testing is intact to pinprick, soft touch, vibration  sensation, and position sense on all 4 extremities. No evidence of extinction is noted.  Coordination: Cerebellar testing reveals good finger-nose-finger and heel-to-shin bilaterally.  Gait and station: Gait is normal. Tandem gait is normal. Romberg is negative. No drift is seen.  Reflexes: Deep tendon reflexes are symmetric and normal bilaterally. Toes are downgoing bilaterally.   Assessment/Plan:  1.  Seizure-like episode  The patient had an event that was quite brief and associated with some stiffening of the legs.  The patient had been sleep deprived, was drinking alcohol at the time of the event.  The event was quite brief with rapid normalization of mental  status.  The history is not overwhelmingly suggestive of a seizure.  The patient will undergo MRI of the brain and an EEG study.  If the studies are unremarkable, the patient may return to full normal activity.  If the episode recurs, a cardiac monitor study should be done.  Blood work was done today to document renal function.   Marlan Palau MD 11/01/2019 8:48 AM  [facility] [address] [place], [place] [address]  Phone [phone] Fax [phone]

## 2019-11-02 LAB — BASIC METABOLIC PANEL
BUN/Creatinine Ratio: 26 — ABNORMAL HIGH (ref 10–24)
BUN: 22 mg/dL (ref 8–27)
CO2: 21 mmol/L (ref 20–29)
Calcium: 9.5 mg/dL (ref 8.6–10.2)
Chloride: 103 mmol/L (ref 96–106)
Creatinine, Ser: 0.84 mg/dL (ref 0.76–1.27)
GFR calc Af Amer: 107 mL/min/{1.73_m2} (ref >59)
GFR calc non Af Amer: 93 mL/min/{1.73_m2} (ref >59)
Glucose: 229 mg/dL — ABNORMAL HIGH (ref 65–99)
Potassium: 4.3 mmol/L (ref 3.5–5.2)
Sodium: 139 mmol/L (ref 134–144)

## 2019-11-09 ENCOUNTER — Telehealth: Payer: Self-pay | Admitting: Neurology

## 2019-11-09 NOTE — Telephone Encounter (Signed)
no to the covid questions MR Brain w/wo contrast Dr. Lockie Mola Auth: [id number] (exp. 11/09/19 to 05/06/20). Patient is scheduled at [facility] for 11/10/19.

## 2019-11-10 ENCOUNTER — Ambulatory Visit: Payer: BC Managed Care – PPO

## 2019-11-10 DIAGNOSIS — R569 Unspecified convulsions: Secondary | ICD-10-CM

## 2019-11-10 MED ORDER — GADOBENATE DIMEGLUMINE 529 MG/ML IV SOLN
15.0000 mL | Freq: Once | INTRAVENOUS | Status: AC | PRN
Start: 2019-11-10 — End: 2019-11-10
  Administered 2019-11-10: 15 mL via INTRAVENOUS

## 2019-11-14 ENCOUNTER — Telehealth: Payer: Self-pay | Admitting: Neurology

## 2019-11-14 NOTE — Telephone Encounter (Signed)
  I called the patient.  MRI of the brain shows very minimal punctate white matter changes, the patient does have diabetes and hypertension, nothing that would cause a blackout or seizure.  The EEG study is pending.  MRI brain 11/12/19:  IMPRESSION: Unremarkable MRI scan of the brain with and without contrast showing only mild changes of chronic small vessel disease.  Incidental changes of chronic paranasal maxillary sinusitis with small mucous retention cyst/polyp in the right maxillary antrum is noted.

## 2019-11-24 ENCOUNTER — Ambulatory Visit: Payer: BC Managed Care – PPO

## 2019-11-24 DIAGNOSIS — R569 Unspecified convulsions: Secondary | ICD-10-CM | POA: Diagnosis not present

## 2019-11-25 ENCOUNTER — Encounter: Payer: BC Managed Care – PPO | Admitting: Family Medicine

## 2019-11-29 ENCOUNTER — Telehealth: Payer: Self-pay | Admitting: Neurology

## 2019-11-29 NOTE — Procedures (Signed)
    History:  Justin [name] is a 64 year old gentleman with a history of diabetes.  The patient had an event of slumping over, with some stiffening of the legs and twitching.  The patient began responding quickly within 25 seconds.  The patient is being evaluated for possible seizure.   This is a routine EEG.  No skull defects were noted.  Medication include Lipitor, Lotensin, Zyrtec, Trulicity, Metformin, multivitamins, and Cialis.  EEG classification: Normal awake  Description of the recording: The background rhythms of this recording consists of a fairly well modulated medium amplitude alpha rhythm of 9 Hz that is reactive to eye opening and closure. As the record progresses, the patient appears to remain in the waking state throughout the recording. Photic stimulation was performed, resulting in a bilateral and symmetric photic driving response. Hyperventilation was also performed, resulting in a minimal buildup of the background rhythm activities without significant slowing seen. At no time during the recording does there appear to be evidence of spike or spike wave discharges or evidence of focal slowing. EKG monitor shows no evidence of cardiac rhythm abnormalities with a heart rate of 84.  Impression: This is a normal EEG recording in the waking state. No evidence of ictal or interictal discharges are seen.

## 2019-11-29 NOTE — Telephone Encounter (Signed)
I called the patient.  MRI of the brain showed minimal white matter disease, EEG study is normal.  The patient is not any further events, I would probably not restrict activities at this point.  He will call me if any other type event occurs.

## 2020-05-02 ENCOUNTER — Encounter: Payer: BC Managed Care – PPO | Admitting: Family Medicine

## 2020-05-03 ENCOUNTER — Other Ambulatory Visit: Payer: Self-pay | Admitting: Family Medicine

## 2020-05-19 ENCOUNTER — Other Ambulatory Visit: Payer: Self-pay | Admitting: Family Medicine

## 2020-06-12 ENCOUNTER — Other Ambulatory Visit: Payer: Self-pay | Admitting: Family Medicine

## 2020-07-23 ENCOUNTER — Emergency Department (HOSPITAL_BASED_OUTPATIENT_CLINIC_OR_DEPARTMENT_OTHER): Payer: Medicare PPO

## 2020-07-23 ENCOUNTER — Encounter (HOSPITAL_BASED_OUTPATIENT_CLINIC_OR_DEPARTMENT_OTHER): Payer: Self-pay | Admitting: *Deleted

## 2020-07-23 ENCOUNTER — Observation Stay (HOSPITAL_BASED_OUTPATIENT_CLINIC_OR_DEPARTMENT_OTHER)
Admission: EM | Admit: 2020-07-23 | Discharge: 2020-07-25 | Disposition: A | Discharge status: Home / Self Care | Payer: Medicare PPO | Attending: Internal Medicine | Admitting: Internal Medicine

## 2020-07-23 ENCOUNTER — Other Ambulatory Visit: Payer: Self-pay

## 2020-07-23 DIAGNOSIS — Z7984 Long term (current) use of oral hypoglycemic drugs: Secondary | ICD-10-CM | POA: Diagnosis not present

## 2020-07-23 DIAGNOSIS — I110 Hypertensive heart disease with heart failure: Secondary | ICD-10-CM | POA: Insufficient documentation

## 2020-07-23 DIAGNOSIS — E1165 Type 2 diabetes mellitus with hyperglycemia: Secondary | ICD-10-CM | POA: Diagnosis not present

## 2020-07-23 DIAGNOSIS — I5032 Chronic diastolic (congestive) heart failure: Secondary | ICD-10-CM | POA: Insufficient documentation

## 2020-07-23 DIAGNOSIS — Z20822 Contact with and (suspected) exposure to covid-19: Secondary | ICD-10-CM | POA: Insufficient documentation

## 2020-07-23 DIAGNOSIS — Z79899 Other long term (current) drug therapy: Secondary | ICD-10-CM | POA: Insufficient documentation

## 2020-07-23 DIAGNOSIS — R0789 Other chest pain: Secondary | ICD-10-CM | POA: Diagnosis present

## 2020-07-23 DIAGNOSIS — R079 Chest pain, unspecified: Secondary | ICD-10-CM | POA: Diagnosis not present

## 2020-07-23 DIAGNOSIS — I441 Atrioventricular block, second degree: Secondary | ICD-10-CM | POA: Diagnosis not present

## 2020-07-23 DIAGNOSIS — E785 Hyperlipidemia, unspecified: Secondary | ICD-10-CM | POA: Insufficient documentation

## 2020-07-23 HISTORY — DX: Type 2 diabetes mellitus with other specified complication: E11.69

## 2020-07-23 LAB — RAPID URINE DRUG SCREEN, HOSP PERFORMED
Amphetamines: NOT DETECTED
Barbiturates: NOT DETECTED
Benzodiazepines: NOT DETECTED
Cocaine: NOT DETECTED
Opiates: NOT DETECTED
Tetrahydrocannabinol: NOT DETECTED

## 2020-07-23 LAB — CBC
HCT: 49.4 % (ref 39.0–52.0)
Hemoglobin: 16.6 g/dL (ref 13.0–17.0)
MCH: 30.9 pg (ref 26.0–34.0)
MCHC: 33.6 g/dL (ref 30.0–36.0)
MCV: 91.8 fL (ref 80.0–100.0)
Platelets: 286 10*3/uL (ref 150–400)
RBC: 5.38 MIL/uL (ref 4.22–5.81)
RDW: 13.1 % (ref 11.5–15.5)
WBC: 9.6 10*3/uL (ref 4.0–10.5)
nRBC: 0 % (ref 0.0–0.2)

## 2020-07-23 LAB — HIV ANTIBODY (ROUTINE TESTING W REFLEX): HIV Screen 4th Generation wRfx: NONREACTIVE

## 2020-07-23 LAB — HEMOGLOBIN A1C
Hgb A1c MFr Bld: 6.9 % — ABNORMAL HIGH (ref 4.8–5.6)
Mean Plasma Glucose: 151.33 mg/dL

## 2020-07-23 LAB — BASIC METABOLIC PANEL WITH GFR
Anion gap: 11 (ref 5–15)
BUN: 22 mg/dL (ref 8–23)
CO2: 24 mmol/L (ref 22–32)
Calcium: 9.6 mg/dL (ref 8.9–10.3)
Chloride: 102 mmol/L (ref 98–111)
Creatinine, Ser: 0.94 mg/dL (ref 0.61–1.24)
GFR, Estimated: >60 mL/min
Glucose, Bld: 215 mg/dL — ABNORMAL HIGH (ref 70–99)
Potassium: 4.3 mmol/L (ref 3.5–5.1)
Sodium: 137 mmol/L (ref 135–145)

## 2020-07-23 LAB — TROPONIN I (HIGH SENSITIVITY)
Troponin I (High Sensitivity): 11 ng/L (ref <18)
Troponin I (High Sensitivity): 9 ng/L (ref <18)

## 2020-07-23 LAB — D-DIMER, QUANTITATIVE: D-Dimer, Quant: 0.29 ug{FEU}/mL (ref 0.00–0.50)

## 2020-07-23 LAB — RESP PANEL BY RT-PCR (FLU A&B, COVID) ARPGX2
Influenza A by PCR: NEGATIVE
Influenza B by PCR: NEGATIVE
SARS Coronavirus 2 by RT PCR: NEGATIVE

## 2020-07-23 LAB — GLUCOSE, CAPILLARY
Glucose-Capillary: 97 mg/dL (ref 70–99)
Glucose-Capillary: 136 mg/dL — ABNORMAL HIGH (ref 70–99)

## 2020-07-23 MED ORDER — BENAZEPRIL HCL 20 MG PO TABS: 20.0000 mg | ORAL_TABLET | Freq: Every day | ORAL | Status: DC

## 2020-07-23 MED ORDER — METOPROLOL TARTRATE 100 MG PO TABS: 100.0000 mg | ORAL_TABLET | Freq: Once | ORAL | Status: DC

## 2020-07-23 MED ORDER — ONDANSETRON HCL 4 MG/2ML IJ SOLN: 4.0000 mg | Freq: Four times a day (QID) | INTRAMUSCULAR | Status: DC | PRN

## 2020-07-23 MED ORDER — BENAZEPRIL HCL 40 MG PO TABS
40.0000 mg | ORAL_TABLET | Freq: Every day | ORAL | Status: DC
  Administered 2020-07-24 – 2020-07-25 (×2): 40 mg via ORAL
  Filled 2020-07-23 (×2): qty 1

## 2020-07-23 MED ORDER — DULAGLUTIDE 1.5 MG/0.5ML ~~LOC~~ SOAJ: 1.5000 mg | SUBCUTANEOUS | Status: DC

## 2020-07-23 MED ORDER — ASPIRIN EC 81 MG PO TBEC
81.0000 mg | DELAYED_RELEASE_TABLET | Freq: Every day | ORAL | Status: DC
  Administered 2020-07-24 – 2020-07-25 (×2): 81 mg via ORAL
  Filled 2020-07-23 (×2): qty 1

## 2020-07-23 MED ORDER — EMPAGLIFLOZIN-METFORMIN HCL ER 12.5-1000 MG PO TB24: 1.0000 | ORAL_TABLET | Freq: Two times a day (BID) | ORAL | Status: DC

## 2020-07-23 MED ORDER — INSULIN ASPART 100 UNIT/ML ~~LOC~~ SOLN
0.0000 [IU] | Freq: Three times a day (TID) | SUBCUTANEOUS | Status: DC
Start: 2020-07-24 — End: 2020-07-25
  Administered 2020-07-24: 5 [IU] via SUBCUTANEOUS
  Administered 2020-07-24 – 2020-07-25 (×3): 2 [IU] via SUBCUTANEOUS

## 2020-07-23 MED ORDER — ATORVASTATIN CALCIUM 10 MG PO TABS
20.0000 mg | ORAL_TABLET | Freq: Every day | ORAL | Status: DC
  Administered 2020-07-24 – 2020-07-25 (×2): 20 mg via ORAL
  Filled 2020-07-23 (×2): qty 2

## 2020-07-23 MED ORDER — ASPIRIN 81 MG PO CHEW
324.0000 mg | CHEWABLE_TABLET | Freq: Once | ORAL | Status: AC
  Administered 2020-07-23: 324 mg via ORAL
  Filled 2020-07-23: qty 4

## 2020-07-23 MED ORDER — LORATADINE 10 MG PO TABS
10.0000 mg | ORAL_TABLET | Freq: Every day | ORAL | Status: DC
  Filled 2020-07-23 (×2): qty 1

## 2020-07-23 MED ORDER — ACETAMINOPHEN 325 MG PO TABS: 650.0000 mg | ORAL_TABLET | ORAL | Status: DC | PRN

## 2020-07-23 MED ORDER — ACETAMINOPHEN 500 MG PO TABS: 500.0000 mg | ORAL_TABLET | Freq: Four times a day (QID) | ORAL | Status: DC | PRN

## 2020-07-23 NOTE — ED Notes (Signed)
Attempted to call report and nurse unavailable to take report at this time. Call back number provided.

## 2020-07-23 NOTE — ED Notes (Signed)
At this time he leaves with our [facility] team. I have not provided any direct care of or to this pt.

## 2020-07-23 NOTE — H&P (Signed)
History and Physical    Justin [name] BPZ:[id number] DOB: 1955-09-12 DOA: 07/23/2020  PCP: Shelva Majestic, MD (Confirm with patient/family/NH records and if not entered, this has to be entered at [facility] point of entry) Patient coming from: Home  I have personally briefly reviewed patient's old medical records in [facility]  Chief Complaint: Chest pain  HPI: Justin [name] is a 65 y.o. male with medical history significant of HTN, IIDM, HLD, presented with new onset chest pain.  Patient woke up in the middle of night feeling sharp-like chest pain 10/10 retrosternal nonradiating, associated with frequent nausea and sweating.  Whole episode lasted few minutes and then subsided based on no exacerbating or relieving factor.  Patient went home to sleep through the night however he woke up this morning by the same type pain again and last for few minutes.  He also started to feel lightheadedness this morning.  Denies any fever chills no cough.  Non-smoker, denies any use of drugs. ED Course: Heart rate varies from 50s to 150s appears to be sinus.  EKG showed RBBB.  Trop negative x2.  Chest x-ray clear.  Review of Systems: As per HPI otherwise 14 point review of systems negative.    Past Medical History:  Diagnosis Date  . Allergy   . Diabetes mellitus   . ED (erectile dysfunction)    sildenafil 100 mg sparingly  . History of colonic polyps   . Hypertension   . PROSTATITIS, ACUTE 12/02/2009   Qualifier: Diagnosis of  By: Alphonsus Sias MD, Ronnette Hila     Past Surgical History:  Procedure Laterality Date  . no prior surgery       reports that he has never smoked. He has never used smokeless tobacco. He reports current alcohol use. He reports that he does not use drugs.  No Known Allergies  Family History  Problem Relation Age of Onset  . Dementia Father   . CAD Father        mid 61s, never smoker  . Hypertension Mother   . Stroke Mother        68, first stroke 15. never  smoker  . Diabetes Mother        in late life  . Hypertension Sister   . Diabetes Brother   . Hypertension Brother   . Prostate cancer Brother        47  . Cerebral palsy Daughter   . Cerebral palsy Daughter   . Pneumonia Maternal Grandfather        died from this  . Diabetes Paternal Grandfather   . Colon cancer Neg Hx      Prior to Admission medications   Medication Sig Start Date End Date Taking? Authorizing Provider  acetaminophen (TYLENOL) 500 MG tablet Take 500 mg by mouth every 6 (six) hours as needed.    [provider]  atorvastatin (LIPITOR) 20 MG tablet TAKE 1 TABLET BY MOUTH EVERY DAY 06/14/20   Shelva Majestic, MD  benazepril (LOTENSIN) 20 MG tablet TAKE 1 TABLET BY MOUTH EVERY DAY 06/14/20   Shelva Majestic, MD  cetirizine (ZYRTEC ALLERGY) 10 MG tablet Take 10 mg by mouth daily.    [provider]  glucose blood (ONETOUCH VERIO) test strip Use as instructed 05/04/12   Gordy Savers, MD  Multiple Vitamin (MULTIVITAMIN) capsule Take 1 capsule by mouth daily.    [provider]  [facility] DELICA LANCETS FINE MISC 1 each by Does not apply  route 2 (two) times daily. 05/04/12   Gordy Savers, MD  SYNJARDY XR 12.08-998 MG TB24 TAKE 1 TABLET BY MOUTH TWICE A DAY 05/19/20   Shelva Majestic, MD  tadalafil (CIALIS) 20 MG tablet Take 1 tablet (20 mg total) by mouth every other day as needed for erectile dysfunction. 11/24/18   Shelva Majestic, MD  TRULICITY 1.5 MG/0.5ML SOPN INJECT 1.5 MG INTO THE SKIN ONCE A WEEK. 05/03/20   Shelva Majestic, MD    Physical Exam: Vitals:   07/23/20 1217 07/23/20 1321 07/23/20 1537 07/23/20 1538  BP: (!) 155/58 (!) 150/62 (!) 155/78   Pulse:  (!) 53 95   Resp:  16 16   Temp:    97.8 F (36.6 C)  TempSrc:    Oral  SpO2:  96% 96%   Weight:      Height:        Constitutional: NAD, calm, comfortable Vitals:   07/23/20 1217 07/23/20 1321 07/23/20 1537 07/23/20 1538  BP: (!) 155/58 (!) 150/62 (!)  155/78   Pulse:  (!) 53 95   Resp:  16 16   Temp:    97.8 F (36.6 C)  TempSrc:    Oral  SpO2:  96% 96%   Weight:      Height:       Eyes: PERRL, lids and conjunctivae normal ENMT: Mucous membranes are moist. Posterior pharynx clear of any exudate or lesions.Normal dentition.  Neck: normal, supple, no masses, no thyromegaly Respiratory: clear to auscultation bilaterally, no wheezing, no crackles. Normal respiratory effort. No accessory muscle use.  Cardiovascular: Regular rate and rhythm, no murmurs / rubs / gallops. No extremity edema. 2+ pedal pulses. No carotid bruits.  Abdomen: no tenderness, no masses palpated. No hepatosplenomegaly. Bowel sounds positive.  Musculoskeletal: no clubbing / cyanosis. No joint deformity upper and lower extremities. Good ROM, no contractures. Normal muscle tone.  Skin: no rashes, lesions, ulcers. No induration Neurologic: CN 2-12 grossly intact. Sensation intact, DTR normal. Strength 5/5 in all 4.  Psychiatric: Normal judgment and insight. Alert and oriented x 3. Normal mood.     Labs on Admission: I have personally reviewed following labs and imaging studies  CBC: Recent Labs  Lab 07/23/20 1220  WBC 9.6  HGB 16.6  HCT 49.4  MCV 91.8  PLT 286   Basic Metabolic Panel: Recent Labs  Lab 07/23/20 1220  NA 137  K 4.3  CL 102  CO2 24  GLUCOSE 215*  BUN 22  CREATININE 0.94  CALCIUM 9.6   GFR: Estimated Creatinine Clearance: 80.5 mL/min (by C-G formula based on SCr of 0.94 mg/dL). Liver Function Tests: No results for input(s): AST, ALT, ALKPHOS, BILITOT, PROT, ALBUMIN in the last 168 hours. No results for input(s): LIPASE, AMYLASE in the last 168 hours. No results for input(s): AMMONIA in the last 168 hours. Coagulation Profile: No results for input(s): INR, PROTIME in the last 168 hours. Cardiac Enzymes: No results for input(s): CKTOTAL, CKMB, CKMBINDEX, TROPONINI in the last 168 hours. BNP (last 3 results) No results for input(s):  PROBNP in the last 8760 hours. HbA1C: No results for input(s): HGBA1C in the last 72 hours. CBG: No results for input(s): GLUCAP in the last 168 hours. Lipid Profile: No results for input(s): CHOL, HDL, LDLCALC, TRIG, CHOLHDL, LDLDIRECT in the last 72 hours. Thyroid Function Tests: No results for input(s): TSH, T4TOTAL, FREET4, T3FREE, THYROIDAB in the last 72 hours. Anemia Panel: No results for input(s): VITAMINB12, FOLATE, FERRITIN,  TIBC, IRON, RETICCTPCT in the last 72 hours. Urine analysis:    Component Value Date/Time   COLORURINE yellow 02/20/2010 0820   APPEARANCEUR Clear 02/20/2010 0820   LABSPEC >=1.030 02/20/2010 0820   PHURINE 5.0 02/20/2010 0820   HGBUR negative 02/20/2010 0820   BILIRUBINUR n 02/21/2015 0852   PROTEINUR n 02/21/2015 0852   UROBILINOGEN 0.2 02/21/2015 0852   UROBILINOGEN 0.2 02/20/2010 0820   NITRITE n 02/21/2015 0852   NITRITE negative 02/20/2010 0820   LEUKOCYTESUR Negative 02/21/2015 0852    Radiological Exams on Admission: DG Chest 2 View  Result Date: 07/23/2020 CLINICAL DATA:  Chest tightness, shortness of breath, nausea and diaphoresis. EXAM: CHEST - 2 VIEW COMPARISON:  June 15, 2018 FINDINGS: Cardiomediastinal silhouette is normal. Mediastinal contours appear intact. There is no evidence of focal airspace consolidation, pleural effusion or pneumothorax. Osseous structures are without acute abnormality. Soft tissues are grossly normal. IMPRESSION: No active cardiopulmonary disease. Electronically Signed   By: Ted Mcalpine M.D.   On: 07/23/2020 13:54    EKG: Independently reviewed. Sinus, RBBB,  T inversion on V1.  Assessment/Plan Active Problems:   Chest pain  (please populate well all problems here in Problem List. (For example, if patient is on BP meds at home and you resume or decide to hold them, it is a problem that needs to be her. Same for CAD, COPD, HLD and so on)  Chest pain -Atypical, ACS ruled out. -given the history of  diabetes hypertension and hyperlipidemia, ASCVD 10 years=21.5.  Ordered carotid artery CT angiogram for further evaluate CAD risk.  Also ordered echocardiogram to evaluate local wall motion abnormalities. -Consider consult cardiology if coronary CT abnormal, otherwise patient likely can be discharged tomorrow and follow-up with cardiologist for further outpatient stress test. -UDS -Start ASA  Tachy/Brady with presyncope -Again suspect early CAD, work-up as above. -Continue telemetry monitoring and consider outpatient Holter monitoring.  IIDM with hyperglycemia -Continue home p.o. diabetic medications -Add sliding scale  HTN, uncontroled -Increase ACEI  HLD -Continue Statin  DVT prophylaxis: SCD Code Status: Full Code Family Communication: None at bedside Disposition Plan: Expect less than 2 midnight hospital stay Consults called: None Admission status: Tele obs  Emeline General MD [facility] Pager [phone]  07/23/2020, 4:15 PM

## 2020-07-23 NOTE — ED Triage Notes (Signed)
Left sided nonradiating CP that started last night while trying to sleep. Reports tightness in chest. SOB, nausea and diaphoresis present. Denies vomiting. Pt did drive himself to the ED.

## 2020-07-23 NOTE — ED Provider Notes (Signed)
[facility] EMERGENCY DEPT Provider Note   CSN: [id number] Arrival date & time: 07/23/20  1203     History Chief Complaint  Patient presents with  . Chest Pain    Justin [name] is a 65 y.o. male.   Chest Pain Pain location:  L chest Pain quality: tightness   Pain radiates to:  Does not radiate Pain severity:  Moderate Onset quality:  Gradual Duration:  12 hours Timing:  Constant Progression:  Waxing and waning Chronicity:  New Context: at rest   Relieved by:  Nothing Worsened by:  Nothing Ineffective treatments:  None tried Associated symptoms: diaphoresis, nausea and palpitations   Associated symptoms: no back pain, no cough, no fever, no headache, no shortness of breath and no vomiting   Risk factors: diabetes mellitus, high cholesterol, hypertension and male sex        Past Medical History:  Diagnosis Date  . Allergy   . Diabetes mellitus   . ED (erectile dysfunction)    sildenafil 100 mg sparingly  . History of colonic polyps   . Hypertension   . PROSTATITIS, ACUTE 12/02/2009   Qualifier: Diagnosis of  By: Alphonsus Sias MD, Ronnette Hila     Patient Active Problem List   Diagnosis Date Noted  . Dupuytren contracture 11/24/2018  . RBBB 07/14/2018  . Hyperlipidemia associated with type 2 diabetes mellitus (HCC) 02/02/2018  . Family history of prostate cancer 02/02/2018  . ED (erectile dysfunction)   . History of colonic polyps 01/18/2008  . Diabetes mellitus without complication (HCC) 12/23/2006  . Hypertension associated with diabetes (HCC) 12/23/2006  . Allergic rhinitis 11/07/2006    Past Surgical History:  Procedure Laterality Date  . no prior surgery         Family History  Problem Relation Age of Onset  . Dementia Father   . CAD Father        mid 5s, never smoker  . Hypertension Mother   . Stroke Mother        8, first stroke 53. never smoker  . Diabetes Mother        in late life  . Hypertension Sister   . Diabetes  Brother   . Hypertension Brother   . Prostate cancer Brother        24  . Cerebral palsy Daughter   . Cerebral palsy Daughter   . Pneumonia Maternal Grandfather        died from this  . Diabetes Paternal Grandfather   . Colon cancer Neg Hx     Social History   Tobacco Use  . Smoking status: Never Smoker  . Smokeless tobacco: Never Used  Vaping Use  . Vaping Use: Never used  Substance Use Topics  . Alcohol use: Yes    Comment: socially  . Drug use: No    Home Medications Prior to Admission medications   Medication Sig Start Date End Date Taking? Authorizing Provider  acetaminophen (TYLENOL) 500 MG tablet Take 500 mg by mouth every 6 (six) hours as needed.    [provider]  atorvastatin (LIPITOR) 20 MG tablet TAKE 1 TABLET BY MOUTH EVERY DAY 06/14/20   Shelva Majestic, MD  benazepril (LOTENSIN) 20 MG tablet TAKE 1 TABLET BY MOUTH EVERY DAY 06/14/20   Shelva Majestic, MD  cetirizine (ZYRTEC ALLERGY) 10 MG tablet Take 10 mg by mouth daily.    [provider]  glucose blood (ONETOUCH VERIO) test strip Use as instructed 05/04/12   Amador Cunas,  Janett Labella, MD  Multiple Vitamin (MULTIVITAMIN) capsule Take 1 capsule by mouth daily.    [provider]  [facility] DELICA LANCETS FINE MISC 1 each by Does not apply route 2 (two) times daily. 05/04/12   Gordy Savers, MD  SYNJARDY XR 12.08-998 MG TB24 TAKE 1 TABLET BY MOUTH TWICE A DAY 05/19/20   Shelva Majestic, MD  tadalafil (CIALIS) 20 MG tablet Take 1 tablet (20 mg total) by mouth every other day as needed for erectile dysfunction. 11/24/18   Shelva Majestic, MD  TRULICITY 1.5 MG/0.5ML SOPN INJECT 1.5 MG INTO THE SKIN ONCE A WEEK. 05/03/20   Shelva Majestic, MD    Allergies    Patient has no known allergies.  Review of Systems   Review of Systems  Constitutional: Positive for diaphoresis. Negative for chills and fever.  HENT: Negative for congestion and rhinorrhea.   Respiratory: Negative for cough  and shortness of breath.   Cardiovascular: Positive for chest pain and palpitations.  Gastrointestinal: Positive for nausea. Negative for diarrhea and vomiting.  Genitourinary: Negative for difficulty urinating and dysuria.  Musculoskeletal: Negative for arthralgias and back pain.  Skin: Negative for color change and rash.  Neurological: Negative for light-headedness and headaches.    Physical Exam Updated Vital Signs BP (!) 150/62 (BP Location: Right Arm)   Pulse (!) 53   Temp 97.8 F (36.6 C) (Oral)   Resp 16   Ht 6' (1.829 m)   Wt 72.6 kg   SpO2 96%   BMI 21.70 kg/m   Physical Exam Vitals and nursing note reviewed.  Constitutional:      General: He is not in acute distress.    Appearance: Normal appearance.  HENT:     Head: Normocephalic and atraumatic.     Nose: No rhinorrhea.  Eyes:     General:        Right eye: No discharge.        Left eye: No discharge.     Conjunctiva/sclera: Conjunctivae normal.  Cardiovascular:     Rate and Rhythm: Regular rhythm. Bradycardia present.  Pulmonary:     Effort: Pulmonary effort is normal.     Breath sounds: No stridor.  Abdominal:     General: Abdomen is flat. There is no distension.     Palpations: Abdomen is soft.  Musculoskeletal:        General: No deformity or signs of injury.  Skin:    General: Skin is warm and dry.  Neurological:     General: No focal deficit present.     Mental Status: He is alert. Mental status is at baseline.     Motor: No weakness.  Psychiatric:        Mood and Affect: Mood normal.        Behavior: Behavior normal.        Thought Content: Thought content normal.     ED Results / Procedures / Treatments   Labs (all labs ordered are listed, but only abnormal results are displayed) Labs Reviewed  BASIC METABOLIC PANEL - Abnormal; Notable for the following components:      Result Value   Glucose, Bld 215 (*)    All other components within normal limits  RESP PANEL BY RT-PCR (FLU A&B,  COVID) ARPGX2  CBC  D-DIMER, QUANTITATIVE  TROPONIN I (HIGH SENSITIVITY)    EKG None  Radiology No results found.  Procedures Procedures   Medications Ordered in ED Medications  aspirin chewable tablet  324 mg (324 mg Oral Given 07/23/20 1320)    ED Course  I have reviewed the triage vital signs and the nursing notes.  Pertinent labs & imaging results that were available during my care of the patient were reviewed by me and considered in my medical decision making (see chart for details).    MDM Rules/Calculators/A&P                          Patient with history of multiple comorbidities comes in with chest pain.  EKG shows right bundle branch block.  Bradycardic rate.  Review of the chart shows in 2020 he had commentary in his nose where he had a right bundle branch block.  Unable to view other EKGs at this time.  Will give aspirin will get cardiac biomarkers will get chest x-ray and D-dimer.  D-dimer negative troponin negative.  Other screening labs unremarkable.  Patient is high risk chest pain.  While sitting on monitor his heart rate is range anywhere from 50 to over 100.  He says he intermittently feels these as well which is not normal.  I have concerns this patient's chest pain could be high risk and needs further provocative testing.  Will consult medicine for admission.  Final Clinical Impression(s) / ED Diagnoses Final diagnoses:  Chest pain at rest    Rx / DC Orders ED Discharge Orders    None       Sabino Donovan, MD 07/23/20 1346

## 2020-07-24 ENCOUNTER — Encounter (HOSPITAL_COMMUNITY): Payer: Self-pay | Admitting: Internal Medicine

## 2020-07-24 ENCOUNTER — Observation Stay (HOSPITAL_BASED_OUTPATIENT_CLINIC_OR_DEPARTMENT_OTHER): Payer: Medicare PPO

## 2020-07-24 DIAGNOSIS — R079 Chest pain, unspecified: Secondary | ICD-10-CM

## 2020-07-24 DIAGNOSIS — I441 Atrioventricular block, second degree: Secondary | ICD-10-CM

## 2020-07-24 DIAGNOSIS — R0789 Other chest pain: Secondary | ICD-10-CM

## 2020-07-24 DIAGNOSIS — I4430 Unspecified atrioventricular block: Secondary | ICD-10-CM

## 2020-07-24 DIAGNOSIS — R001 Bradycardia, unspecified: Secondary | ICD-10-CM

## 2020-07-24 DIAGNOSIS — Z20822 Contact with and (suspected) exposure to covid-19: Secondary | ICD-10-CM | POA: Diagnosis not present

## 2020-07-24 DIAGNOSIS — E1165 Type 2 diabetes mellitus with hyperglycemia: Secondary | ICD-10-CM | POA: Diagnosis not present

## 2020-07-24 LAB — ECHOCARDIOGRAM COMPLETE
AR max vel: 4.2 cm2
AV Area VTI: 3.7 cm2
AV Area mean vel: 3.59 cm2
AV Mean grad: 4 mmHg
AV Peak grad: 6.9 mmHg
Ao pk vel: 1.31 m/s
Area-P 1/2: 7.99 cm2
Calc EF: 67.4 %
Height: 72 in
MV VTI: 2.79 cm2
P 1/2 time: 518 msec
S' Lateral: 1.8 cm
Single Plane A2C EF: 70.5 %
Single Plane A4C EF: 63.7 %
Weight: 2507.2 oz

## 2020-07-24 LAB — TROPONIN I (HIGH SENSITIVITY): Troponin I (High Sensitivity): 6 ng/L (ref <18)

## 2020-07-24 LAB — TSH: TSH: 1.003 u[IU]/mL (ref 0.350–4.500)

## 2020-07-24 LAB — GLUCOSE, CAPILLARY
Glucose-Capillary: 141 mg/dL — ABNORMAL HIGH (ref 70–99)
Glucose-Capillary: 134 mg/dL — ABNORMAL HIGH (ref 70–99)
Glucose-Capillary: 250 mg/dL — ABNORMAL HIGH (ref 70–99)
Glucose-Capillary: 192 mg/dL — ABNORMAL HIGH (ref 70–99)

## 2020-07-24 MED ORDER — SODIUM CHLORIDE 0.9 % IV SOLN: INTRAVENOUS | Status: DC

## 2020-07-24 NOTE — Consult Note (Addendum)
Cardiology Consultation:   Patient ID: Justin [name] MRN: [id number]; DOB: 07-05-1955  Admit date: 07/23/2020 Date of Consult: 07/24/2020  PCP:  Shelva Majestic, MD   [facility]  Cardiologist:  Little Ishikawa, MD New Advanced Practice Provider:  No care team member to display Electrophysiologist:  None  [phone]    Patient Profile:   Justin [name] is a 65 y.o. male with a hx of DMII, HTN, HLD, who is being seen today for the evaluation of chest pain at the request of Dr Margo Aye.  History of Present Illness:   Justin [name] has never had a cardiac evaluation. He was admitted with chest pain, cardiology asked to evaluated.  He had the pain early Sunday am. It woke him. It was sharp, and felt like multiple electrical charges, lasted about 5 seconds. There was some chest tightness as well, about a 5/10. He had some SOB when he woke later. The SOB finally resolved, but the tightness continued all day. He did not take any meds other than his usual meds. He did not check his sugar.  The chest tightness lasted overnight and throughout the day. Finally, he became concerned and went to [facility] ER. They gave him ASA, the pain improved to a 2/10. It eventually resolved about suppertime.  His ECG is 2:1 heart block. He goes from 2:1 to 1:1.  Most of the time he is in 1:1 conduction. When he is in 2:1 conduction, his HR will drop into the 40s. During the interview, when his HR dropped, he felt dizzy and a little light-headed, but did not feel he would pass out. He could not tell his HR had changed.   Past Medical History:  Diagnosis Date  . Allergy   . Diabetes mellitus   . ED (erectile dysfunction)    sildenafil 100 mg sparingly  . History of colonic polyps   . Hyperlipidemia associated with type 2 diabetes mellitus (HCC)   . Hypertension   . PROSTATITIS, ACUTE 12/02/2009   Qualifier: Diagnosis of  By: Alphonsus Sias MD, Ronnette Hila     Past Surgical  History:  Procedure Laterality Date  . None       Home Medications:  Prior to Admission medications   Medication Sig Start Date End Date Taking? Authorizing Provider  acetaminophen (TYLENOL) 500 MG tablet Take 1,000 mg by mouth every 6 (six) hours as needed for mild pain.   Yes [provider]  atorvastatin (LIPITOR) 20 MG tablet TAKE 1 TABLET BY MOUTH EVERY DAY Patient taking differently: Take 20 mg by mouth daily. 06/14/20  Yes Shelva Majestic, MD  benazepril (LOTENSIN) 20 MG tablet TAKE 1 TABLET BY MOUTH EVERY DAY Patient taking differently: Take 20 mg by mouth daily. 06/14/20  Yes Shelva Majestic, MD  cetirizine (ZYRTEC) 10 MG tablet Take 10 mg by mouth daily as needed for allergies.   Yes [provider]  Multiple Vitamin (MULTIVITAMIN) capsule Take 1 capsule by mouth daily.   Yes [provider]  SYNJARDY XR 12.08-998 MG TB24 TAKE 1 TABLET BY MOUTH TWICE A DAY 05/19/20  Yes Shelva Majestic, MD  tadalafil (CIALIS) 20 MG tablet Take 1 tablet (20 mg total) by mouth every other day as needed for erectile dysfunction. 11/24/18  Yes Shelva Majestic, MD  TRULICITY 1.5 MG/0.5ML SOPN INJECT 1.5 MG INTO THE SKIN ONCE A WEEK. Patient taking differently: Inject 1.5 mg into the skin once a week. Sunday 05/03/20  Yes  Shelva MajesticHunter, Stephen O, MD  glucose blood [facility](ONETOUCH VERIO) test strip Use as instructed 05/04/12   Gordy SaversKwiatkowski, Peter F, MD  [facility] CenterNETOUCH DELICA LANCETS FINE MISC 1 each by Does not apply route 2 (two) times daily. 05/04/12   Gordy SaversKwiatkowski, Peter F, MD    Inpatient Medications: Scheduled Meds: . aspirin EC  81 mg Oral Daily  . atorvastatin  20 mg Oral Daily  . benazepril  40 mg Oral Daily  . insulin aspart  0-15 Units Subcutaneous TID WC  . loratadine  10 mg Oral Daily   Continuous Infusions:  PRN Meds: acetaminophen, ondansetron (ZOFRAN) IV  Allergies:   No Known Allergies  Social History:   Social History   Socioeconomic History  . Marital status:  Married    Spouse name: Not on file  . Number of children: Not on file  . Years of education: Not on file  . Highest education level: Not on file  Occupational History  . Not on file  Tobacco Use  . Smoking status: Never Smoker  . Smokeless tobacco: Never Used  Vaping Use  . Vaping Use: Never used  Substance and Sexual Activity  . Alcohol use: Yes    Comment: socially  . Drug use: No  . Sexual activity: Yes    Comment: wife only  Other Topics Concern  . Not on file  Social History Narrative   Married. Identical twin daughters 8835 in 2019- mirror twins- born 1228 weeks. Both daughters have CP and are wheelchair bound - [place] - graduated top of their classes and have MBA from [employer]. 1 daughters married- they all live together.       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       Hobbies: travel, walks and hikes, skiing       Social Determinants of Health   Financial Resource Strain: Not on file  Food Insecurity: Not on file  Transportation Needs: Not on file  Physical Activity: Not on file  Stress: Not on file  Social Connections: Not on file  Intimate Partner Violence: Not on file    Family History:    Family History  Problem Relation Age of Onset  . Dementia Father   . CAD Father        mid 5470s, never smoker  . Hypertension Mother   . Stroke Mother        5984, first stroke 5169. never smoker  . Diabetes Mother        in late life  . Hypertension Sister   . Diabetes Brother   . Hypertension Brother   . Prostate cancer Brother        6466  . Cerebral palsy Daughter   . Cerebral palsy Daughter   . Pneumonia Maternal Grandfather        died from this  . Diabetes Paternal Grandfather   . Colon cancer Neg Hx     Family Status  Relation Name Status  . Father  Deceased  . Mother  Deceased  . Sister  Alive  . Brother  Alive  . Daughter  Alive  . Daughter  Alive  . MGF  Deceased  . PGF  Deceased  . Neg Hx  (Not Specified)      ROS:  Please see the history of present illness.  All other ROS reviewed and negative.     Physical Exam/Data:   Vitals:   07/23/20 1538 07/23/20 2226 07/24/20 0600 07/24/20 0831  BP:  Marland Kitchen(!)  144/101 133/85 124/78  Pulse:  93  89  Resp:  18 17 16   Temp: 97.8 F (36.6 C) 98.5 F (36.9 C) 97.6 F (36.4 C) 97.8 F (36.6 C)  TempSrc: Oral Oral Oral Oral  SpO2:  96%  98%  Weight:   71.1 kg   Height:        Intake/Output Summary (Last 24 hours) at 07/24/2020 1121 Last data filed at 07/24/2020 0800 Gross per 24 hour  Intake 360 ml  Output 700 ml  Net -340 ml   Last 3 Weights 07/24/2020 07/23/2020 11/01/2019  Weight (lbs) 156 lb 11.2 oz 160 lb 166 lb  Weight (kg) 71.079 kg 72.576 kg 75.297 kg     Body mass index is 21.25 kg/m.  General:  Well nourished, well developed, in no acute distress HEENT: normal Lymph: no adenopathy Neck: no JVD Endocrine:  No thryomegaly Vascular: No carotid bruits; 4/4 extremity pulses 2+  Cardiac:  normal S1, S2; RRR; no murmur  Lungs:  clear to auscultation bilaterally, no wheezing, rhonchi or rales  Abd: soft, nontender, no hepatomegaly  Ext: no edema Musculoskeletal:  No deformities, BUE and BLE strength normal and equal Skin: warm and dry  Neuro:  CNs 2-12 intact, no focal abnormalities noted Psych:  Normal affect   EKG:  The EKG was personally reviewed and demonstrates:  2:1 exit block HR 56, RBBB. Arrhythmia is new, RBBB is old Telemetry:  Telemetry was personally reviewed and demonstrates:  Mostly SR, but has prolonged episodes 2:1 block  Relevant CV Studies:  ECHO: ordered  Laboratory Data:  High Sensitivity Troponin:   Recent Labs  Lab 07/23/20 1220 07/23/20 1423 07/24/20 0522  TROPONINIHS 11 9 6      Chemistry Recent Labs  Lab 07/23/20 1220  NA 137  K 4.3  CL 102  CO2 24  GLUCOSE 215*  BUN 22  CREATININE 0.94  CALCIUM 9.6  GFRNONAA >60  ANIONGAP 11    No results for input(s): PROT, ALBUMIN, AST, ALT,  ALKPHOS, BILITOT in the last 168 hours. Hematology Recent Labs  Lab 07/23/20 1220  WBC 9.6  RBC 5.38  HGB 16.6  HCT 49.4  MCV 91.8  MCH 30.9  MCHC 33.6  RDW 13.1  PLT 286   BNPNo results for input(s): BNP, PROBNP in the last 168 hours.  DDimer  Recent Labs  Lab 07/23/20 1241  DDIMER 0.29   Lab Results  Component Value Date   TSH 1.51 07/29/2017   Lab Results  Component Value Date   HGBA1C 6.9 (H) 07/23/2020     Radiology/Studies:  DG Chest 2 View  Result Date: 07/23/2020 CLINICAL DATA:  Chest tightness, shortness of breath, nausea and diaphoresis. EXAM: CHEST - 2 VIEW COMPARISON:  June 15, 2018 FINDINGS: Cardiomediastinal silhouette is normal. Mediastinal contours appear intact. There is no evidence of focal airspace consolidation, pleural effusion or pneumothorax. Osseous structures are without acute abnormality. Soft tissues are grossly normal. IMPRESSION: No active cardiopulmonary disease. Electronically Signed   By: 09/22/2020 M.D.   On: 07/23/2020 13:54     Assessment and Plan:   1. Chest pain: - ez neg MI, no STEMI by ECG - f/u on echo results  - if echo abnl >> cath -hold off on coronary CTA  2. Mobitz II heart block - unknown duration - no rate-lowering meds - symptomatic - MD advise on EP eval    Risk Assessment/Risk Scores:     HEAR Score (for undifferentiated chest pain):  HEAR  Score: 5        For questions or updates, please contact [facility] Please consult [email or web address] for contact info under    Signed, Theodore Demark, PA-C  07/24/2020 11:21 AM   Patient seen and examined.  Agree with above documentation.  Justin [name] is a 65 year old male with a history of T2DM, hypertension, hyperlipidemia.  Consulted to see for evaluation of chest pain.  He reports that he had an episode of chest pain Sunday morning around 3 AM.  Reports it was sharp pain that he described as feeling like a shocking sensation, lasted for about 5  seconds.  Following this had tightness on the left side of his chest.  Tightness lasted for about 12 hours, ultimately prompting him to go to the ED at [facility].  Initial EKG shows sinus rhythm, 2:1 AV block, rate 56, right bundle branch block, QRS 149 ms, normal PR interval.  Troponins 11 >9 >6.  He was admitted to [facility] and coronary CTA was ordered.  On exam, patient is alert and oriented, regular rate and rhythm, no murmurs, lungs CTAB, no LE edema or JVD.  Telemetry personally reviewed, shows periods of 2-1 AV block with rates in 40s.  Otherwise in normal sinus rhythm with rates 80s to 90s.  Echocardiogram personally reviewed and shows normal biventricular function, no significant valvular disease.  Does report he has been having periods of lightheadedness but denies any syncope.  Reports several episodes of lightheadedness this morning, seems to correlate with bradycardia.    His presentation is concerning for symptomatic bradycardia.  Having periods of 2:1 block with rates in 40s, appears symptomatic during episodes.  Normal PR interval with QRS prolongation ( ), suspect Mobitz II.  Will consult EP for PPM evaluation.  For his chest pain, suspect noncardiac pain as reported over 12 hours of chest pain with negative troponins.  Coronary CTA ordered by primary team, but recommend holding off.  Heart rate is too fast when he is in 1:1 conduction for coronary CTA, and unable to give beta-blockers given his conduction abnormality.  Little Ishikawa, MD

## 2020-07-24 NOTE — Consult Note (Signed)
Cardiology Consultation:   Patient ID: DEMARI GALES MRN: [id number]; DOB: 1956-03-03  Admit date: 07/23/2020 Date of Consult: 07/24/2020  PCP:  Shelva Majestic, MD   [facility]  Cardiologist:  Little Ishikawa, MD    Patient Profile:   Javad Salva Mccaffrey is a 65 y.o. male with a hx of DM, HTN who is being seen today for the evaluation of Mobitz II heart block at the request of D. Bjorn Pippin.  History of Present Illness:   Mr. Starnes sought medical attention for CP.   He was admitted for w/u, initially planned for coronary CT by attending team, though noted today to have been in 2:1 block and cardiology consulted confirming the finding, his CT cancelled and EP consulted with concerns of possible symptomatic bradycardia. In d/w attending cardiologist no reports of hx of syncope, fel a little lightheaded with 2:1 rate.  He saw neurology July 2021 for what was described as a "seizure-like episode" lasting about 25seconds, with some "fogginess" afterwards.  This in the environment of travel, poor sleep and some ETOH consumption.  Planned for EEG (was normal) with recommendation if EEG was normal no further w/u, if recurrent to consider cardiac monitoring  LABS K+ 4.3 BUN/Creat 22/0.94 HS Trop 11, 9, 6 WBC 9.6 H/H 16/49 Plts 286  Echo completed, pending report, Dr. Jerene Pitch was in the room while echo was being done, reports preserved bive function  Home meds reviewed, no potential nodal blocking agents noted  He reports being woken from sleep with sporadic and random electrical type chest pains, these seemed to settle into a tightness in the center of his chest. Eventually as the morning came on, the tightness seemed to be hanging on, as the morning wore on, developed an associated breathless feeling, "like I was hyperventilating" and some lightheadedness and went to the ER.  All in all lasted about 12 hours, none here now  He reports the event  last summer that is discussed above.  Otherwise, no hx of near syncoe or syncope. He will get a little lightheaded standing up from bending over. He walks his dog regularly and feels like he has good exertional capacity and has not noted any particular change/reduction in his exertional capacity in any way.   Past Medical History:  Diagnosis Date  . Allergy   . Diabetes mellitus 2007  . ED (erectile dysfunction)    sildenafil 100 mg sparingly  . History of colonic polyps   . Hyperlipidemia associated with type 2 diabetes mellitus (HCC)   . Hypertension   . PROSTATITIS, ACUTE 12/02/2009   Qualifier: Diagnosis of  By: Alphonsus Sias MD, Ronnette Hila     Past Surgical History:  Procedure Laterality Date  . None       Home Medications:  Prior to Admission medications   Medication Sig Start Date End Date Taking? Authorizing Provider  acetaminophen (TYLENOL) 500 MG tablet Take 1,000 mg by mouth every 6 (six) hours as needed for mild pain.   Yes [provider]  atorvastatin (LIPITOR) 20 MG tablet TAKE 1 TABLET BY MOUTH EVERY DAY Patient taking differently: Take 20 mg by mouth daily. 06/14/20  Yes Shelva Majestic, MD  benazepril (LOTENSIN) 20 MG tablet TAKE 1 TABLET BY MOUTH EVERY DAY Patient taking differently: Take 20 mg by mouth daily. 06/14/20  Yes Shelva Majestic, MD  cetirizine (ZYRTEC) 10 MG tablet Take 10 mg by mouth daily as needed for allergies.   Yes [provider]  Multiple Vitamin (MULTIVITAMIN) capsule Take 1 capsule by mouth daily.   Yes [provider]  SYNJARDY XR 12.08-998 MG TB24 TAKE 1 TABLET BY MOUTH TWICE A DAY 05/19/20  Yes Shelva Majestic, MD  tadalafil (CIALIS) 20 MG tablet Take 1 tablet (20 mg total) by mouth every other day as needed for erectile dysfunction. 11/24/18  Yes Shelva Majestic, MD  TRULICITY 1.5 MG/0.5ML SOPN INJECT 1.5 MG INTO THE SKIN ONCE A WEEK. Patient taking differently: Inject 1.5 mg into the skin once a week. Sunday  05/03/20  Yes Hunter, Stephen O, MD  glucose blood (ONETOUCH VERIO) test strip Use as instructed 05/04/12   Kwiatkowski, Peter F, MD  ONETOUCH DELICA LANCETS FINE MISC 1 each by Does not apply route 2 (two) times daily. 05/04/12   Kwiatkowski, Peter F, MD    Inpatient Medications: Scheduled Meds: . aspirin EC  81 mg Oral Daily  . atorvastatin  20 mg Oral Daily  . benazepril  40 mg Oral Daily  . insulin aspart  0-15 Units Subcutaneous TID WC  . loratadine  10 mg Oral Daily   Continuous Infusions:  PRN Meds: acetaminophen, ondansetron (ZOFRAN) IV  Allergies:   No Known Allergies  Social History:   Social History   Socioeconomic History  . Marital status: Married    Spouse name: Not on file  . Number of children: Not on file  . Years of education: Not on file  . Highest education level: Not on file  Occupational History  . Occupation: Retired  Tobacco Use  . Smoking status: Never Smoker  . Smokeless tobacco: Never Used  Vaping Use  . Vaping Use: Never used  Substance and Sexual Activity  . Alcohol use: Yes    Comment: socially  . Drug use: No  . Sexual activity: Yes    Comment: wife only  Other Topics Concern  . Not on file  Social History Narrative   Married. Identical twin daughters 35 in 2019- mirror twins- born 28 weeks. Both daughters have CP and are wheelchair bound - both live in [place] - graduated top of their classes and have MBA from [employer]. 1 daughters married- they all live together.       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       Hobbies: travel, walks and hikes, skiing       Social Determinants of Health   Financial Resource Strain: Not on file  Food Insecurity: Not on file  Transportation Needs: Not on file  Physical Activity: Not on file  Stress: Not on file  Social Connections: Not on file  Intimate Partner Violence: Not on file    Family History:   Family History  Problem Relation Age of Onset  . Dementia Father   . CAD  Father        CABG mid 70s, never smoker  . Hypertension Mother   . Stroke Mother        84, first stroke 69. never smoker  . Diabetes Mother        in late life  . Hypertension Sister   . Diabetes Brother   . Hypertension Brother   . Prostate cancer Brother        66   . Cerebral palsy Daughter   . Cerebral palsy Daughter   . Pneumonia Maternal Grandfather        died from this  . Diabetes Paternal Grandfather   . Colon cancer Neg Hx  ROS:  Please see the history of present illness.  All other ROS reviewed and negative.     Physical Exam/Data:   Vitals:   07/23/20 1538 07/23/20 2226 07/24/20 0600 07/24/20 0831  BP:  (!) 144/101 133/85 124/78  Pulse:  93  89  Resp:  18 17 16   Temp: 97.8 F (36.6 C) 98.5 F (36.9 C) 97.6 F (36.4 C) 97.8 F (36.6 C)  TempSrc: Oral Oral Oral Oral  SpO2:  96%  98%  Weight:   71.1 kg   Height:        Intake/Output Summary (Last 24 hours) at 07/24/2020 1418 Last data filed at 07/24/2020 0800 Gross per 24 hour  Intake 360 ml  Output 700 ml  Net -340 ml   Last 3 Weights 07/24/2020 07/23/2020 11/01/2019  Weight (lbs) 156 lb 11.2 oz 160 lb 166 lb  Weight (kg) 71.079 kg 72.576 kg 75.297 kg     Body mass index is 21.25 kg/m.  General:  Well nourished, well developed, in no acute distress HEENT: normal Lymph: no adenopathy Neck: no JVD Endocrine:  No thryomegaly Vascular: No carotid bruits Cardiac:  RRR; no murmurs, gallops or rubs Lungs: CTA b/l, no wheezing, rhonchi or rales  Abd: soft, nontender  Ext: no edema Musculoskeletal:  No deformities Skin: warm and dry  Neuro:  no focal abnormalities noted Psych:  Normal affect   EKG:  The EKG was personally reviewed and demonstrates:    #1 2:1 AVblock, V rate 29m RBBB #2 2:1 AVblock, RBBBB #3 2:1 AVBlock, V rate 50, RBBB #4 2:1 AVblock 49bpm, RBBB  OLD 07/14/2018: SR RBBB  Telemetry:  Telemetry was personally reviewed and demonstrates:   Currently 2:1 AVblock  50-52bom Majority of his time here so far has been SR 1:1 generally 80's,  has periods of 1:1 conduction More so today  Relevant CV Studies:  Echo is ordered and pending  Laboratory Data:  High Sensitivity Troponin:   Recent Labs  Lab 07/23/20 1220 07/23/20 1423 07/24/20 0522  TROPONINIHS 11 9 6      Chemistry Recent Labs  Lab 07/23/20 1220  NA 137  K 4.3  CL 102  CO2 24  GLUCOSE 215*  BUN 22  CREATININE 0.94  CALCIUM 9.6  GFRNONAA >60  ANIONGAP 11    No results for input(s): PROT, ALBUMIN, AST, ALT, ALKPHOS, BILITOT in the last 168 hours. Hematology Recent Labs  Lab 07/23/20 1220  WBC 9.6  RBC 5.38  HGB 16.6  HCT 49.4  MCV 91.8  MCH 30.9  MCHC 33.6  RDW 13.1  PLT 286   BNPNo results for input(s): BNP, PROBNP in the last 168 hours.  DDimer  Recent Labs  Lab 07/23/20 1241  DDIMER 0.29     Radiology/Studies:  DG Chest 2 View  Result Date: 07/23/2020 CLINICAL DATA:  Chest tightness, shortness of breath, nausea and diaphoresis. EXAM: CHEST - 2 VIEW COMPARISON:  June 15, 2018 FINDINGS: Cardiomediastinal silhouette is normal. Mediastinal contours appear intact. There is no evidence of focal airspace consolidation, pleural effusion or pneumothorax. Osseous structures are without acute abnormality. Soft tissues are grossly normal. IMPRESSION: No active cardiopulmonary disease. Electronically Signed   By: 09/22/2020 M.D.   On: 07/23/2020 13:54     Assessment and Plan:   1. Intermittent 2:1 AV block, rates high 40's-low 50s     No meds to contribute     TSH ordered  Most of the time here has been 1:1 80's-90's No clear  symptoms clearly of bradycardia  Dr. Lalla BrothersLambert discussed at length his conduction system disease at baseline and now. Discussed potential need for PPM though not clearly indicated at this time. Discussed symptoms of bradycardia, advancing conduction system disease when to return/reach out. Early EP follow up is in progress.  The  pt mentions his wife felt better about some monitoring at home, will defer to the attending cardiology team    Risk Assessment/Risk Scores:  {  For questions or updates, please contact [facility] Please consult [email or web address] for contact info under    Signed, Sheilah PigeonRenee Lynn Glenys Snader, PA-C  07/24/2020 2:18 PM;lt

## 2020-07-24 NOTE — Progress Notes (Signed)
PROGRESS NOTE  Justin [name] ZOX:[id number] DOB: 10/03/1955 DOA: 07/23/2020 PCP: Shelva Majestic, MD  HPI/Recap of past 24 hours: Justin [name] is a 65 y.o. male with medical history significant of HTN, IIDM, HLD, presented with new onset chest pain.  Patient woke up in the middle of night feeling sharp-like chest pain 10/10 retrosternal nonradiating, associated with frequent nausea and sweating.  Whole episode lasted few minutes and then subsided based on no exacerbating or relieving factor.  Patient went home to sleep through the night however he woke up the morning of this presentation with the same type pain lasting for few minutes.  He also started to feel lightheadedness.   ED Course: Heart rate varies from 50s to 150s appears to be sinus.  EKG showed RBBB.  Trop negative x2.  Chest x-ray clear.  Seen by cardiology.  Due to concern for symptomatic advanced heart block EP was consulted.  07/24/20: Seen and examined at his bedside.  He denies having any chest pain at time of this visit.  No dyspnea at rest.  No palpitations.  Concern for morbid type II heart block by cardiology.  TSH ordered by cardiology and is normal.  Assessment/Plan: Active Problems:   Chest pain at rest  Atypical chest pain, ACS has been ruled out. No evidence of acute ischemia on 12 EKG. Troponin negative x2. Seen by cardiology.  Mobitz type II heart block EP consulted by cardiology. TSH normal 1.003 on 07/24/20  Type 2 diabetes with hyperglycemia. A1c 6.9 on 07/23/2020. Serum glucose 215 Start gentle IV fluid hydration Continue insulin sliding scale Continue to hold off oral hypoglycemic  Essential hypertension Continue home oral antihypertensives. On benazepril.  Chronic diastolic CHF 2D echo done on 07/24/2020 revealed LVEF 65 to 70% with grade 1 diastolic dysfunction.  No regional wall motion abnormalities. Cardiology assisting with the management Monitor urine output Strict I's and O's  and daily weight.    Code Status: Full code.  Family Communication: None at bedside.  Disposition Plan: Likely will discharge to home once cardiology and electrophysiology sign off   Consultants:  Cardiology  Electrophysiology  Procedures:  2D echo  Antimicrobials:  Number  DVT prophylaxis: SCDs  Status is: Observation    Dispo:  Patient From: Home  Planned Disposition: Home when cardiology and electrophysiology sign off.  Medically stable for discharge: No, ongoing management of advanced heart block.       Objective: Vitals:   07/23/20 1538 07/23/20 2226 07/24/20 0600 07/24/20 0831  BP:  (!) 144/101 133/85 124/78  Pulse:  93  89  Resp:  Temp: 97.8 F (36.6 C) 98.5 F (36.9 C) 97.6 F (36.4 C) 97.8 F (36.6 C)  TempSrc: Oral Oral Oral Oral  SpO2:  96%  98%  Weight:   71.1 kg   Height:        Intake/Output Summary (Last 24 hours) at 07/24/2020 1650 Last data filed at 07/24/2020 0800 Gross per 24 hour  Intake 360 ml  Output 700 ml  Net -340 ml   Filed Weights   07/23/20 1212 07/24/20 0600  Weight: 72.6 kg 71.1 kg    Exam:  . General: 65 y.o. year-old male well developed well nourished in no acute distress.  Alert and oriented x3. . Cardiovascular: Regular rate and rhythm with no rubs or gallops.  No thyromegaly or JVD noted.   Marland Kitchen Respiratory: Clear to auscultation with no wheezes or rales. Good inspiratory effort. . Abdomen:  Soft nontender nondistended with normal bowel sounds x4 quadrants. . Musculoskeletal: No lower extremity edema. 2/4 pulses in all 4 extremities. . Skin: No ulcerative lesions noted or rashes, . Psychiatry: Mood is appropriate for condition and setting   Data Reviewed: CBC: Recent Labs  Lab 07/23/20 1220  WBC 9.6  HGB 16.6  HCT 49.4  MCV 91.8  PLT 286   Basic Metabolic Panel: Recent Labs  Lab 07/23/20 1220  NA 137  K 4.3  CL 102  CO2 24  GLUCOSE 215*  BUN 22  CREATININE 0.94  CALCIUM 9.6    GFR: Estimated Creatinine Clearance: 78.8 mL/min (by C-G formula based on SCr of 0.94 mg/dL). Liver Function Tests: No results for input(s): AST, ALT, ALKPHOS, BILITOT, PROT, ALBUMIN in the last 168 hours. No results for input(s): LIPASE, AMYLASE in the last 168 hours. No results for input(s): AMMONIA in the last 168 hours. Coagulation Profile: No results for input(s): INR, PROTIME in the last 168 hours. Cardiac Enzymes: No results for input(s): CKTOTAL, CKMB, CKMBINDEX, TROPONINI in the last 168 hours. BNP (last 3 results) No results for input(s): PROBNP in the last 8760 hours. HbA1C: Recent Labs    07/23/20 1657  HGBA1C 6.9*   CBG: Recent Labs  Lab 07/23/20 1635 07/23/20 2217 07/24/20 0750 07/24/20 1154 07/24/20 1637  GLUCAP 97 136* 141* 134* 250*   Lipid Profile: No results for input(s): CHOL, HDL, LDLCALC, TRIG, CHOLHDL, LDLDIRECT in the last 72 hours. Thyroid Function Tests: Recent Labs    07/24/20 1458  TSH 1.003   Anemia Panel: No results for input(s): VITAMINB12, FOLATE, FERRITIN, TIBC, IRON, RETICCTPCT in the last 72 hours. Urine analysis:    Component Value Date/Time   COLORURINE yellow 02/20/2010 0820   APPEARANCEUR Clear 02/20/2010 0820   LABSPEC >=1.030 02/20/2010 0820   PHURINE 5.0 02/20/2010 0820   HGBUR negative 02/20/2010 0820   BILIRUBINUR n 02/21/2015 0852   PROTEINUR n 02/21/2015 0852   UROBILINOGEN 0.2 02/21/2015 0852   UROBILINOGEN 0.2 02/20/2010 0820   NITRITE n 02/21/2015 0852   NITRITE negative 02/20/2010 0820   LEUKOCYTESUR Negative 02/21/2015 0852   Sepsis Labs: @LABRCNTIP (procalcitonin:4,lacticidven:4)  ) Recent Results (from the past 240 hour(s))  Resp Panel by RT-PCR (Flu A&B, Covid) Nasopharyngeal Swab     Status: None   Collection Time: 07/23/20  1:46 PM   Specimen: Nasopharyngeal Swab; Nasopharyngeal(NP) swabs in vial transport medium  Result Value Ref Range Status   SARS Coronavirus 2 by RT PCR NEGATIVE NEGATIVE  Final    Comment: (NOTE) SARS-CoV-2 target nucleic acids are NOT DETECTED.  The SARS-CoV-2 RNA is generally detectable in upper respiratory specimens during the acute phase of infection. The lowest concentration of SARS-CoV-2 viral copies this assay can detect is 138 copies/mL. A negative result does not preclude SARS-Cov-2 infection and should not be used as the sole basis for treatment or other patient management decisions. A negative result may occur with  improper specimen collection/handling, submission of specimen other than nasopharyngeal swab, presence of viral mutation(s) within the areas targeted by this assay, and inadequate number of viral copies(<138 copies/mL). A negative result must be combined with clinical observations, patient history, and epidemiological information. The expected result is Negative.  Fact Sheet for Patients:  09/22/20  Fact Sheet for Healthcare Providers:  BloggerCourse.com  This test is no t yet approved or cleared by the [employer] FDA and  has been authorized for detection and/or diagnosis of SARS-CoV-2 by FDA under an Emergency Use Authorization (  EUA). This EUA will remain  in effect (meaning this test can be used) for the duration of the COVID-19 declaration under Section 564(b)(1) of the Act, 21 U.S.C.section 360bbb-3(b)(1), unless the authorization is terminated  or revoked sooner.       Influenza A by PCR NEGATIVE NEGATIVE Final   Influenza B by PCR NEGATIVE NEGATIVE Final    Comment: (NOTE) The Xpert Xpress SARS-CoV-2/FLU/RSV plus assay is intended as an aid in the diagnosis of influenza from Nasopharyngeal swab specimens and should not be used as a sole basis for treatment. Nasal washings and aspirates are unacceptable for Xpert Xpress SARS-CoV-2/FLU/RSV testing.  Fact Sheet for Patients: BloggerCourse.com  Fact Sheet for Healthcare  Providers: [employer]ious[employer]ker[employer]it  This test is not yet approved or cleared by the [place] FDA and has been authorized for detection and/or diagnosis of SARS-CoV-2 by FDA under an Emergency Use Authorization (EUA). This EUA will remain in effect (meaning this test can be used) for the duration of the COVID-19 declaration under Section 564(b)(1) of the Act, 21 U.S.C. section 360bbb-3(b)(1), unless the authorization is terminated or revoked.  Performed at [facility]       Studies: ECHOCARDIOGRAM COMPLETE  Result Date: 07/24/2020    ECHOCARDIOGRAM REPORT   Patient Name:   Justin [name] Date of Exam: 07/24/2020 Medical Rec #:  [id number]         Height:       72.0 in Accession #:    [id number]        Weight:       156.7 lb Date of Birth:  25-Jan-1956         BSA:          1.921 m Patient Age:    65 years          BP:           133/85 mmHg Patient Gender: M                 HR:           86 bpm. Exam Location:  Inpatient Procedure: 2D Echo, Cardiac Doppler and Color Doppler Indications:    Chest pain  History:        Patient has no prior history of Echocardiogram examinations.                 Risk Factors:Hypertension and Diabetes.  Sonographer:    Neomia Dear RDCS Referring Phys: [id number] Emeline General  Sonographer Comments: No cardiac surgery or procedure history in chart IMPRESSIONS  1. Left ventricular ejection fraction, by estimation, is 65 to 70%. Left ventricular ejection fraction by 2D MOD biplane is 67.4 %. The left ventricle has normal function. The left ventricle has no regional wall motion abnormalities. Left ventricular diastolic parameters are consistent with Grade I diastolic dysfunction (impaired relaxation).  2. Right ventricular systolic function is normal. The right ventricular size is normal. There is normal pulmonary artery systolic pressure. The estimated right ventricular systolic pressure is 20.6 mmHg.  3. The mitral valve is grossly  normal. Trivial mitral valve regurgitation.  4. The aortic valve is tricuspid. Aortic valve regurgitation is trivial. Mild aortic valve sclerosis is present, with no evidence of aortic valve stenosis. Aortic regurgitation PHT measures 518 msec.  5. The inferior vena cava is normal in size with greater than 50% respiratory variability, suggesting right atrial pressure of 3 mmHg. Comparison(s): No prior Echocardiogram. FINDINGS  Left Ventricle: Left ventricular ejection fraction, by  estimation, is 65 to 70%. Left ventricular ejection fraction by 2D MOD biplane is 67.4 %. The left ventricle has normal function. The left ventricle has no regional wall motion abnormalities. The left ventricular internal cavity size was normal in size. There is no left ventricular hypertrophy. Left ventricular diastolic parameters are consistent with Grade I diastolic dysfunction (impaired relaxation). Indeterminate filling pressures. Right Ventricle: The right ventricular size is normal. No increase in right ventricular wall thickness. Right ventricular systolic function is normal. There is normal pulmonary artery systolic pressure. The tricuspid regurgitant velocity is 2.10 m/s, and  with an assumed right atrial pressure of 3 mmHg, the estimated right ventricular systolic pressure is 20.6 mmHg. Left Atrium: Left atrial size was normal in size. Right Atrium: Right atrial size was normal in size. Pericardium: There is no evidence of pericardial effusion. Mitral Valve: The mitral valve is grossly normal. Trivial mitral valve regurgitation. MV peak gradient, 8.8 mmHg. The mean mitral valve gradient is 1.0 mmHg. Tricuspid Valve: The tricuspid valve is grossly normal. Tricuspid valve regurgitation is trivial. Aortic Valve: The aortic valve is tricuspid. Aortic valve regurgitation is trivial. Aortic regurgitation PHT measures 518 msec. Mild aortic valve sclerosis is present, with no evidence of aortic valve stenosis. Aortic valve mean gradient  measures 4.0 mmHg. Aortic valve peak gradient measures 6.9 mmHg. Aortic valve area, by VTI measures 3.70 cm. Pulmonic Valve: The pulmonic valve was normal in structure. Pulmonic valve regurgitation is not visualized. Aorta: The aortic root and ascending aorta are structurally normal, with no evidence of dilitation. Venous: The inferior vena cava is normal in size with greater than 50% respiratory variability, suggesting right atrial pressure of 3 mmHg. IAS/Shunts: No atrial level shunt detected by color flow Doppler.  LEFT VENTRICLE PLAX 2D                        Biplane EF (MOD) LVIDd:         3.70 cm         LV Biplane EF:   Left LVIDs:         1.80 cm                          ventricular LV PW:         1.20 cm                          ejection LV IVS:        1.00 cm                          fraction by LVOT diam:     2.50 cm                          2D MOD LV SV:         124                              biplane is LV SV Index:   65                               67.4 %. LVOT Area:     4.91 cm  Diastology                                LV e' medial:    8.49 cm/s LV Volumes (MOD)               LV E/e' medial:  17.6 LV vol d, MOD    67.2 ml       LV e' lateral:   20.40 cm/s A2C:                           LV E/e' lateral: 7.3 LV vol d, MOD    73.5 ml A4C: LV vol s, MOD    19.8 ml A2C: LV vol s, MOD    26.7 ml A4C: LV SV MOD A2C:   47.4 ml LV SV MOD A4C:   73.5 ml LV SV MOD BP:    47.8 ml LEFT ATRIUM             Index       RIGHT ATRIUM           Index LA diam:        2.90 cm 1.51 cm/m  RA Area:     12.90 cm LA Vol (A2C):   27.5 ml 14.32 ml/m RA Volume:   26.20 ml  13.64 ml/m LA Vol (A4C):   34.1 ml 17.75 ml/m LA Biplane Vol: 33.2 ml 17.29 ml/m  AORTIC VALVE                   PULMONIC VALVE AV Area (Vmax):    4.20 cm    PV Vmax:       1.03 m/s AV Area (Vmean):   3.59 cm    PV Vmean:      66.000 cm/s AV Area (VTI):     3.70 cm    PV VTI:        0.229 m AV Vmax:           131.00  cm/s PV Peak grad:  4.2 mmHg AV Vmean:          98.000 cm/s PV Mean grad:  2.0 mmHg AV VTI:            0.336 m AV Peak Grad:      6.9 mmHg AV Mean Grad:      4.0 mmHg LVOT Vmax:         112.00 cm/s LVOT Vmean:        71.600 cm/s LVOT VTI:          0.253 m LVOT/AV VTI ratio: 0.75 AI PHT:            518 msec  AORTA Ao Root diam: 3.40 cm Ao Asc diam:  3.10 cm MITRAL VALVE                TRICUSPID VALVE MV Area (PHT): 7.99 cm     TR Peak grad:   17.6 mmHg MV Area VTI:   2.79 cm     TR Vmax:        210.00 cm/s MV Peak grad:  8.8 mmHg MV Mean grad:  1.0 mmHg     SHUNTS MV Vmax:       1.48 m/s     Systemic VTI:  0.25 m MV Vmean:      51.2 cm/s    Systemic Diam: 2.50 cm MV Decel  Time: 95 msec MV E velocity: 149.00 cm/s MV A velocity: 70.40 cm/s MV E/A ratio:  2.12 Zoila ShutterKenneth Hilty MD Electronically signed by Zoila ShutterKenneth Hilty MD Signature Date/Time: 07/24/2020/3:27:34 PM    Final     Scheduled Meds: . aspirin EC  81 mg Oral Daily  . atorvastatin  20 mg Oral Daily  . benazepril  40 mg Oral Daily  . insulin aspart  0-15 Units Subcutaneous TID WC  . loratadine  10 mg Oral Daily    Continuous Infusions:   LOS: 0 days     Darlin Droparole N Taliana Mersereau, MD [facility] Pager [phone]  If 7PM-7AM, please contact night-coverage [email or web address] Password [email or web address] 07/24/2020, 4:50 PM

## 2020-07-24 NOTE — Plan of Care (Signed)
  Problem: Education: Goal: Knowledge of General Education information will improve Description: Including pain rating scale, medication(s)/side effects and non-pharmacologic comfort measures Outcome: Progressing   Problem: Health Behavior/Discharge Planning: Goal: Ability to manage health-related needs will improve Outcome: Progressing   Problem: Clinical Measurements: Goal: Ability to maintain clinical measurements within normal limits will improve Outcome: Progressing Goal: Will remain free from infection Outcome: Progressing Goal: Diagnostic test results will improve Outcome: Progressing Goal: Cardiovascular complication will be avoided Outcome: Progressing   Problem: Elimination: Goal: Will not experience complications related to bowel motility Outcome: Progressing   Problem: Pain Managment: Goal: General experience of comfort will improve Outcome: Progressing   Problem: Safety: Goal: Ability to remain free from injury will improve Outcome: Progressing   Problem: Skin Integrity: Goal: Risk for impaired skin integrity will decrease Outcome: Progressing   Problem: Education: Goal: Understanding of cardiac disease, CV risk reduction, and recovery process will improve Outcome: Progressing Goal: Individualized Educational Video(s) Outcome: Progressing   Problem: Activity: Goal: Ability to tolerate increased activity will improve Outcome: Progressing   Problem: Cardiac: Goal: Ability to achieve and maintain adequate cardiovascular perfusion will improve Outcome: Progressing   Problem: Health Behavior/Discharge Planning: Goal: Ability to safely manage health-related needs after discharge will improve Outcome: Progressing

## 2020-07-25 ENCOUNTER — Other Ambulatory Visit: Payer: Self-pay | Admitting: Physician Assistant

## 2020-07-25 ENCOUNTER — Ambulatory Visit (INDEPENDENT_AMBULATORY_CARE_PROVIDER_SITE_OTHER): Payer: Medicare PPO

## 2020-07-25 DIAGNOSIS — I441 Atrioventricular block, second degree: Secondary | ICD-10-CM

## 2020-07-25 DIAGNOSIS — R079 Chest pain, unspecified: Secondary | ICD-10-CM | POA: Diagnosis not present

## 2020-07-25 LAB — BASIC METABOLIC PANEL
Anion gap: 8 (ref 5–15)
BUN: 22 mg/dL (ref 8–23)
CO2: 24 mmol/L (ref 22–32)
Calcium: 8.7 mg/dL — ABNORMAL LOW (ref 8.9–10.3)
Chloride: 107 mmol/L (ref 98–111)
Creatinine, Ser: 0.86 mg/dL (ref 0.61–1.24)
GFR, Estimated: >60 mL/min (ref >60)
Glucose, Bld: 132 mg/dL — ABNORMAL HIGH (ref 70–99)
Potassium: 3.9 mmol/L (ref 3.5–5.1)
Sodium: 139 mmol/L (ref 135–145)

## 2020-07-25 LAB — CBC
HCT: 46.2 % (ref 39.0–52.0)
Hemoglobin: 15.6 g/dL (ref 13.0–17.0)
MCH: 31.1 pg (ref 26.0–34.0)
MCHC: 33.8 g/dL (ref 30.0–36.0)
MCV: 92 fL (ref 80.0–100.0)
Platelets: 238 10*3/uL (ref 150–400)
RBC: 5.02 MIL/uL (ref 4.22–5.81)
RDW: 13.2 % (ref 11.5–15.5)
WBC: 6.6 10*3/uL (ref 4.0–10.5)
nRBC: 0 % (ref 0.0–0.2)

## 2020-07-25 LAB — MAGNESIUM: Magnesium: 2.1 mg/dL (ref 1.7–2.4)

## 2020-07-25 LAB — GLUCOSE, CAPILLARY: Glucose-Capillary: 139 mg/dL — ABNORMAL HIGH (ref 70–99)

## 2020-07-25 MED ORDER — BENAZEPRIL HCL 40 MG PO TABS
40.0000 mg | ORAL_TABLET | Freq: Every day | ORAL | 0 refills | Status: DC
Start: 2020-07-25 — End: 2020-11-10

## 2020-07-25 MED ORDER — ASPIRIN 81 MG PO TBEC: 81.0000 mg | DELAYED_RELEASE_TABLET | Freq: Every day | ORAL | 0 refills | Status: DC

## 2020-07-25 NOTE — Progress Notes (Addendum)
Progress Note  Patient Name: Justin PeeksJohn R [name] Date of Encounter: 07/25/2020  [facility] Cardiologist: Little Ishikawahristopher L Deandrae Wajda, MD   Subjective   Got a little light-headed at times overnight, no presyncope. Used to snore heavily, now sleeps on his side. Wife told him he quit breathing at times during the night, but that was when he snored. No more chest pain.  Inpatient Medications    Scheduled Meds: . aspirin EC  81 mg Oral Daily  . atorvastatin  20 mg Oral Daily  . benazepril  40 mg Oral Daily  . insulin aspart  0-15 Units Subcutaneous TID WC  . loratadine  10 mg Oral Daily   Continuous Infusions: . sodium chloride 50 mL/hr at 07/24/20 1749   PRN Meds: acetaminophen, ondansetron (ZOFRAN) IV   Vital Signs    Vitals:   07/24/20 0600 07/24/20 0831 07/24/20 2007 07/25/20 0614  BP: 133/85 124/78 132/75 120/72  Pulse:  89 98 81  Resp: 17 16 18    Temp: 97.6 F (36.4 C) 97.8 F (36.6 C) 97.8 F (36.6 C) 97.6 F (36.4 C)  TempSrc: Oral Oral Oral Oral  SpO2:  98% 95% 99%  Weight: 71.1 kg   71.5 kg  Height:        Intake/Output Summary (Last 24 hours) at 07/25/2020 0749 Last data filed at 07/25/2020 0518 Gross per 24 hour  Intake 1245 ml  Output 750 ml  Net 495 ml   Last 3 Weights 07/25/2020 07/24/2020 07/23/2020  Weight (lbs) 157 lb 9.6 oz 156 lb 11.2 oz 160 lb  Weight (kg) 71.487 kg 71.079 kg 72.576 kg      Telemetry    SR, 2:1 heart block at times - Personally Reviewed  ECG    None today - Personally Reviewed  Physical Exam   GEN: No acute distress.   Neck: No JVD Cardiac: RRR, no murmurs, rubs, or gallops.  Respiratory: Clear to auscultation bilaterally. GI: Soft, nontender, non-distended  MS: No edema; No deformity. Neuro:  Nonfocal  Psych: Normal affect   Labs    High Sensitivity Troponin:   Recent Labs  Lab 07/23/20 1220 07/23/20 1423 07/24/20 0522  TROPONINIHS 11 9 6       Chemistry Recent Labs  Lab 07/23/20 1220 07/25/20 0404   NA 137 139  K 4.3 3.9  CL 102 107  CO2 24 24  GLUCOSE 215* 132*  BUN 22 22  CREATININE 0.94 0.86  CALCIUM 9.6 8.7*  GFRNONAA >60 >60  ANIONGAP 11 8     Hematology Recent Labs  Lab 07/23/20 1220 07/25/20 0404  WBC 9.6 6.6  RBC 5.38 5.02  HGB 16.6 15.6  HCT 49.4 46.2  MCV 91.8 92.0  MCH 30.9 31.1  MCHC 33.6 33.8  RDW 13.1 13.2  PLT 286 238    BNPNo results for input(s): BNP, PROBNP in the last 168 hours.   DDimer  Recent Labs  Lab 07/23/20 1241  DDIMER 0.29     Radiology    DG Chest 2 View  Result Date: 07/23/2020 CLINICAL DATA:  Chest tightness, shortness of breath, nausea and diaphoresis. EXAM: CHEST - 2 VIEW COMPARISON:  June 15, 2018 FINDINGS: Cardiomediastinal silhouette is normal. Mediastinal contours appear intact. There is no evidence of focal airspace consolidation, pleural effusion or pneumothorax. Osseous structures are without acute abnormality. Soft tissues are grossly normal. IMPRESSION: No active cardiopulmonary disease. Electronically Signed   By: Ted Mcalpineobrinka  Dimitrova M.D.   On: 07/23/2020 13:54   ECHOCARDIOGRAM COMPLETE  Result Date: 07/24/2020    ECHOCARDIOGRAM REPORT   Patient Name:   Justin [name] Date of Exam: 07/24/2020 Medical Rec #:  [id number]         Height:       72.0 in Accession #:    [id number]        Weight:       156.7 lb Date of Birth:  June 09, 1955         BSA:          1.921 m Patient Age:    65 years          BP:           133/85 mmHg Patient Gender: M                 HR:           86 bpm. Exam Location:  Inpatient Procedure: 2D Echo, Cardiac Doppler and Color Doppler Indications:    Chest pain  History:        Patient has no prior history of Echocardiogram examinations.                 Risk Factors:Hypertension and Diabetes.  Sonographer:    Neomia Dear RDCS Referring Phys: [id number] Emeline General  Sonographer Comments: No cardiac surgery or procedure history in chart IMPRESSIONS  1. Left ventricular ejection fraction, by estimation, is  65 to 70%. Left ventricular ejection fraction by 2D MOD biplane is 67.4 %. The left ventricle has normal function. The left ventricle has no regional wall motion abnormalities. Left ventricular diastolic parameters are consistent with Grade I diastolic dysfunction (impaired relaxation).  2. Right ventricular systolic function is normal. The right ventricular size is normal. There is normal pulmonary artery systolic pressure. The estimated right ventricular systolic pressure is 20.6 mmHg.  3. The mitral valve is grossly normal. Trivial mitral valve regurgitation.  4. The aortic valve is tricuspid. Aortic valve regurgitation is trivial. Mild aortic valve sclerosis is present, with no evidence of aortic valve stenosis. Aortic regurgitation PHT measures 518 msec.  5. The inferior vena cava is normal in size with greater than 50% respiratory variability, suggesting right atrial pressure of 3 mmHg. Comparison(s): No prior Echocardiogram. FINDINGS  Left Ventricle: Left ventricular ejection fraction, by estimation, is 65 to 70%. Left ventricular ejection fraction by 2D MOD biplane is 67.4 %. The left ventricle has normal function. The left ventricle has no regional wall motion abnormalities. The left ventricular internal cavity size was normal in size. There is no left ventricular hypertrophy. Left ventricular diastolic parameters are consistent with Grade I diastolic dysfunction (impaired relaxation). Indeterminate filling pressures. Right Ventricle: The right ventricular size is normal. No increase in right ventricular wall thickness. Right ventricular systolic function is normal. There is normal pulmonary artery systolic pressure. The tricuspid regurgitant velocity is 2.10 m/s, and  with an assumed right atrial pressure of 3 mmHg, the estimated right ventricular systolic pressure is 20.6 mmHg. Left Atrium: Left atrial size was normal in size. Right Atrium: Right atrial size was normal in size. Pericardium: There is no  evidence of pericardial effusion. Mitral Valve: The mitral valve is grossly normal. Trivial mitral valve regurgitation. MV peak gradient, 8.8 mmHg. The mean mitral valve gradient is 1.0 mmHg. Tricuspid Valve: The tricuspid valve is grossly normal. Tricuspid valve regurgitation is trivial. Aortic Valve: The aortic valve is tricuspid. Aortic valve regurgitation is trivial. Aortic regurgitation PHT measures 518 msec. Mild aortic valve sclerosis is present,  with no evidence of aortic valve stenosis. Aortic valve mean gradient measures 4.0 mmHg. Aortic valve peak gradient measures 6.9 mmHg. Aortic valve area, by VTI measures 3.70 cm. Pulmonic Valve: The pulmonic valve was normal in structure. Pulmonic valve regurgitation is not visualized. Aorta: The aortic root and ascending aorta are structurally normal, with no evidence of dilitation. Venous: The inferior vena cava is normal in size with greater than 50% respiratory variability, suggesting right atrial pressure of 3 mmHg. IAS/Shunts: No atrial level shunt detected by color flow Doppler.  LEFT VENTRICLE PLAX 2D                        Biplane EF (MOD) LVIDd:         3.70 cm         LV Biplane EF:   Left LVIDs:         1.80 cm                          ventricular LV PW:         1.20 cm                          ejection LV IVS:        1.00 cm                          fraction by LVOT diam:     2.50 cm                          2D MOD LV SV:         124                              biplane is LV SV Index:   65                               67.4 %. LVOT Area:     4.91 cm                                Diastology                                LV e' medial:    8.49 cm/s LV Volumes (MOD)               LV E/e' medial:  17.6 LV vol d, MOD    67.2 ml       LV e' lateral:   20.40 cm/s A2C:                           LV E/e' lateral: 7.3 LV vol d, MOD    73.5 ml A4C: LV vol s, MOD    19.8 ml A2C: LV vol s, MOD    26.7 ml A4C: LV SV MOD A2C:   47.4 ml LV SV MOD A4C:   73.5 ml LV SV  MOD BP:    47.8 ml LEFT ATRIUM             Index  RIGHT ATRIUM           Index LA diam:        2.90 cm 1.51 cm/m  RA Area:     12.90 cm LA Vol (A2C):   27.5 ml 14.32 ml/m RA Volume:   26.20 ml  13.64 ml/m LA Vol (A4C):   34.1 ml 17.75 ml/m LA Biplane Vol: 33.2 ml 17.29 ml/m  AORTIC VALVE                   PULMONIC VALVE AV Area (Vmax):    4.20 cm    PV Vmax:       1.03 m/s AV Area (Vmean):   3.59 cm    PV Vmean:      66.000 cm/s AV Area (VTI):     3.70 cm    PV VTI:        0.229 m AV Vmax:           131.00 cm/s PV Peak grad:  4.2 mmHg AV Vmean:          98.000 cm/s PV Mean grad:  2.0 mmHg AV VTI:            0.336 m AV Peak Grad:      6.9 mmHg AV Mean Grad:      4.0 mmHg LVOT Vmax:         112.00 cm/s LVOT Vmean:        71.600 cm/s LVOT VTI:          0.253 m LVOT/AV VTI ratio: 0.75 AI PHT:            518 msec  AORTA Ao Root diam: 3.40 cm Ao Asc diam:  3.10 cm MITRAL VALVE                TRICUSPID VALVE MV Area (PHT): 7.99 cm     TR Peak grad:   17.6 mmHg MV Area VTI:   2.79 cm     TR Vmax:        210.00 cm/s MV Peak grad:  8.8 mmHg MV Mean grad:  1.0 mmHg     SHUNTS MV Vmax:       1.48 m/s     Systemic VTI:  0.25 m MV Vmean:      51.2 cm/s    Systemic Diam: 2.50 cm MV Decel Time: 95 msec MV E velocity: 149.00 cm/s MV A velocity: 70.40 cm/s MV E/A ratio:  2.12 Zoila Shutter MD Electronically signed by Zoila Shutter MD Signature Date/Time: 07/24/2020/3:27:34 PM    Final     Cardiac Studies   ECHO: 07/24/2020 1. Left ventricular ejection fraction, by estimation, is 65 to 70%. Left  ventricular ejection fraction by 2D MOD biplane is 67.4 %. The left  ventricle has normal function. The left ventricle has no regional wall  motion abnormalities. Left ventricular  diastolic parameters are consistent with Grade I diastolic dysfunction  (impaired relaxation).  2. Right ventricular systolic function is normal. The right ventricular  size is normal. There is normal pulmonary artery systolic  pressure. The  estimated right ventricular systolic pressure is 20.6 mmHg.  3. The mitral valve is grossly normal. Trivial mitral valve  regurgitation.  4. The aortic valve is tricuspid. Aortic valve regurgitation is trivial.  Mild aortic valve sclerosis is present, with no evidence of aortic valve  stenosis. Aortic regurgitation PHT measures 518 msec.  5. The inferior vena cava is normal in size with greater than  50%  respiratory variability, suggesting right atrial pressure of 3 mmHg.   Patient Profile     65 y.o. male w/ hx  DMII, HTN, HLD, who was admitted 04/11 w/ chest pain, noted to have 2:1 heart block.  Assessment & Plan    1. Chest pain - ez neg MI - EF nl on echo w/ no WMA - Continuous chest pain with negative troponins, no ischemia evaluation planned  2. 2:1 Heart block - symptomatic at times, but no presyncope or syncope - no longer snores since he sleeps on his back - no rate-lowering meds - EP has seen, plans a heart monitor at d/c > ordered  - encouraged him to get a watch that can track his HR +/- rhythm   For questions or updates, please contact [facility] Please consult [email or web address] for contact info under        Signed, Theodore Demark, PA-C  07/25/2020, 7:49 AM     Patient was discharged before I could see him on 4/12.  Little Ishikawa, MD

## 2020-07-25 NOTE — Progress Notes (Signed)
Zio patch placed onto patient.  All instructions and information reviewed with patient, they verbalize understanding with no questions. 

## 2020-07-25 NOTE — Discharge Instructions (Signed)
Chest Wall Pain Chest wall pain is pain in or around the bones and muscles of your chest. Chest wall pain may be caused by:  An injury.  Coughing a lot.  Using your chest and arm muscles too much. Sometimes, the cause may not be known. This pain may take a few weeks or longer to get better. Follow these instructions at home: Managing pain, stiffness, and swelling If told, put ice on the painful area:  Put ice in a plastic bag.  Place a towel between your skin and the bag.  Leave the ice on for 20 minutes, 2-3 times a day.   Activity  Rest as told by your doctor.  Avoid doing things that cause pain. This includes lifting heavy items.  Ask your doctor what activities are safe for you. General instructions  Take over-the-counter and prescription medicines only as told by your doctor.  Do not use any products that contain nicotine or tobacco, such as cigarettes, e-cigarettes, and chewing tobacco. If you need help quitting, ask your doctor.  Keep all follow-up visits as told by your doctor. This is important.   Contact a doctor if:  You have a fever.  Your chest pain gets worse.  You have new symptoms. Get help right away if:  You feel sick to your stomach (nauseous) or you throw up (vomit).  You feel sweaty or light-headed.  You have a cough with mucus from your lungs (sputum) or you cough up blood.  You are short of breath. These symptoms may be an emergency. Do not wait to see if the symptoms will go away. Get medical help right away. Call your local emergency services (911 in the [place] Do not drive yourself to the hospital. Summary  Chest wall pain is pain in or around the bones and muscles of your chest.  It may be treated with ice, rest, and medicines. Your condition may also get better if you avoid doing things that cause pain.  Contact a doctor if you have a fever, chest pain that gets worse, or new symptoms.  Get help right away if you feel light-headed  or you get short of breath. These symptoms may be an emergency. This information is not intended to replace advice given to you by your health care provider. Make sure you discuss any questions you have with your health care provider. Document Revised: 10/02/2017 Document Reviewed: 10/02/2017 Elsevier Patient Education  2021 [employer]  Second-Degree Atrioventricular Block Second-degree atrioventricular (AV) block is a condition that can cause the heart to miss beats. In this condition, the electrical signals that travel from the heart's upper chambers (atria) to its lower chambers (ventricles) move too slowly or are interrupted. There are two types of second-degree AV block:  Mobitz type 1 block. This type does not cause symptoms. It rarely requires treatment.  Mobitz type 2 block. This type is more serious. It results in more missed beats and a very irregular heartbeat. It can lead to third-degree AV block, or complete heart block, meaning that no signal exists between the atria and ventricles. Mobitz type 2 is a dangerous condition that can lead to fainting or cardiac arrest. Mobitz type 2 block often requires a pacemaker. What are the causes? This condition may be caused by:  Any condition that damages the electrical pathway that controls the heart's rate and rhythm, such as a heart attack or infection of the heart.  Overstimulation of the nerve that slows down the heart rate (vagus nerve).  This cause is common among well-conditioned athletes.  Some medicines that slow down the heart rate, such as beta blockers or calcium channel blockers.  Surgery that damages the heart. Some people are born with this condition (congenital heart block), but most people develop it over time. What increases the risk? The risk for this condition increases with age. You are also more likely to develop this condition if you have:  A history of heart attack.  Heart failure.  Coronary heart  disease.  Inflammation of heart muscle (myocarditis).  Disease of heart muscle (cardiomyopathy).  Infection of the heart valves (endocarditis).  Infections or diseases that affect the heart. These include: ? Lyme disease. ? Sarcoidosis. ? Hemochromatosis. ? Rheumatic fever. ? Certain muscle disorders. Babies are more likely to be born with heart block if:  The baby's mother has an autoimmune disease, such as lupus.  The baby is born with a heart defect that affects the heart's structure.  A parent was born with a heart defect. What are the signs or symptoms? Symptoms of this condition include:  Tiredness.  Shortness of breath.  Dizziness.  Light-headedness.  Fainting.  Chest pain. How is this diagnosed? This condition may be diagnosed based on:  A physical exam.  Your medical history.  A measurement of your pulse or heartbeat.  Tests. These may include: ? An electrocardiogram (ECG). This checks for problems with electrical activity in your heart. ? Ambulatory cardiac monitoring. This is a portable ECG that you wear. It checks your heart's rhythm. ? An electrophysiology (EP) study. Long, thin tubes (catheters) are placed in your heart. The catheters give information about your heart's electrical signals. How is this treated? Treatment for this condition depends on the type of block you have and how severe it is. Treatment may involve:  Treating an underlying condition, such as heart disease.  Changing or stopping any heart medicines that may have caused heart block.  Having a permanent pacemaker placed in your chest. A pacemaker uses electrical pulses to help the heart beat normally. It is usually placed under the skin on your chest. You will likely need a pacemaker if you have Mobitz type 2 block. Follow these instructions at home: Alcohol use  Do not drink alcohol if: ? Your health care provider tells you not to drink. ? You are pregnant, may be pregnant,  or are planning to become pregnant.  If you drink alcohol: ? Limit how much you use to:  0-1 drink a day for women.  0-2 drinks a day for men. ? Be aware of how much alcohol is in your drink. In the [place] one drink equals one 12 oz bottle of beer (355 mL), one 5 oz glass of wine (148 mL), or one 1 oz glass of hard liquor (44 mL). General instructions  Take over-the-counter and prescription medicines only as told by your health care provider.  Follow your health care provider's recommendations to help reduce your risk of heart disease. These may include: ? Exercising for at least 30 minutes on 5 or more days each week (150 minutes). Ask your health care provider what type of exercise is safe for you. ? Eating a heart-healthy diet with fruits and vegetables, whole grains, low-fat dairy products, and lean proteins like poultry and eggs. Your health care provider or dietitian can help you make healthy choices. ? Maintaining a healthy weight.  Do not use any products that contain nicotine or tobacco, such as cigarettes, e-cigarettes. and chewing tobacco. If  you need help quitting, ask your health care provider.  Keep all follow-up visits as told by your health care provider. This is important.   Where to find more information  American Heart Association: www.heart.org  National Heart, Lung, and Blood Institute: PopSteam.is Contact a health care provider if you:  Feel like your heart is skipping beats.  Feel more tired than normal.  Have swelling in your lower legs or your feet. Get help right away if you:  Have symptoms that change or get worse.  Develop new symptoms.  Have chest pain, especially if the pain: ? Feels like crushing or pressure. ? Spreads to your arms, back, neck, or jaw.  Feel short of breath.  Feel light-headed or weak.  Faint. These symptoms may represent a serious problem that is an emergency. Do not wait to see if the symptoms will go away. Get  medical help right away. Call your local emergency services (911 in the [place] Do not drive yourself to the hospital. Summary  Second-degree atrioventricular (AV) block is a type of heart block that can cause the heart to miss beats. In this condition, the electrical signals that control heart rate move too slowly or are interrupted.  You will likely need a pacemaker if you have the more serious type of AV block (Mobitz type 2 block).  Get help right away if you have symptoms that change or get worse, have chest pain, feel short of breath or light-headed or weak, faint, or develop new symptoms. This information is not intended to replace advice given to you by your health care provider. Make sure you discuss any questions you have with your health care provider. Document Revised: 02/08/2019 Document Reviewed: 02/08/2019 Elsevier Patient Education  2021 [employer].

## 2020-07-25 NOTE — Discharge Summary (Signed)
Discharge Summary  Justin [name] QMV:[id number] DOB: 07-18-1955  PCP: Shelva Majestic, MD  Admit date: 07/23/2020 Discharge date: 07/25/2020  Time spent: 35 minutes   Recommendations for Outpatient Follow-up:  1. Follow-up with electrophysiology within a week. 2. Follow-up with cardiology within a week. 3. Follow-up with your primary care provider in 1 to 2 weeks. 4. Take your medications as prescribed.  Discharge Diagnoses:  Active Hospital Problems   Diagnosis Date Noted  . Chest pain at rest 07/23/2020    Resolved Hospital Problems  No resolved problems to display.    Discharge Condition: Stable.  Diet recommendation: Resume previous diet.  Heart healthy, carb modified diet.  Vitals:   07/25/20 0614 07/25/20 0900  BP: 120/72 (!) 151/77  Pulse: 81 81  Resp:    Temp: 97.6 F (36.4 C)   SpO2: 99% 95%    History of present illness:  Justin Stooksbury Heinbockelis a 65 y.o.malewith medical history significant ofHTN, IIDM, HLD,presented with new onset chest pain.  Patient woke up in the middle of night feeling sharp-like chest pain 10/10 retrosternal nonradiating, associated with frequent nausea and sweating. Whole episode lasted few minutes and then subsided based on no exacerbating or relieving factor. Patient went home to sleep through the night however he woke up the morning of this presentation with the same type pain lasting for few minutes. He also started to feel lightheadedness.   ED Course:Heart rate varies from 50s to 150s appears to be sinus. EKG showed RBBB. Trop negative x2. Chest x-ray clear.  Seen by cardiology.  Due to concern for symptomatic advanced heart block EP was consulted.  07/25/20: Seen and examined at his bedside.  He denies having any chest pain at time of this visit.  No dyspnea at rest.  No palpitations.  Concern for morbid type II heart block by cardiology.  TSH ordered by cardiology and is normal.   Hospital Course:  Active  Problems:   Chest pain at rest  Resolved atypical chest pain, ACS has been ruled out. No evidence of acute ischemia on 12 EKG. Troponin negative x2. Seen by cardiology, no evidence of wall motion abnormalities on 2D echo done on 07/24/2020.  It revealed LVEF 65 to 70% with grade 1 diastolic dysfunction. Follow-up with cardiology within a week.  Mobitz type II heart block Per cardiology his EKG is 2:1 heart block.  He goes from 2:1 to 1:1.  Most of the time he is in 1:1 conduction.  When he is in 2:1 conduction his heart rate will drop into the 40s.  During cardiologist interview his heart rate dropped and he felt dizzy and a little lightheaded but did not feel he will pass out.  He could not tell his heart rate had changed per cardiology on 07/24/2020. EP consulted by cardiology. TSH normal 1.003 on 07/24/20 Seen by EP, Zio patch placed on the patient on 07/25/2020.  'All instructions and information were reviewed with the patient, they verbalized understanding with no questions' per Pritchett, laverne. Follow-up with electrophysiology within a week.  Type 2 diabetes with hyperglycemia. A1c 6.9 on 07/23/2020. Serum glucose 215 Received gentle IV fluid hydration and insulin sliding scale. Resume home regimen and follow-up with your primary care provider in 1 to 2 weeks.  Essential hypertension Home benazepril dose was increased to 40 mg daily due to uncontrolled hypertension. BP is improved with the increased dose. Follow-up with your PCP in 1 to 2 weeks.  Chronic diastolic CHF 2D echo done on  07/24/2020 revealed LVEF 65 to 70% with grade 1 diastolic dysfunction.  No regional wall motion abnormalities. Follow-up with cardiology outpatient.  Hyperlipidemia Continue home Lipitor. Follow-up with your PCP.   Code Status: Full code.    Consultants:  Cardiology  Electrophysiology  Procedures:  2D echo done on 07/24/2020.    Discharge Exam: BP (!) 151/77   Pulse 81    Temp 97.6 F (36.4 C) (Oral)   Resp 18   Ht 6' (1.829 m)   Wt 71.5 kg   SpO2 95%   BMI 21.37 kg/m  . General: 65 y.o. year-old male well developed well nourished in no acute distress.  Alert and oriented x3. . Cardiovascular: Regular rate and rhythm with no rubs or gallops.  No thyromegaly or JVD noted.   Marland Kitchen Respiratory: Clear to auscultation with no wheezes or rales. Good inspiratory effort. . Abdomen: Soft nontender nondistended with normal bowel sounds x4 quadrants. . Musculoskeletal: No lower extremity edema bilaterally.  . Skin: No ulcerative lesions noted or rashes. . Psychiatry: Mood is appropriate for condition and setting  Discharge Instructions You were cared for by a hospitalist during your hospital stay. If you have any questions about your discharge medications or the care you received while you were in the hospital after you are discharged, you can call the unit and asked to speak with the hospitalist on call if the hospitalist that took care of you is not available. Once you are discharged, your primary care physician will handle any further medical issues. Please note that NO REFILLS for any discharge medications will be authorized once you are discharged, as it is imperative that you return to your primary care physician (or establish a relationship with a primary care physician if you do not have one) for your aftercare needs so that they can reassess your need for medications and monitor your lab values.   Allergies as of 07/25/2020   No Known Allergies     Medication List    TAKE these medications   acetaminophen 500 MG tablet Commonly known as: TYLENOL Take 1,000 mg by mouth every 6 (six) hours as needed for mild pain.   aspirin 81 MG EC tablet Take 1 tablet (81 mg total) by mouth daily. Swallow whole.   atorvastatin 20 MG tablet Commonly known as: LIPITOR TAKE 1 TABLET BY MOUTH EVERY DAY   benazepril 40 MG tablet Commonly known as: LOTENSIN Take 1 tablet  (40 mg total) by mouth daily. What changed:   medication strength  how much to take   cetirizine 10 MG tablet Commonly known as: ZYRTEC Take 10 mg by mouth daily as needed for allergies.   glucose blood test strip Commonly known as: OneTouch Verio Use as instructed   multivitamin capsule Take 1 capsule by mouth daily.   OneTouch Delica Lancets Fine Misc 1 each by Does not apply route 2 (two) times daily.   Synjardy XR 12.08-998 MG Tb24 Generic drug: Empagliflozin-metFORMIN HCl ER TAKE 1 TABLET BY MOUTH TWICE A DAY   tadalafil 20 MG tablet Commonly known as: CIALIS Take 1 tablet (20 mg total) by mouth every other day as needed for erectile dysfunction.   Trulicity 1.5 MG/0.5ML Sopn Generic drug: Dulaglutide INJECT 1.5 MG INTO THE SKIN ONCE A WEEK. What changed: additional instructions      No Known Allergies  Follow-up Information    Shelva Majestic, MD. Call in 1 day(s).   Specialty: Family Medicine Why: Please call for a post hospital  follow-up appointment. Contact information: [address] [place] [address] [phone]        Little Ishikawa, MD .   Specialties: Cardiology, Radiology Contact information: [address] [place] [address] [phone]        Lanier Prude, MD. Call in 1 day(s).   Specialties: Cardiology, Radiology Why: Please call for a post hospital follow-up appointment. Contact information: [address] [place] [address] [phone]                The results of significant diagnostics from this hospitalization (including imaging, microbiology, ancillary and laboratory) are listed below for reference.    Significant Diagnostic Studies: DG Chest 2 View  Result Date: 07/23/2020 CLINICAL DATA:  Chest tightness, shortness of breath, nausea and diaphoresis. EXAM: CHEST - 2 VIEW COMPARISON:  June 15, 2018 FINDINGS: Cardiomediastinal silhouette is normal.  Mediastinal contours appear intact. There is no evidence of focal airspace consolidation, pleural effusion or pneumothorax. Osseous structures are without acute abnormality. Soft tissues are grossly normal. IMPRESSION: No active cardiopulmonary disease. Electronically Signed   By: Ted Mcalpine M.D.   On: 07/23/2020 13:54   ECHOCARDIOGRAM COMPLETE  Result Date: 07/24/2020    ECHOCARDIOGRAM REPORT   Patient Name:   ARHUM PEEPLES Date of Exam: 07/24/2020 Medical Rec #:  [id number]         Height:       72.0 in Accession #:    [id number]        Weight:       156.7 lb Date of Birth:  09-30-1955         BSA:          1.921 m Patient Age:    65 years          BP:           133/85 mmHg Patient Gender: M                 HR:           86 bpm. Exam Location:  Inpatient Procedure: 2D Echo, Cardiac Doppler and Color Doppler Indications:    Chest pain  History:        Patient has no prior history of Echocardiogram examinations.                 Risk Factors:Hypertension and Diabetes.  Sonographer:    Neomia Dear RDCS Referring Phys: [id number] Emeline General  Sonographer Comments: No cardiac surgery or procedure history in chart IMPRESSIONS  1. Left ventricular ejection fraction, by estimation, is 65 to 70%. Left ventricular ejection fraction by 2D MOD biplane is 67.4 %. The left ventricle has normal function. The left ventricle has no regional wall motion abnormalities. Left ventricular diastolic parameters are consistent with Grade I diastolic dysfunction (impaired relaxation).  2. Right ventricular systolic function is normal. The right ventricular size is normal. There is normal pulmonary artery systolic pressure. The estimated right ventricular systolic pressure is 20.6 mmHg.  3. The mitral valve is grossly normal. Trivial mitral valve regurgitation.  4. The aortic valve is tricuspid. Aortic valve regurgitation is trivial. Mild aortic valve sclerosis is present, with no evidence of aortic valve stenosis. Aortic  regurgitation PHT measures 518 msec.  5. The inferior vena cava is normal in size with greater than 50% respiratory variability, suggesting right atrial pressure of 3 mmHg. Comparison(s): No prior Echocardiogram. FINDINGS  Left Ventricle: Left ventricular ejection fraction, by  estimation, is 65 to 70%. Left ventricular ejection fraction by 2D MOD biplane is 67.4 %. The left ventricle has normal function. The left ventricle has no regional wall motion abnormalities. The left ventricular internal cavity size was normal in size. There is no left ventricular hypertrophy. Left ventricular diastolic parameters are consistent with Grade I diastolic dysfunction (impaired relaxation). Indeterminate filling pressures. Right Ventricle: The right ventricular size is normal. No increase in right ventricular wall thickness. Right ventricular systolic function is normal. There is normal pulmonary artery systolic pressure. The tricuspid regurgitant velocity is 2.10 m/s, and  with an assumed right atrial pressure of 3 mmHg, the estimated right ventricular systolic pressure is 20.6 mmHg. Left Atrium: Left atrial size was normal in size. Right Atrium: Right atrial size was normal in size. Pericardium: There is no evidence of pericardial effusion. Mitral Valve: The mitral valve is grossly normal. Trivial mitral valve regurgitation. MV peak gradient, 8.8 mmHg. The mean mitral valve gradient is 1.0 mmHg. Tricuspid Valve: The tricuspid valve is grossly normal. Tricuspid valve regurgitation is trivial. Aortic Valve: The aortic valve is tricuspid. Aortic valve regurgitation is trivial. Aortic regurgitation PHT measures 518 msec. Mild aortic valve sclerosis is present, with no evidence of aortic valve stenosis. Aortic valve mean gradient measures 4.0 mmHg. Aortic valve peak gradient measures 6.9 mmHg. Aortic valve area, by VTI measures 3.70 cm. Pulmonic Valve: The pulmonic valve was normal in structure. Pulmonic valve regurgitation is not  visualized. Aorta: The aortic root and ascending aorta are structurally normal, with no evidence of dilitation. Venous: The inferior vena cava is normal in size with greater than 50% respiratory variability, suggesting right atrial pressure of 3 mmHg. IAS/Shunts: No atrial level shunt detected by color flow Doppler.  LEFT VENTRICLE PLAX 2D                        Biplane EF (MOD) LVIDd:         3.70 cm         LV Biplane EF:   Left LVIDs:         1.80 cm                          ventricular LV PW:         1.20 cm                          ejection LV IVS:        1.00 cm                          fraction by LVOT diam:     2.50 cm                          2D MOD LV SV:         124                              biplane is LV SV Index:   65                               67.4 %. LVOT Area:     4.91 cm  Diastology                                LV e' medial:    8.49 cm/s LV Volumes (MOD)               LV E/e' medial:  17.6 LV vol d, MOD    67.2 ml       LV e' lateral:   20.40 cm/s A2C:                           LV E/e' lateral: 7.3 LV vol d, MOD    73.5 ml A4C: LV vol s, MOD    19.8 ml A2C: LV vol s, MOD    26.7 ml A4C: LV SV MOD A2C:   47.4 ml LV SV MOD A4C:   73.5 ml LV SV MOD BP:    47.8 ml LEFT ATRIUM             Index       RIGHT ATRIUM           Index LA diam:        2.90 cm 1.51 cm/m  RA Area:     12.90 cm LA Vol (A2C):   27.5 ml 14.32 ml/m RA Volume:   26.20 ml  13.64 ml/m LA Vol (A4C):   34.1 ml 17.75 ml/m LA Biplane Vol: 33.2 ml 17.29 ml/m  AORTIC VALVE                   PULMONIC VALVE AV Area (Vmax):    4.20 cm    PV Vmax:       1.03 m/s AV Area (Vmean):   3.59 cm    PV Vmean:      66.000 cm/s AV Area (VTI):     3.70 cm    PV VTI:        0.229 m AV Vmax:           131.00 cm/s PV Peak grad:  4.2 mmHg AV Vmean:          98.000 cm/s PV Mean grad:  2.0 mmHg AV VTI:            0.336 m AV Peak Grad:      6.9 mmHg AV Mean Grad:      4.0 mmHg LVOT Vmax:         112.00 cm/s LVOT  Vmean:        71.600 cm/s LVOT VTI:          0.253 m LVOT/AV VTI ratio: 0.75 AI PHT:            518 msec  AORTA Ao Root diam: 3.40 cm Ao Asc diam:  3.10 cm MITRAL VALVE                TRICUSPID VALVE MV Area (PHT): 7.99 cm     TR Peak grad:   17.6 mmHg MV Area VTI:   2.79 cm     TR Vmax:        210.00 cm/s MV Peak grad:  8.8 mmHg MV Mean grad:  1.0 mmHg     SHUNTS MV Vmax:       1.48 m/s     Systemic VTI:  0.25 m MV Vmean:      51.2 cm/s    Systemic Diam: 2.50 cm MV Decel  Time: 95 msec MV E velocity: 149.00 cm/s MV A velocity: 70.40 cm/s MV E/A ratio:  2.12 Zoila ShutterKenneth Hilty MD Electronically signed by Zoila ShutterKenneth Hilty MD Signature Date/Time: 07/24/2020/3:27:34 PM    Final     Microbiology: Recent Results (from the past 240 hour(s))  Resp Panel by RT-PCR (Flu A&B, Covid) Nasopharyngeal Swab     Status: None   Collection Time: 07/23/20  1:46 PM   Specimen: Nasopharyngeal Swab; Nasopharyngeal(NP) swabs in vial transport medium  Result Value Ref Range Status   SARS Coronavirus 2 by RT PCR NEGATIVE NEGATIVE Final    Comment: (NOTE) SARS-CoV-2 target nucleic acids are NOT DETECTED.  The SARS-CoV-2 RNA is generally detectable in upper respiratory specimens during the acute phase of infection. The lowest concentration of SARS-CoV-2 viral copies this assay can detect is 138 copies/mL. A negative result does not preclude SARS-Cov-2 infection and should not be used as the sole basis for treatment or other patient management decisions. A negative result may occur with  improper specimen collection/handling, submission of specimen other than nasopharyngeal swab, presence of viral mutation(s) within the areas targeted by this assay, and inadequate number of viral copies(<138 copies/mL). A negative result must be combined with clinical observations, patient history, and epidemiological information. The expected result is Negative.  Fact Sheet for Patients:  BloggerCourse.comhttps://www.fda.gov/media/152166/download  Fact  Sheet for Healthcare Providers:  SeriousBroker.ithttps://www.fda.gov/media/152162/download  This test is no t yet approved or cleared by the [place] FDA and  has been authorized for detection and/or diagnosis of SARS-CoV-2 by FDA under an Emergency Use Authorization (EUA). This EUA will remain  in effect (meaning this test can be used) for the duration of the COVID-19 declaration under Section 564(b)(1) of the Act, 21 U.S.C.section 360bbb-3(b)(1), unless the authorization is terminated  or revoked sooner.       Influenza A by PCR NEGATIVE NEGATIVE Final   Influenza B by PCR NEGATIVE NEGATIVE Final    Comment: (NOTE) The Xpert Xpress SARS-CoV-2/FLU/RSV plus assay is intended as an aid in the diagnosis of influenza from Nasopharyngeal swab specimens and should not be used as a sole basis for treatment. Nasal washings and aspirates are unacceptable for Xpert Xpress SARS-CoV-2/FLU/RSV testing.  Fact Sheet for Patients: BloggerCourse.comhttps://www.fda.gov/media/152166/download  Fact Sheet for Healthcare Providers: SeriousBroker.ithttps://www.fda.gov/media/152162/download  This test is not yet approved or cleared by the [place] FDA and has been authorized for detection and/or diagnosis of SARS-CoV-2 by FDA under an Emergency Use Authorization (EUA). This EUA will remain in effect (meaning this test can be used) for the duration of the COVID-19 declaration under Section 564(b)(1) of the Act, 21 U.S.C. section 360bbb-3(b)(1), unless the authorization is terminated or revoked.  Performed at [facility]      Labs: Basic Metabolic Panel: Recent Labs  Lab 07/23/20 1220 07/25/20 0404  NA 137 139  K 4.3 3.9  CL 102 107  CO2 24 24  GLUCOSE 215* 132*  BUN 22 22  CREATININE 0.94 0.86  CALCIUM 9.6 8.7*  MG  --  2.1   Liver Function Tests: No results for input(s): AST, ALT, ALKPHOS, BILITOT, PROT, ALBUMIN in the last 168 hours. No results for input(s): LIPASE, AMYLASE in the last 168 hours. No  results for input(s): AMMONIA in the last 168 hours. CBC: Recent Labs  Lab 07/23/20 1220 07/25/20 0404  WBC 9.6 6.6  HGB 16.6 15.6  HCT 49.4 46.2  MCV 91.8 92.0  PLT 286 238   Cardiac Enzymes: No results for input(s): CKTOTAL,  CKMB, CKMBINDEX, TROPONINI in the last 168 hours. BNP: BNP (last 3 results) No results for input(s): BNP in the last 8760 hours.  ProBNP (last 3 results) No results for input(s): PROBNP in the last 8760 hours.  CBG: Recent Labs  Lab 07/24/20 0750 07/24/20 1154 07/24/20 1637 07/24/20 2152 07/25/20 0758  GLUCAP 141* 134* 250* 192* 139*       Signed:  Darlin Drop, MD [facility] 07/25/2020, 5:39 PM

## 2020-07-26 ENCOUNTER — Telehealth: Payer: Self-pay

## 2020-07-26 NOTE — Telephone Encounter (Signed)
2:20 PM on the 22nd ideally

## 2020-07-26 NOTE — Telephone Encounter (Signed)
Patient is scheduled   

## 2020-07-26 NOTE — Telephone Encounter (Signed)
Patient is calling in stating that he is needing a hospital follow up, only two same days next week. Please advise of where we can work patient in.

## 2020-07-26 NOTE — Telephone Encounter (Signed)
Ok to use SDA. 

## 2020-08-01 ENCOUNTER — Ambulatory Visit: Payer: Medicare PPO | Admitting: Cardiology

## 2020-08-01 ENCOUNTER — Other Ambulatory Visit: Payer: Self-pay

## 2020-08-01 ENCOUNTER — Encounter: Payer: Self-pay | Admitting: Cardiology

## 2020-08-01 ENCOUNTER — Telehealth: Payer: Self-pay | Admitting: Internal Medicine

## 2020-08-01 VITALS — BP 120/60 | HR 92 | Ht 72.0 in | Wt 163.0 lb

## 2020-08-01 DIAGNOSIS — I441 Atrioventricular block, second degree: Secondary | ICD-10-CM

## 2020-08-01 NOTE — Progress Notes (Signed)
Electrophysiology Office Follow up Visit Note:    Date:  08/01/2020   ID:  Justin [name], DOB 1956-01-26, MRN [id number]  PCP:  Marin Olp, MD  [facility] Cardiologist:  Donato Heinz, MD  [place] Electrophysiologist:  Vickie Epley, MD    Interval History:    Justin [name] is a 65 y.o. male who presents for a follow up visit.  I met the patient on July 24, 2020 when he was admitted with atypical sounding chest pain.  His high-sensitivity troponins were negative twice.  He was incidentally noted to have 2-1 AV block.  We were consulted to weigh in about this AV conduction disease.  He was completely asymptomatic with this 2-1.  We recommended discharge with a monitor with plans to follow-up to reassess for any symptoms.  Since discharge, the patient reports doing well.  He has been active without any lightheadedness or dizziness.  He actually went out and bought an apple watch to monitor his heart rhythms and reports that his heart rates have been all greater than 60 bpm.  When he exerts himself his heart rate goes up into the 120s.  No syncope or presyncope.   Past Medical History:  Diagnosis Date  . Allergy   . Diabetes mellitus 2007  . ED (erectile dysfunction)    sildenafil 100 mg sparingly  . History of colonic polyps   . Hyperlipidemia associated with type 2 diabetes mellitus ([place])   . Hypertension   . PROSTATITIS, ACUTE 12/02/2009   Qualifier: Diagnosis of  By: Silvio Pate MD, [facility]     Past Surgical History:  Procedure Laterality Date  . None      Current Medications: Current Meds  Medication Sig  . acetaminophen (TYLENOL) 500 MG tablet Take 1,000 mg by mouth every 6 (six) hours as needed for mild pain.  Marland Kitchen aspirin EC 81 MG EC tablet Take 1 tablet (81 mg total) by mouth daily. Swallow whole.  Marland Kitchen atorvastatin (LIPITOR) 20 MG tablet TAKE 1 TABLET BY MOUTH EVERY DAY  . benazepril (LOTENSIN) 40 MG tablet Take 1 tablet (40 mg total) by  mouth daily.  . cetirizine (ZYRTEC) 10 MG tablet Take 10 mg by mouth daily as needed for allergies.  Marland Kitchen glucose blood (ONETOUCH VERIO) test strip Use as instructed  . Multiple Vitamin (MULTIVITAMIN) capsule Take 1 capsule by mouth daily.  Glory Rosebush DELICA LANCETS FINE MISC 1 each by Does not apply route 2 (two) times daily.  Marland Kitchen SYNJARDY XR 12.08-998 MG TB24 TAKE 1 TABLET BY MOUTH TWICE A DAY  . tadalafil (CIALIS) 20 MG tablet Take 1 tablet (20 mg total) by mouth every other day as needed for erectile dysfunction.  . TRULICITY 1.5 XT/0.6YI SOPN INJECT 1.5 MG INTO THE SKIN ONCE A WEEK.     Allergies:   Patient has no known allergies.   Social History   Socioeconomic History  . Marital status: Married    Spouse name: Not on file  . Number of children: Not on file  . Years of education: Not on file  . Highest education level: Not on file  Occupational History  . Occupation: Retired  Tobacco Use  . Smoking status: Never Smoker  . Smokeless tobacco: Never Used  Vaping Use  . Vaping Use: Never used  Substance and Sexual Activity  . Alcohol use: Yes    Comment: socially  . Drug use: No  . Sexual activity: Yes    Comment: wife only  Other Topics Concern  . Not on file  Social History Narrative   Married. Identical twin daughters 58 in 2019- mirror twins- born 57 weeks. Both daughters have CP and are wheelchair bound - both live in [place] - graduated top of their classes and have MBA from [employer]. 1 daughters married- they all live together.       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       Hobbies: travel, walks and hikes, skiing       Social Determinants of Health   Financial Resource Strain: Not on file  Food Insecurity: Not on file  Transportation Needs: Not on file  Physical Activity: Not on file  Stress: Not on file  Social Connections: Not on file     Family History: The patient's family history includes CAD in his father; Cerebral palsy in his  daughter and daughter; Dementia in his father; Diabetes in his brother, mother, and paternal grandfather; Hypertension in his brother, mother, and sister; Pneumonia in his maternal grandfather; Prostate cancer in his brother; Stroke in his mother. There is no history of Colon cancer.  ROS:   Please see the history of present illness.    All other systems reviewed and are negative.  EKGs/Labs/Other Studies Reviewed:    The following studies were reviewed today:  July 24, 2020 echo personally reviewed Left ventricular function normal, 65% Right ventricular function normal No significant valvular abnormalities  EKG:  The ekg ordered today demonstrates sinus rhythm with right bundle branch block  Recent Labs: 07/24/2020: TSH 1.003 07/25/2020: BUN 22; Creatinine, Ser 0.86; Hemoglobin 15.6; Magnesium 2.1; Platelets 238; Potassium 3.9; Sodium 139  Recent Lipid Panel    Component Value Date/Time   CHOL 128 11/24/2018 1125   TRIG 64.0 11/24/2018 1125   HDL 54.00 11/24/2018 1125   CHOLHDL 2 11/24/2018 1125   VLDL 12.8 11/24/2018 1125   LDLCALC 61 11/24/2018 1125   LDLDIRECT 52.0 07/14/2018 1425    Physical Exam:    VS:  BP 120/60 (BP Location: Left Arm, Patient Position: Sitting, Cuff Size: Normal)   Pulse 92   Ht 6' (1.829 m)   Wt 163 lb (73.9 kg)   SpO2 98%   BMI 22.11 kg/m     Wt Readings from Last 3 Encounters:  08/01/20 163 lb (73.9 kg)  07/25/20 157 lb 9.6 oz (71.5 kg)  11/01/19 166 lb (75.3 kg)     GEN:  Well nourished, well developed in no acute distress HEENT: Normal NECK: No JVD; No carotid bruits LYMPHATICS: No lymphadenopathy CARDIAC: RRR, no murmurs, rubs, gallops RESPIRATORY:  Clear to auscultation without rales, wheezing or rhonchi  ABDOMEN: Soft, non-tender, non-distended MUSCULOSKELETAL:  No edema; No deformity  SKIN: Warm and dry NEUROLOGIC:  Alert and oriented x 3 PSYCHIATRIC:  Normal affect   ASSESSMENT:    1. Heart block AV second degree     PLAN:    In order of problems listed above:  1. 2-1 AV block Now conducting one-to-one.  No evidence of significant bradycardia on the patient's heart rate watch which she has been using since discharge.  At this time, without syncope or presyncope, would prefer to continue with conservative, watchful waiting approach.  We discussed indications for permanent pacing during today's visit including presyncope or syncope or decreased exercise tolerance.  He will let us know if he experiences any of these which would prompt a more urgent evaluation and possible implant of a permanent pacemaker.  I will plan  to see him back in 6 months or sooner as needed.    Medication Adjustments/Labs and Tests Ordered: Current medicines are reviewed at length with the patient today.  Concerns regarding medicines are outlined above.  Orders Placed This Encounter  Procedures  . EKG 12-Lead   No orders of the defined types were placed in this encounter.    Signed, Lars Mage, MD, [facility] 08/01/2020 1:51 PM    Electrophysiology [facility]

## 2020-08-01 NOTE — Telephone Encounter (Signed)
Notified by [employer] regarding abnormal rhythm.  Strip reviewed, associated with patient triggered event. Rhythm is 2:1 AVB with normal PR, QRS ~ 120 ms, low ventricular rate 52 bpm. This rhythm persists for the entire 90s recording.  Contacted the patient. He states he had felt a "wave" like a pulsation in his ears and HR on his Apple watch was down to about 48-50 so he triggered the monitor. He stood up right after this with HR still reading low on the watch and felt fine without dizziness. His HR is currently back up to the 80s and he has no complaints.   On review of his chart, this rhythm was previously observed during a recent admission for evaluation for atypical chest pain. His current strip appears to be unchanged from the ECG obtained during the hospitalization, intervals are also stable. He states he had the same sensation in his ears while hospitalized when HR dropped low but otherwise was asymptomatic as well. As he did not have any overt symptoms with this, the plan was for monitoring and conservative management at that time. No offending medications on review of his list. Given no interval change from the rhythm and symptomatology during his hospitalization, and since conduction seems to have now recovered, we will hold off on sending him to the ED. I will notify Dr. Lalla Brothers and also requested he call us tomorrow during the day to discuss any potential changes in management with Dr. Lalla Brothers. We also discussed that he not drive in the interim and need for ED evaluation in the event of any significant symptoms such as syncope, near syncope, significant dyspnea, confusion, severe weakness, particularly in association with bradycardia, or for HR < 40. He was in agreement and expressed understanding of the plan.

## 2020-08-01 NOTE — Patient Instructions (Addendum)
Medication Instructions:  Your physician recommends that you continue on your current medications as directed. Please refer to the Current Medication list given to you today. *If you need a refill on your cardiac medications before your next appointment, please call your pharmacy*  Lab Work: None ordered. If you have labs (blood work) drawn today and your tests are completely normal, you will receive your results only by: Marland Kitchen MyChart Message (if you have MyChart) OR . A paper copy in the mail If you have any lab test that is abnormal or we need to change your treatment, we will call you to review the results.  Testing/Procedures: None ordered.  Follow-Up: At [facility], you and your health needs are our priority.  As part of our continuing mission to provide you with exceptional heart care, we have created designated Provider Care Teams.  These Care Teams include your primary Cardiologist (physician) and Advanced Practice Providers (APPs -  Physician Assistants and Nurse Practitioners) who all work together to provide you with the care you need, when you need it.  Your next appointment:   Your physician wants you to follow-up in: 6 MONTHS with Dr. Lalla Brothers.   You will receive a reminder letter in the mail two months in advance. If you don't receive a letter, please call our office to schedule the follow-up appointment.

## 2020-08-02 ENCOUNTER — Telehealth: Payer: Self-pay | Admitting: Cardiology

## 2020-08-02 NOTE — Telephone Encounter (Signed)
Patient states he has a Holter monitor his HR went to 47 last night around 9:45 pm. He states it was recorded by the [employer] and he received a call from a Dr. Okey Dupre.

## 2020-08-02 NOTE — Telephone Encounter (Signed)
Mychart message sent to Pt.  Advised no action needed.  Continue to monitor.

## 2020-08-03 ENCOUNTER — Telehealth: Payer: Self-pay | Admitting: Cardiology

## 2020-08-03 NOTE — Patient Instructions (Addendum)
Health Maintenance Due  Topic Date Due  . OPHTHALMOLOGY EXAM Will sign release form at checkout  01/07/2020  . PNA vac Low Risk Adult - discuss at physical 06/27/2020   Continue current meds. Keep cardiology updated and keep next appointment- I am more in the camp for pacemaker if it can get covered by insurance  Recommended follow up: keep physical in may

## 2020-08-03 NOTE — Progress Notes (Signed)
Phone [phone]   Subjective:  Justin [name] is a 65 y.o. year old very pleasant male patient who presents for  hospital follow up for Chest Pain and found to have second-degree type II AV block   See problem oriented charting as well  Past Medical History-  Patient Active Problem List   Diagnosis Date Noted  . Mobitz type 2 second degree AV block 08/04/2020    Priority: High  . Diabetes mellitus without complication ([place]) 15/40/0867    Priority: High  . Hyperlipidemia associated with type 2 diabetes mellitus ([place]) 02/02/2018    Priority: Medium  . Family history of prostate cancer 02/02/2018    Priority: Medium  . Hypertension associated with diabetes ([place]) 12/23/2006    Priority: Medium  . Dupuytren contracture 11/24/2018    Priority: Low  . ED (erectile dysfunction)     Priority: Low  . History of colonic polyps 01/18/2008    Priority: Low  . Allergic rhinitis 11/07/2006    Priority: Low  . Chest pain at rest 07/23/2020  . RBBB 07/14/2018    Medications- reviewed and updated  A medical reconciliation was performed comparing current medicines to hospital discharge medications. Current Outpatient Medications  Medication Sig Dispense Refill  . acetaminophen (TYLENOL) 500 MG tablet Take 1,000 mg by mouth every 6 (six) hours as needed for mild pain.    Marland Kitchen aspirin EC 81 MG EC tablet Take 1 tablet (81 mg total) by mouth daily. Swallow whole. 360 tablet 0  . atorvastatin (LIPITOR) 20 MG tablet TAKE 1 TABLET BY MOUTH EVERY DAY 90 tablet 3  . benazepril (LOTENSIN) 40 MG tablet Take 1 tablet (40 mg total) by mouth daily. 90 tablet 0  . blood glucose meter kit and supplies KIT Dispense based on patient and insurance preference. Use up to four times daily as directed. 1 each 3  . cetirizine (ZYRTEC) 10 MG tablet Take 10 mg by mouth daily as needed for allergies.    . Multiple Vitamin (MULTIVITAMIN) capsule Take 1 capsule by mouth daily.    Marland Kitchen SYNJARDY XR 12.08-998 MG TB24 TAKE  1 TABLET BY MOUTH TWICE A DAY 180 tablet 3  . tadalafil (CIALIS) 20 MG tablet Take 1 tablet (20 mg total) by mouth every other day as needed for erectile dysfunction. 10 tablet 11  . TRULICITY 1.5 YP/9.5KD SOPN INJECT 1.5 MG INTO THE SKIN ONCE A WEEK. 1.5 mL 3  . glucose blood (ONETOUCH VERIO) test strip Use as instructed (Patient not taking: Reported on 08/04/2020) 100 each 12  . ONETOUCH DELICA LANCETS FINE MISC 1 each by Does not apply route 2 (two) times daily. (Patient not taking: Reported on 08/04/2020) 100 each 3   No current facility-administered medications for this visit.   Objective  Objective:  BP 138/74   Pulse 88   Temp 98.3 F (36.8 C) (Temporal)   Ht 6' (1.829 m)   Wt 163 lb 6.4 oz (74.1 kg)   SpO2 98%   BMI 22.16 kg/m  Gen: NAD, resting comfortably CV: RRR no murmurs rubs or gallops Lungs: CTAB no crackles, wheeze, rhonchi Abdomen: soft/nontender/nondistended/normal bowel sounds.  Ext: no edema Skin: warm, dry   Assessment and Plan:   #social update- [place] going [place] in [place] and pushing their daughters who are wheelchair-bound- some stress with the bradycardia episodes and this upcoming event  #Atypical chest pain with likely incidental finding of Mobitz type II heart block S:started out 2 sundays ago with electrical like signals  in his chest.  In last few days he has had several episodes where he feels some dizziness or pulsation in both his ears. No syncopal episodes. Got as low as 39 yesterday but not persistent on I watch and as high as 164. Mostly when HR dropping is in 87s or 50s. Has had multiple calls from [employer] for his cardiac monitor showing recurrent 2:1 AV blcok  Today high has been 107 and low of 46. Did experience some lightheadedness before coming in today ut that hs resolved A/P: Unclear original etiology for patient's chest pain-cardiology has not thought this was ischemic in etiology-troponins were negative.  Echocardiogram reassuring other  than grade 1 diastolic dysfunction.  In regards to Mobitz type II heart block- patient's ongoing symptoms when he becomes bradycardic or concerning to me-I told him to follow-up closely with cardiology for their opinion on potential pacemaker.  He knows if he had a syncopal episode to seek care immediately or if he has progressive symptoms     # Diabetes S: Medication:Synjardy 12.08-998 mg-takes 1 tablet twice daily, Trulicity 1.5 mg weekly CBGs- denies any lows Lab Results  Component Value Date   HGBA1C 6.9 (H) 07/23/2020   HGBA1C 6.1 (A) 10/28/2019   HGBA1C 6.0 (A) 05/27/2019   A/P: Stable. Continue current medications.  - with his episodes encouraged to check CBGs but strongly doubt these are related   #hypertension S: medication: Benazepril 40 mg Home readings #s: some 130s some 140s BP Readings from Last 3 Encounters:  08/04/20 138/74  08/01/20 120/60  07/25/20 (!) 151/77  A/P: controlled in office- continue current meds   #hyperlipidemia S: Medication: Atorvastatin 20 mg Lab Results  Component Value Date   CHOL 128 11/24/2018   HDL 54.00 11/24/2018   LDLCALC 61 11/24/2018   LDLDIRECT 52.0 07/14/2018   TRIG 64.0 11/24/2018   CHOLHDL 2 11/24/2018   A/P: hopefully remains controlled- update lipid panel next time we do labs   #Heart failure was listed by hospitalist- the patient is not dependent on diuretics nor has been in the past.  65% EF with grade 1 diastolic dysfunction.  I disagree with this diagnosis- would not list unless cardiology lists it   Recommended follow up: keep may physical Future Appointments  Date Time Provider [place]  08/16/2020  4:20 PM Donato Heinz, MD [facility]  09/12/2020  1:00 PM Marin Olp, MD [facility]    Lab/Order associations:   ICD-10-CM   1. Mobitz type 2 second degree AV block  I44.1     Meds ordered this encounter  Medications  . blood glucose meter kit and supplies KIT    Sig: Dispense  based on patient and insurance preference. Use up to four times daily as directed.    Dispense:  1 each    Refill:  3    Meter/strips  per patients insurance coverage    Order Specific Question:   Number of strips    Answer:   100    Order Specific Question:   Number of lancets    Answer:   100    Return precautions advised.  Garret Reddish, MD

## 2020-08-03 NOTE — Telephone Encounter (Signed)
Janea from [facility] called to say that patient had high grade heartblock 126-136 beats per min with a 2nd degree type 2

## 2020-08-03 NOTE — Telephone Encounter (Signed)
Pt had 90 sec of 2:1 heart block while walking.  He has some dyspnea and chest tightness resolved.  HR then up to 118.  Currently pt is stable.  He will call if further issues - asked him not to drive will send this to Dr. Lalla Brothers, - per notes he had similar episode earlier but no symptoms.

## 2020-08-03 NOTE — Telephone Encounter (Signed)
Monitor tech printed rhythm report showing high grade AV block (26-36), 2nd degree block (46-51) at 9:50 am today.    Called patient.  He said he was eating breakfast at that time and did not experience any dizziness, lightheadedness, presyncope.  Reports no symptoms.  Reviewed with Dr. Izora Ribas (DOD).  He will review with Dr. Lalla Brothers and Dr. Bjorn Pippin.  Pt was being dc'd from hospital on 4/12 - while speaking with cardiology he became dizzy/lightheaded.  His HR slowed.  EP was consulted.  Monitor was applied before dc.  He saw EP in follow up on 4/19.  Plan at that time was for watchful waiting.

## 2020-08-04 ENCOUNTER — Ambulatory Visit (INDEPENDENT_AMBULATORY_CARE_PROVIDER_SITE_OTHER): Payer: Medicare PPO | Admitting: Family Medicine

## 2020-08-04 ENCOUNTER — Telehealth: Payer: Self-pay | Admitting: Cardiology

## 2020-08-04 ENCOUNTER — Encounter: Payer: Self-pay | Admitting: Family Medicine

## 2020-08-04 ENCOUNTER — Other Ambulatory Visit: Payer: Self-pay

## 2020-08-04 DIAGNOSIS — E1159 Type 2 diabetes mellitus with other circulatory complications: Secondary | ICD-10-CM

## 2020-08-04 DIAGNOSIS — I441 Atrioventricular block, second degree: Secondary | ICD-10-CM | POA: Diagnosis not present

## 2020-08-04 DIAGNOSIS — I152 Hypertension secondary to endocrine disorders: Secondary | ICD-10-CM

## 2020-08-04 MED ORDER — BLOOD GLUCOSE MONITOR KIT: PACK | 3 refills | Status: AC

## 2020-08-04 NOTE — Telephone Encounter (Signed)
    Tresa Endo with [employer] calling to report abnormal zio result

## 2020-08-04 NOTE — Telephone Encounter (Signed)
Received monitor report for pt 08/03/2020 at 5:31pm, pt triggered event for Second degree AV block -Mobitz II. And 08/04/2020 (58-118bpm) 10:19am pt triggered event Second Degree AV Block-Mobitz II (48-103bpm)  Spoke with pt who confirms he was symptomatic during both events.  Pt reports dizziness, SOB and pulsing in ears.  Pt was walking his neighborhood during first episode and sitting during second episode.    Alerts taken to Dr Ladona Ridgel, DOD and confirmed second degree block and recommends pt see Dr Lalla Brothers for PPM insertion.

## 2020-08-04 NOTE — Telephone Encounter (Signed)
Hammer, Angeline S routed conversation to [employer] Triage 1 minute ago (2:49 PM)   Lewie Chamber, Angeline S 1 minute ago (2:49 PM)        Tresa Endo with [employer] calling to report abnormal zio result

## 2020-08-04 NOTE — Telephone Encounter (Signed)
Kelly with [employer] is calling to report abnormal EKG results.

## 2020-08-04 NOTE — Telephone Encounter (Signed)
Urgent criteria mobitz 2 was symptomatic per Raymond Gurney from [facility]

## 2020-08-04 NOTE — Telephone Encounter (Signed)
Spoke with Tresa Endo from [facility] who reports that the patient had an episode of symptomatic Mobitz 2 around 2:05pm. Patient reports that he experienced dizziness. Waiting on strips to be faxed.

## 2020-08-04 NOTE — Telephone Encounter (Signed)
Please see previous phone note.  

## 2020-08-04 NOTE — Telephone Encounter (Signed)
Spoke with pt and advised pt will be contacted next Tuesday (04/26) by Dr Lovena Neighbours nurse for further advisement re: Dr Lubertha Basque recommendation of possible pacemaker implant by Dr Lalla Brothers.  Pt advised d/t being symptomatic during episodes he should rest at home and refrain from driving for now.  Reviewed ED precautions.  Pt verbalizes understanding and agrees with current plan.  Monitor alerts given to [facility]. Katrinka Blazing to have Dr Lalla Brothers review.

## 2020-08-04 NOTE — Telephone Encounter (Signed)
Spoke with Tresa Endo from [facility] about patient heart monitor and the abnormal rhythm.  Patient had 23-48 sec 2nd degree heartblock.  Time 5:19am and patient was called and reports anxious feeling and ringing in his ears.  Waiting on strip.

## 2020-08-05 ENCOUNTER — Other Ambulatory Visit: Payer: Self-pay

## 2020-08-05 ENCOUNTER — Encounter (HOSPITAL_COMMUNITY): Payer: Self-pay | Admitting: Emergency Medicine

## 2020-08-05 ENCOUNTER — Telehealth: Payer: Self-pay | Admitting: Cardiology

## 2020-08-05 ENCOUNTER — Inpatient Hospital Stay (HOSPITAL_COMMUNITY): Payer: Medicare PPO

## 2020-08-05 ENCOUNTER — Inpatient Hospital Stay (HOSPITAL_COMMUNITY)
Admission: EM | Admit: 2020-08-05 | Discharge: 2020-08-06 | DRG: 244 | Disposition: A | Discharge status: Home / Self Care | Payer: Medicare PPO | Attending: Cardiology | Admitting: Cardiology

## 2020-08-05 DIAGNOSIS — I441 Atrioventricular block, second degree: Principal | ICD-10-CM | POA: Diagnosis present

## 2020-08-05 DIAGNOSIS — Z79899 Other long term (current) drug therapy: Secondary | ICD-10-CM | POA: Diagnosis not present

## 2020-08-05 DIAGNOSIS — Z20822 Contact with and (suspected) exposure to covid-19: Secondary | ICD-10-CM | POA: Diagnosis present

## 2020-08-05 DIAGNOSIS — R001 Bradycardia, unspecified: Secondary | ICD-10-CM | POA: Diagnosis present

## 2020-08-05 DIAGNOSIS — Z8042 Family history of malignant neoplasm of prostate: Secondary | ICD-10-CM | POA: Diagnosis not present

## 2020-08-05 DIAGNOSIS — Z7982 Long term (current) use of aspirin: Secondary | ICD-10-CM

## 2020-08-05 DIAGNOSIS — E1169 Type 2 diabetes mellitus with other specified complication: Secondary | ICD-10-CM | POA: Diagnosis present

## 2020-08-05 DIAGNOSIS — I452 Bifascicular block: Secondary | ICD-10-CM | POA: Diagnosis present

## 2020-08-05 DIAGNOSIS — Z8249 Family history of ischemic heart disease and other diseases of the circulatory system: Secondary | ICD-10-CM | POA: Diagnosis not present

## 2020-08-05 DIAGNOSIS — Z833 Family history of diabetes mellitus: Secondary | ICD-10-CM | POA: Diagnosis not present

## 2020-08-05 DIAGNOSIS — Z823 Family history of stroke: Secondary | ICD-10-CM | POA: Diagnosis not present

## 2020-08-05 DIAGNOSIS — E119 Type 2 diabetes mellitus without complications: Secondary | ICD-10-CM

## 2020-08-05 DIAGNOSIS — I4510 Unspecified right bundle-branch block: Secondary | ICD-10-CM | POA: Diagnosis present

## 2020-08-05 DIAGNOSIS — Z950 Presence of cardiac pacemaker: Secondary | ICD-10-CM

## 2020-08-05 DIAGNOSIS — Z86010 Personal history of colonic polyps: Secondary | ICD-10-CM | POA: Diagnosis not present

## 2020-08-05 DIAGNOSIS — E785 Hyperlipidemia, unspecified: Secondary | ICD-10-CM | POA: Diagnosis present

## 2020-08-05 DIAGNOSIS — I152 Hypertension secondary to endocrine disorders: Secondary | ICD-10-CM | POA: Diagnosis present

## 2020-08-05 DIAGNOSIS — E1159 Type 2 diabetes mellitus with other circulatory complications: Secondary | ICD-10-CM | POA: Diagnosis present

## 2020-08-05 DIAGNOSIS — R42 Dizziness and giddiness: Secondary | ICD-10-CM | POA: Diagnosis not present

## 2020-08-05 DIAGNOSIS — I10 Essential (primary) hypertension: Secondary | ICD-10-CM | POA: Diagnosis present

## 2020-08-05 LAB — SARS CORONAVIRUS 2 BY RT PCR (HOSPITAL ORDER, PERFORMED IN ~~LOC~~ HOSPITAL LAB): SARS Coronavirus 2: NEGATIVE

## 2020-08-05 LAB — CBC WITH DIFFERENTIAL/PLATELET
Abs Immature Granulocytes: 0.02 10*3/uL (ref 0.00–0.07)
Basophils Absolute: 0 10*3/uL (ref 0.0–0.1)
Basophils Relative: 0 %
Eosinophils Absolute: 0.2 10*3/uL (ref 0.0–0.5)
Eosinophils Relative: 2 %
HCT: 44.2 % (ref 39.0–52.0)
Hemoglobin: 14.7 g/dL (ref 13.0–17.0)
Immature Granulocytes: 0 %
Lymphocytes Relative: 14 %
Lymphs Abs: 1.1 10*3/uL (ref 0.7–4.0)
MCH: 31.2 pg (ref 26.0–34.0)
MCHC: 33.3 g/dL (ref 30.0–36.0)
MCV: 93.8 fL (ref 80.0–100.0)
Monocytes Absolute: 0.6 10*3/uL (ref 0.1–1.0)
Monocytes Relative: 8 %
Neutro Abs: 6 10*3/uL (ref 1.7–7.7)
Neutrophils Relative %: 76 %
Platelets: 260 10*3/uL (ref 150–400)
RBC: 4.71 MIL/uL (ref 4.22–5.81)
RDW: 13.2 % (ref 11.5–15.5)
WBC: 8 10*3/uL (ref 4.0–10.5)
nRBC: 0 % (ref 0.0–0.2)

## 2020-08-05 LAB — BASIC METABOLIC PANEL
Anion gap: 7 (ref 5–15)
BUN: 20 mg/dL (ref 8–23)
CO2: 27 mmol/L (ref 22–32)
Calcium: 9.3 mg/dL (ref 8.9–10.3)
Chloride: 103 mmol/L (ref 98–111)
Creatinine, Ser: 0.96 mg/dL (ref 0.61–1.24)
GFR, Estimated: >60 mL/min (ref >60)
Glucose, Bld: 221 mg/dL — ABNORMAL HIGH (ref 70–99)
Potassium: 4.4 mmol/L (ref 3.5–5.1)
Sodium: 137 mmol/L (ref 135–145)

## 2020-08-05 LAB — SURGICAL PCR SCREEN
MRSA, PCR: NEGATIVE
Staphylococcus aureus: POSITIVE — AB

## 2020-08-05 LAB — TROPONIN I (HIGH SENSITIVITY)
Troponin I (High Sensitivity): 5 ng/L (ref <18)
Troponin I (High Sensitivity): 4 ng/L (ref <18)

## 2020-08-05 LAB — MAGNESIUM: Magnesium: 1.8 mg/dL (ref 1.7–2.4)

## 2020-08-05 LAB — GLUCOSE, CAPILLARY
Glucose-Capillary: 158 mg/dL — ABNORMAL HIGH (ref 70–99)
Glucose-Capillary: 152 mg/dL — ABNORMAL HIGH (ref 70–99)

## 2020-08-05 MED ORDER — CHLORHEXIDINE GLUCONATE 4 % EX LIQD: 60.0000 mL | Freq: Once | CUTANEOUS | Status: DC

## 2020-08-05 MED ORDER — THE SENSUOUS HEART BOOK
Freq: Once | Status: DC
  Filled 2020-08-05: qty 1

## 2020-08-05 MED ORDER — ENOXAPARIN SODIUM 40 MG/0.4ML ~~LOC~~ SOLN
40.0000 mg | SUBCUTANEOUS | Status: DC
  Administered 2020-08-05 – 2020-08-06 (×2): 40 mg via SUBCUTANEOUS
  Filled 2020-08-05 (×2): qty 0.4

## 2020-08-05 MED ORDER — SODIUM CHLORIDE 0.9 % IV SOLN: 80.0000 mg | INTRAVENOUS | Status: DC

## 2020-08-05 MED ORDER — SODIUM CHLORIDE 0.9 % IV SOLN
80.0000 mg | INTRAVENOUS | Status: AC
  Administered 2020-08-06: 80 mg
  Filled 2020-08-05: qty 2

## 2020-08-05 MED ORDER — MUPIROCIN 2 % EX OINT
1.0000 "application " | TOPICAL_OINTMENT | Freq: Two times a day (BID) | CUTANEOUS | Status: DC
  Administered 2020-08-05 – 2020-08-06 (×2): 1 via NASAL
  Filled 2020-08-05 (×2): qty 22

## 2020-08-05 MED ORDER — CHLORHEXIDINE GLUCONATE CLOTH 2 % EX PADS: 6.0000 | MEDICATED_PAD | Freq: Every day | CUTANEOUS | Status: DC

## 2020-08-05 MED ORDER — OFF THE BEAT BOOK
Freq: Once | Status: DC
  Filled 2020-08-05: qty 1

## 2020-08-05 MED ORDER — CEFAZOLIN SODIUM-DEXTROSE 2-4 GM/100ML-% IV SOLN
2.0000 g | INTRAVENOUS | Status: AC
  Administered 2020-08-06: 2 g via INTRAVENOUS
  Filled 2020-08-05: qty 100

## 2020-08-05 MED ORDER — CEFAZOLIN SODIUM-DEXTROSE 2-4 GM/100ML-% IV SOLN: 2.0000 g | INTRAVENOUS | Status: DC

## 2020-08-05 MED ORDER — ACETAMINOPHEN 325 MG PO TABS: 650.0000 mg | ORAL_TABLET | ORAL | Status: DC | PRN

## 2020-08-05 MED ORDER — ONDANSETRON HCL 4 MG/2ML IJ SOLN: 4.0000 mg | Freq: Four times a day (QID) | INTRAMUSCULAR | Status: DC | PRN

## 2020-08-05 MED ORDER — CHLORHEXIDINE GLUCONATE 4 % EX LIQD
60.0000 mL | Freq: Once | CUTANEOUS | Status: DC
  Filled 2020-08-05: qty 15
  Filled 2020-08-05: qty 60

## 2020-08-05 MED ORDER — NITROGLYCERIN 0.4 MG SL SUBL: 0.4000 mg | SUBLINGUAL_TABLET | SUBLINGUAL | Status: DC | PRN

## 2020-08-05 MED ORDER — ASPIRIN EC 81 MG PO TBEC
81.0000 mg | DELAYED_RELEASE_TABLET | Freq: Every day | ORAL | Status: DC
  Administered 2020-08-06: 81 mg via ORAL
  Filled 2020-08-05: qty 1

## 2020-08-05 MED ORDER — BENAZEPRIL HCL 40 MG PO TABS
40.0000 mg | ORAL_TABLET | Freq: Every day | ORAL | Status: DC
  Administered 2020-08-06: 40 mg via ORAL
  Filled 2020-08-05: qty 1

## 2020-08-05 MED ORDER — ATORVASTATIN CALCIUM 10 MG PO TABS
20.0000 mg | ORAL_TABLET | Freq: Every day | ORAL | Status: DC
  Administered 2020-08-06: 20 mg via ORAL
  Filled 2020-08-05: qty 2

## 2020-08-05 MED ORDER — SODIUM CHLORIDE 0.9 % IV SOLN: INTRAVENOUS | Status: DC

## 2020-08-05 MED ORDER — YOU HAVE A PACEMAKER BOOK
Freq: Once | Status: DC
  Filled 2020-08-05: qty 1

## 2020-08-05 MED ORDER — INSULIN ASPART 100 UNIT/ML ~~LOC~~ SOLN
0.0000 [IU] | Freq: Three times a day (TID) | SUBCUTANEOUS | Status: DC
  Administered 2020-08-05: 3 [IU] via SUBCUTANEOUS
  Administered 2020-08-06: 2 [IU] via SUBCUTANEOUS

## 2020-08-05 NOTE — ED Notes (Signed)
Patient taken to Xray  

## 2020-08-05 NOTE — ED Provider Notes (Signed)
[place] EMERGENCY DEPARTMENT Provider Note   CSN: [id number] Arrival date & time: 08/05/20  1358     History Chief Complaint  Patient presents with  . Bradycardia    Justin [name] is a 65 y.o. male.  Patient presents chief complaint of lightheadedness palpitations.  He was seen here about a week ago and admitted for chest pain evaluation.  At the time he was found to have intermittent episodes of 2-1 AV block Mobitz type II pattern.  Holter monitor was placed and he was observed by cardiology.  This morning they noted that he had went back into a 2-1 AV block pattern and was called by cardiology to come to the ER.  Patient otherwise denies any fevers or cough.  No vomiting no diarrhea.  Denies any pain at this time.        Past Medical History:  Diagnosis Date  . Allergy   . Diabetes mellitus 2007  . ED (erectile dysfunction)    sildenafil 100 mg sparingly  . History of colonic polyps   . Hyperlipidemia associated with type 2 diabetes mellitus ([place])   . Hypertension   . PROSTATITIS, ACUTE 12/02/2009   Qualifier: Diagnosis of  By: Silvio Pate MD, [facility]     Patient Active Problem List   Diagnosis Date Noted  . Mobitz type 2 second degree AV block 08/04/2020  . Chest pain at rest 07/23/2020  . Dupuytren contracture 11/24/2018  . RBBB 07/14/2018  . Hyperlipidemia associated with type 2 diabetes mellitus ([place]) 02/02/2018  . Family history of prostate cancer 02/02/2018  . ED (erectile dysfunction)   . History of colonic polyps 01/18/2008  . Diabetes mellitus without complication ([place]) 32/03/2481  . Hypertension associated with diabetes ([place]) 12/23/2006  . Allergic rhinitis 11/07/2006    Past Surgical History:  Procedure Laterality Date  . None         Family History  Problem Relation Age of Onset  . Dementia Father   . CAD Father        CABG mid 34s, never smoker  . Hypertension Mother   . Stroke Mother        44, first stroke 92.  never smoker  . Diabetes Mother        in late life  . Hypertension Sister   . Diabetes Brother   . Hypertension Brother   . Prostate cancer Brother        62  . Cerebral palsy Daughter   . Cerebral palsy Daughter   . Pneumonia Maternal Grandfather        died from this  . Diabetes Paternal Grandfather   . Colon cancer Neg Hx     Social History   Tobacco Use  . Smoking status: Never Smoker  . Smokeless tobacco: Never Used  Vaping Use  . Vaping Use: Never used  Substance Use Topics  . Alcohol use: Yes    Comment: socially  . Drug use: No    Home Medications Prior to Admission medications   Medication Sig Start Date End Date Taking? Authorizing Provider  acetaminophen (TYLENOL) 500 MG tablet Take 1,000 mg by mouth every 6 (six) hours as needed for mild pain.    [provider]  aspirin EC 81 MG EC tablet Take 1 tablet (81 mg total) by mouth daily. Swallow whole. 07/25/20 07/20/21  Kayleen Memos, DO  atorvastatin (LIPITOR) 20 MG tablet TAKE 1 TABLET BY MOUTH EVERY DAY 06/14/20   Garret Reddish  O, MD  benazepril (LOTENSIN) 40 MG tablet Take 1 tablet (40 mg total) by mouth daily. 07/25/20 10/23/20  Kayleen Memos, DO  blood glucose meter kit and supplies KIT Dispense based on patient and insurance preference. Use up to four times daily as directed. 08/04/20   Marin Olp, MD  cetirizine (ZYRTEC) 10 MG tablet Take 10 mg by mouth daily as needed for allergies.    [provider]  glucose blood (ONETOUCH VERIO) test strip Use as instructed Patient not taking: Reported on 08/04/2020 05/04/12   Marletta Lor, MD  Multiple Vitamin (MULTIVITAMIN) capsule Take 1 capsule by mouth daily.    [provider]  [facility] DELICA LANCETS FINE MISC 1 each by Does not apply route 2 (two) times daily. Patient not taking: Reported on 08/04/2020 05/04/12   Marletta Lor, MD  SYNJARDY XR 12.08-998 MG TB24 TAKE 1 TABLET BY MOUTH TWICE A DAY 05/19/20   Marin Olp, MD  tadalafil (CIALIS) 20 MG tablet Take 1 tablet (20 mg total) by mouth every other day as needed for erectile dysfunction. 11/24/18   Marin Olp, MD  TRULICITY 1.5 HT/9.7FS SOPN INJECT 1.5 MG INTO THE SKIN ONCE A WEEK. 05/03/20   Marin Olp, MD    Allergies    Patient has no known allergies.  Review of Systems   Review of Systems  Constitutional: Negative for fever.  HENT: Negative for ear pain and sore throat.   Eyes: Negative for pain.  Respiratory: Negative for cough.   Cardiovascular: Negative for chest pain.  Gastrointestinal: Negative for abdominal pain.  Genitourinary: Negative for flank pain.  Musculoskeletal: Negative for back pain.  Skin: Negative for color change and rash.  Neurological: Negative for syncope.  All other systems reviewed and are negative.   Physical Exam Updated Vital Signs BP 137/62   Pulse (!) 48   Temp 97.8 F (36.6 C) (Oral)   Resp 15   SpO2 99%   Physical Exam Constitutional:      General: He is not in acute distress.    Appearance: He is well-developed.  HENT:     Head: Normocephalic.     Nose: Nose normal.  Eyes:     Extraocular Movements: Extraocular movements intact.  Cardiovascular:     Rate and Rhythm: Bradycardia present.  Pulmonary:     Effort: Pulmonary effort is normal.  Skin:    Coloration: Skin is not jaundiced.  Neurological:     Mental Status: He is alert. Mental status is at baseline.     ED Results / Procedures / Treatments   Labs (all labs ordered are listed, but only abnormal results are displayed) Labs Reviewed  SURGICAL PCR SCREEN  SARS CORONAVIRUS 2 BY RT PCR ([place] [facility])  BASIC METABOLIC PANEL  MAGNESIUM  CBC WITH DIFFERENTIAL/PLATELET  TROPONIN I (HIGH SENSITIVITY)    EKG None  Radiology No results found.  Procedures .Critical Care E&M Performed by: Luna Fuse, MD  Critical care provider statement:    Critical care time  (minutes):  30   Critical care time was exclusive of:  Separately billable procedures and treating other patients   Critical care was necessary to treat or prevent imminent or life-threatening deterioration of the following conditions:  Cardiac failure After initial E/M assessment, critical care services were subsequently performed that were exclusive of separately billable procedures or treatment.       Medications Ordered in ED  Medications  ceFAZolin (ANCEF) IVPB 2g/100 mL premix (has no administration in time range)  gentamicin (GARAMYCIN) 80 mg in sodium chloride 0.9 % 500 mL irrigation (has no administration in time range)  chlorhexidine (HIBICLENS) 4 % liquid 4 application (has no administration in time range)    ED Course  I have reviewed the triage vital signs and the nursing notes.  Pertinent labs & imaging results that were available during my care of the patient were reviewed by me and considered in my medical decision making (see chart for details).    MDM Rules/Calculators/A&P                          EKG monitor continues to show a Mobitz type II AV block.  Blood pressure remained stable in 406 systolic.  Heart rate in the 40 to 45 bpm range.  Cardiology is aware of the patient, they plan to take him to the Cath Lab for pacemaker placement.  Cardiac pads placed on patient, patient HR continued to be monitored in ER.  preop labs have been ordered, patient awaiting cath lab.  Final Clinical Impression(s) / ED Diagnoses Final diagnoses:  AV block, Mobitz 2  Bradycardia  Dizziness    Rx / DC Orders ED Discharge Orders    None       Luna Fuse, MD 08/05/20 1520

## 2020-08-05 NOTE — ED Triage Notes (Signed)
Pt reports HR down to as low as 42 since 5:30am.  Reports pain to center of chest this morning.  Denies pain at present.  Intermittent nausea.  Pt wearing halter monitor.  States cardiologist advised him to come to ED and being scheduled for a pacemaker.

## 2020-08-05 NOTE — Progress Notes (Signed)
EP Update  Patient to have permanent pacemaker placed Sunday afternoon ~ 13:00. Please keep NPO after midnight.  Sheria Lang T. Lalla Brothers, MD, [facility], [facility] Cardiac Electrophysiology

## 2020-08-05 NOTE — Telephone Encounter (Signed)
Called and spoke with the patient today. He is doing well but continues to have intermittent episodes of ear ringing that correspond to episodes of 2:1 AV block. He tells me that today he had an episode during which he was lightheaded.  Given the persistence of symptoms and frequency, I have aksed Justin [name] to present to the ER for admission. We will plan to keep him NPO after midnight tonight for possible pacemaker Sunday. He understands that depending on lab availability, it could be early this week before he receives his pacemaker.  Patient is to have a driver bring him to the hospital. He should not drive.  Sheria Lang T. Lalla Brothers, MD, [facility], [facility] Cardiac Electrophysiology

## 2020-08-05 NOTE — ED Provider Notes (Signed)
MSE was initiated and I personally evaluated the patient and placed orders (if any) at  2:25 PM on August 05, 2020.  Patient placed in Quick Look pathway, seen and evaluated   Chief Complaint: Lightheaded/dizziness  HPI:   Patient with history of second-degree type II heart block.  EKG consistent with this today.  He is symptomatic of this.  Was told by cardiology to come to ER if this occurred.  He denies any chest pain presently.  Is sitting and does not feel lightheaded at this moment.  ROS: Lightheadedness (one)  Physical Exam:   Gen: No distress  Neuro: Awake and Alert  Skin: Warm    Focused Exam: Heart rate palpated in bilateral radial artery pulses is approximately 50 bpm. Lungs clear to auscultation all fields.  No murmurs rubs or gallops.   Added magnesium onto work-up.  TSH was obtained 12 days ago was normal.  Patient is high-priority to move back to major care.  RN made aware.  Initiation of care has begun. The patient has been counseled on the process, plan, and necessity for staying for the completion/evaluation, and the remainder of the medical screening examination    The patient appears stable so that the remainder of the MSE may be completed by another provider.   Solon Augusta Darling, [place] 08/05/20 1427    Tilden Fossa, MD 08/05/20 1446

## 2020-08-05 NOTE — H&P (Addendum)
Cardiology Admission History and Physical:   Patient ID: Justin [name] MRN: [id number]; DOB: 08/05/1955   Admission date: 08/05/2020  PCP:  Marin Olp, [place]  Cardiologist:  Donato Heinz, MD  Electrophysiologist:  Vickie Epley, MD   Chief Complaint: "lightheaded/dizzy" (symptomatic bradycardia)  Patient Profile:   Justin [name] is a 64 y.o. male with history of symptomatic bradycardia with 2:1 AV block noted on current outpatient monitor, hypertension, hyperlipidemia, and type 2 diabetes mellitus who presents today for symptomatic bradycardia.  History of Present Illness:   Mr. Justin [name] is a 65 year old male with the above history who is followed by Dr. Gardiner Rhyme and Dr. Quentin Ore. Patient with no cardiac history until recent admission. He was admitted from 07/23/2020 to 07/25/2020 for new onset chest pain that woke him up in the middle of the night and was found to be in intermittent 2:1 AV block with heart rates dropping down to the 40's. Heart rates in the 80's to 90s when in 1:1. He had associated dizziness/lightheadedness when his heart rates dropped. Electrolytes and TSH normal. Echo showed LVEF of 65-70% with normal wall motion and grade 1 diastolic dysfunction. Chest pain was felt to be atypical. EP was consulted but was not felt to need a pacemaker yet. Outpatient monitor was ordered. Our office has received multiple calls from [facility] with reports of Mobitz 2 AV block and high grade AV block. We were initially going to try to see him in the office early next week and arrange for PPM implantation. However, patient has become slightly more symptomatic (lightheaded, dizzy, tinnitus) and therefore was advised to come to the hospital for pacemaker implantation.   In the ED: Patient bradycardic at times but otherwise vitals stable. EKG shows sinus rhythm with 2:1 AV block, rate 45 bpm, as well RBBB and LAFB. All labs pending.  Chest x-ray pending.  At the time of this evaluation, patient resting comfortable. Telemetry shows intermittent 2:1 AV block. He is asymptomatic at this time. However, he states he was lightheaded and dizzy with some tinnitus from about 5:30am to 9:30am this morning. Symptoms return around 10:30am and lasted for about 30 minutes. He has been asymptomatic since that time. He states he felt like his heart was beating hard but slow this morning. Also notes some very brief episode of minimal chest tightness this morning that felt like he was being "tazzed." However, no significant chest pain since last admission. N exertional chest pain. He did state he was walking a few days ago and his heart rates went from 160 bpm to 40 bpm suddenly and he was short of breath with this. However, no other shortness of breath. No orthopnea, PND, or edema. No recent fevers or illnesses. He has dry cough that he is not sure if it is from allergies or his Benazepril. No abnormal bleeding in urine or stool.   Past Medical History:  Diagnosis Date  . Allergy   . Diabetes mellitus 2007  . ED (erectile dysfunction)    sildenafil 100 mg sparingly  . History of colonic polyps   . Hyperlipidemia associated with type 2 diabetes mellitus ([place])   . Hypertension   . PROSTATITIS, ACUTE 12/02/2009   Qualifier: Diagnosis of  By: Silvio Pate MD, [facility]     Past Surgical History:  Procedure Laterality Date  . None       Medications Prior to Admission: Prior to Admission medications   Medication  Sig Start Date End Date Taking? Authorizing Provider  acetaminophen (TYLENOL) 500 MG tablet Take 1,000 mg by mouth every 6 (six) hours as needed for mild pain.    [provider]  aspirin EC 81 MG EC tablet Take 1 tablet (81 mg total) by mouth daily. Swallow whole. 07/25/20 07/20/21  Kayleen Memos, DO  atorvastatin (LIPITOR) 20 MG tablet TAKE 1 TABLET BY MOUTH EVERY DAY 06/14/20   Marin Olp, MD  benazepril (LOTENSIN) 40 MG  tablet Take 1 tablet (40 mg total) by mouth daily. 07/25/20 10/23/20  Kayleen Memos, DO  blood glucose meter kit and supplies KIT Dispense based on patient and insurance preference. Use up to four times daily as directed. 08/04/20   Marin Olp, MD  cetirizine (ZYRTEC) 10 MG tablet Take 10 mg by mouth daily as needed for allergies.    [provider]  glucose blood (ONETOUCH VERIO) test strip Use as instructed Patient not taking: Reported on 08/04/2020 05/04/12   Marletta Lor, MD  Multiple Vitamin (MULTIVITAMIN) capsule Take 1 capsule by mouth daily.    [provider]  [facility] DELICA LANCETS FINE MISC 1 each by Does not apply route 2 (two) times daily. Patient not taking: Reported on 08/04/2020 05/04/12   Marletta Lor, MD  SYNJARDY XR 12.08-998 MG TB24 TAKE 1 TABLET BY MOUTH TWICE A DAY 05/19/20   Marin Olp, MD  tadalafil (CIALIS) 20 MG tablet Take 1 tablet (20 mg total) by mouth every other day as needed for erectile dysfunction. 11/24/18   Marin Olp, MD  TRULICITY 1.5 WS/5.6CL SOPN INJECT 1.5 MG INTO THE SKIN ONCE A WEEK. 05/03/20   Marin Olp, MD     Allergies:   No Known Allergies  Social History:   Social History   Socioeconomic History  . Marital status: Married    Spouse name: Not on file  . Number of children: Not on file  . Years of education: Not on file  . Highest education level: Not on file  Occupational History  . Occupation: Retired  Tobacco Use  . Smoking status: Never Smoker  . Smokeless tobacco: Never Used  Vaping Use  . Vaping Use: Never used  Substance and Sexual Activity  . Alcohol use: Yes    Comment: socially  . Drug use: No  . Sexual activity: Yes    Comment: wife only  Other Topics Concern  . Not on file  Social History Narrative   Married. Identical twin daughters 22 in 2019- mirror twins- born 7 weeks. Both daughters have CP and are wheelchair bound - both live in [place] - graduated top of  their classes and have MBA from [employer]. 1 daughters married- they all live together.       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       Hobbies: travel, walks and hikes, skiing       Social Determinants of Health   Financial Resource Strain: Not on file  Food Insecurity: Not on file  Transportation Needs: Not on file  Physical Activity: Not on file  Stress: Not on file  Social Connections: Not on file  Intimate Partner Violence: Not on file    Family History:   The patient's family history includes CAD in his father; Cerebral palsy in his daughter and daughter; Dementia in his father; Diabetes in his brother, mother, and paternal grandfather; Hypertension in his brother, mother, and sister; Pneumonia in his maternal  grandfather; Prostate cancer in his brother; Stroke in his mother. There is no history of Colon cancer.    ROS:  Please see the history of present illness.  All other ROS reviewed and negative.     Physical Exam/Data:   Vitals:   08/05/20 1413  BP: (!) 155/60  Pulse: (!) 49  Resp: 18  Temp: 97.8 F (36.6 C)  TempSrc: Oral  SpO2: 100%   No intake or output data in the 24 hours ending 08/05/20 1420 Last 3 Weights 08/04/2020 08/01/2020 07/25/2020  Weight (lbs) 163 lb 6.4 oz 163 lb 157 lb 9.6 oz  Weight (kg) 74.118 kg 73.936 kg 71.487 kg     There is no height or weight on file to calculate BMI.  General: 65 y.o. male resting comfortably in no acute distress. HEENT: Normocephalic and atraumatic. Sclera clear.  Neck: Supple. No carotid bruits. No JVD. Heart: RRR. Distinct S1 and S2. No murmurs, gallops, or rubs. Radial and distal pedal pulses 2+ and equal bilaterally. Lungs: No increased work of breathing. Clear to ausculation bilaterally. No wheezes, rhonchi, or rales.  Abdomen: Soft, non-distended, and non-tender to palpation. Bowel sounds present. Extremities: No lower extremity edema.    Skin: Warm and dry. Neuro: Alert and oriented x3. No focal  deficits. Psych: Normal affect. Responds appropriately.  EKG:  The ECG that was done was personally reviewed and demonstrates sinus rhythm with 2:1 AV block, rate 45 bpm, as well RBBB and LAFB.  Telemetry: Telemetry personally reviewed and demonstrates sinus rhythm with intermittent 2:1 AV block. Rates in the 40's when in 2:1 and in the 80's to 90's when in 1:1.  Relevant CV Studies:  Echocardiogram 07/24/2020: Impressions: 1. Left ventricular ejection fraction, by estimation, is 65 to 70%. Left  ventricular ejection fraction by 2D MOD biplane is 67.4 %. The left  ventricle has normal function. The left ventricle has no regional wall  motion abnormalities. Left ventricular  diastolic parameters are consistent with Grade I diastolic dysfunction  (impaired relaxation).  2. Right ventricular systolic function is normal. The right ventricular  size is normal. There is normal pulmonary artery systolic pressure. The  estimated right ventricular systolic pressure is 41.9 mmHg.  3. The mitral valve is grossly normal. Trivial mitral valve  regurgitation.  4. The aortic valve is tricuspid. Aortic valve regurgitation is trivial.  Mild aortic valve sclerosis is present, with no evidence of aortic valve  stenosis. Aortic regurgitation PHT measures 518 msec.  5. The inferior vena cava is normal in size with greater than 50%  respiratory variability, suggesting right atrial pressure of 3 mmHg.   Comparison(s): No prior Echocardiogram.   Laboratory Data:  High Sensitivity Troponin:   Recent Labs  Lab 07/23/20 1220 07/23/20 1423 07/24/20 0522  TROPONINIHS _0 ChemistryNo results for input(s): NA, K, CL, CO2, GLUCOSE, BUN, CREATININE, CALCIUM, GFRNONAA, GFRAA, ANIONGAP in the last 168 hours.  No results for input(s): PROT, ALBUMIN, AST, ALT, ALKPHOS, BILITOT in the last 168 hours. HematologyNo results for input(s): WBC, RBC, HGB, HCT, MCV, MCH, MCHC, RDW, PLT in the last 168  hours. BNPNo results for input(s): BNP, PROBNP in the last 168 hours.  DDimer No results for input(s): DDIMER in the last 168 hours.   Radiology/Studies:  No results found.   Assessment and Plan:   Symptomatic Bradycardia 2:1 AV Block - Patient admitted with intermittent 2:1 AV block and high grade AV block on outpatient monitor with heart rates  dropping to the 30's to 40's.  He had some lightheadedness/dizziness earlier this morning with this. Currently asymptomatic. Hemodynamically stable. - EKG on admission shows sinus rhythm with 2:1 AV block, rate 45 bpm, as well RBBB and LAFB. - Telmetery shows intermittent 2:1 AV block. Rates in the 40's when in 2:1 and in the 80's to 90's when in 1:1. - Recent Echo showed normal LV function and normal wall motion. - Will admit and plan is for PPM placement tomorrow with Dr. Quentin Ore. Will make NPO at midnight.  Hypertension - BP slightly elevated. - Continue home Benazepril 55m daily.  Hyperlipidemia - Continue home Lipitor 266mdaily.  Type 2 Diabetes Mellitus - On Synjardy and Trulicity at home. Will place on sliding scale insulin here.      Risk Assessment/Risk Scores:   N/A.  Severity of Illness: The appropriate patient status for this patient is INPATIENT. Inpatient status is judged to be reasonable and necessary in order to provide the required intensity of service to ensure the patient's safety. The patient's presenting symptoms, physical exam findings, and initial radiographic and laboratory data in the context of their chronic comorbidities is felt to place them at high risk for further clinical deterioration. Furthermore, it is not anticipated that the patient will be medically stable for discharge from the hospital within 2 midnights of admission. The following factors support the patient status of inpatient.   " The patient's presenting symptoms include bradycardia, lightheadedness, dizziness. " The worrisome physical exam  findings include intermittent 2:1 AV block. " The initial radiographic and laboratory data as above . " The chronic co-morbidities include HTN, HLD, DM.   * I certify that at the point of admission it is my clinical judgment that the patient will require inpatient hospital care spanning beyond 2 midnights from the point of admission due to high intensity of service, high risk for further deterioration and high frequency of surveillance required.*    For questions or updates, please contact [facility] consult [email or web address] for contact info under     Signed, CaDarreld McleanPA-C  08/05/2020 2:20 PM  As above, patient seen and examined.  Briefly he is a [age]ear old male with past medical history of diabetes mellitus, hypertension, hyperlipidemia admitted with symptomatic bradycardia.  Recently admitted with atypical chest pain.  Echocardiogram April 2022 showed normal LV function, grade 1 diastolic dysfunction, trace aortic insufficiency.  During that admission patient was noted to have intermittent 2-1 AV block.  He was discharged and outpatient monitor ordered.  Patient has had intermittent episodes of dizziness and ringing in his ears with documented 2-1 AV block and heart rates in the 40s.  He was therefore asked to present to the emergency room and will be admitted for pacemaker.  Note he typically does not have dyspnea on exertion and denies exertional chest pain.  He has not had syncope.  Electrocardiogram shows sinus rhythm with 2-1 AV block, right bundle branch block.  1 symptomatic bradycardia/2-1 AV block-patient will be admitted to telemetry.  Plan is for pacemaker tomorrow if schedule allows.  Note he is on no AV nodal blocking agents and his LV function is normal.  2 hypertension-patient's blood pressure is controlled.  Continue ACE inhibitor.  3 hyperlipidemia-continue statin.  BrKirk Ruths

## 2020-08-05 NOTE — Plan of Care (Signed)

## 2020-08-06 ENCOUNTER — Inpatient Hospital Stay (HOSPITAL_COMMUNITY)
Admission: EM | Disposition: A | Discharge status: Home / Self Care | Payer: Self-pay | Source: Home / Self Care | Attending: Cardiology

## 2020-08-06 ENCOUNTER — Inpatient Hospital Stay (HOSPITAL_COMMUNITY): Payer: Medicare PPO

## 2020-08-06 DIAGNOSIS — Z950 Presence of cardiac pacemaker: Secondary | ICD-10-CM

## 2020-08-06 DIAGNOSIS — R001 Bradycardia, unspecified: Secondary | ICD-10-CM

## 2020-08-06 HISTORY — PX: PACEMAKER IMPLANT: EP1218

## 2020-08-06 LAB — GLUCOSE, CAPILLARY
Glucose-Capillary: 121 mg/dL — ABNORMAL HIGH (ref 70–99)
Glucose-Capillary: 104 mg/dL — ABNORMAL HIGH (ref 70–99)
Glucose-Capillary: 93 mg/dL (ref 70–99)

## 2020-08-06 LAB — BASIC METABOLIC PANEL
Anion gap: 7 (ref 5–15)
BUN: 16 mg/dL (ref 8–23)
CO2: 27 mmol/L (ref 22–32)
Calcium: 8.9 mg/dL (ref 8.9–10.3)
Chloride: 105 mmol/L (ref 98–111)
Creatinine, Ser: 0.93 mg/dL (ref 0.61–1.24)
GFR, Estimated: >60 mL/min (ref >60)
Glucose, Bld: 123 mg/dL — ABNORMAL HIGH (ref 70–99)
Potassium: 3.9 mmol/L (ref 3.5–5.1)
Sodium: 139 mmol/L (ref 135–145)

## 2020-08-06 SURGERY — PACEMAKER IMPLANT
Anesthesia: LOCAL

## 2020-08-06 MED ORDER — FENTANYL CITRATE (PF) 100 MCG/2ML IJ SOLN
INTRAMUSCULAR | Status: AC
  Filled 2020-08-06: qty 2

## 2020-08-06 MED ORDER — CEFAZOLIN SODIUM-DEXTROSE 2-4 GM/100ML-% IV SOLN
INTRAVENOUS | Status: AC
  Filled 2020-08-06: qty 100

## 2020-08-06 MED ORDER — LIDOCAINE HCL 1 % IJ SOLN
INTRAMUSCULAR | Status: AC
  Filled 2020-08-06: qty 60

## 2020-08-06 MED ORDER — SODIUM CHLORIDE 0.9 % IV SOLN
INTRAVENOUS | Status: AC
  Filled 2020-08-06: qty 2

## 2020-08-06 MED ORDER — ACETAMINOPHEN 325 MG PO TABS
325.0000 mg | ORAL_TABLET | ORAL | Status: DC | PRN
Start: 2020-08-06 — End: 2020-08-06

## 2020-08-06 MED ORDER — LIDOCAINE HCL (PF) 1 % IJ SOLN
INTRAMUSCULAR | Status: DC | PRN
  Administered 2020-08-06: 45 mL

## 2020-08-06 MED ORDER — SODIUM CHLORIDE 0.9 % IV SOLN
INTRAVENOUS | Status: DC | PRN
  Administered 2020-08-06: 500 mL

## 2020-08-06 MED ORDER — FENTANYL CITRATE (PF) 100 MCG/2ML IJ SOLN
INTRAMUSCULAR | Status: DC | PRN
  Administered 2020-08-06 (×2): 25 ug via INTRAVENOUS

## 2020-08-06 MED ORDER — MIDAZOLAM HCL 5 MG/5ML IJ SOLN
INTRAMUSCULAR | Status: AC
  Filled 2020-08-06: qty 5

## 2020-08-06 MED ORDER — HEPARIN (PORCINE) IN NACL 1000-0.9 UT/500ML-% IV SOLN
INTRAVENOUS | Status: AC
  Filled 2020-08-06: qty 500

## 2020-08-06 MED ORDER — MIDAZOLAM HCL 5 MG/5ML IJ SOLN
INTRAMUSCULAR | Status: DC | PRN
  Administered 2020-08-06 (×2): 1 mg via INTRAVENOUS

## 2020-08-06 MED ORDER — ONDANSETRON HCL 4 MG/2ML IJ SOLN: 4.0000 mg | Freq: Four times a day (QID) | INTRAMUSCULAR | Status: DC | PRN

## 2020-08-06 MED ORDER — HEPARIN (PORCINE) IN NACL 1000-0.9 UT/500ML-% IV SOLN
INTRAVENOUS | Status: DC | PRN
  Administered 2020-08-06: 500 mL

## 2020-08-06 MED ORDER — CEFAZOLIN SODIUM-DEXTROSE 2-3 GM-%(50ML) IV SOLR: INTRAVENOUS | Status: DC | PRN

## 2020-08-06 SURGICAL SUPPLY — 12 items
CABLE SURGICAL S-101-97-12 (CABLE) ×2 IMPLANT
CATH RIGHTSITE C315HIS02 (CATHETERS) ×2 IMPLANT
IPG PACE AZUR XT DR MRI W1DR01 (Pacemaker) ×1 IMPLANT
LEAD CAPSURE NOVUS 5076-52CM (Lead) ×2 IMPLANT
LEAD SELECT SECURE 3830 383069 (Lead) ×1 IMPLANT
PACE AZURE XT DR MRI W1DR01 (Pacemaker) ×2 IMPLANT
PAD PRO RADIOLUCENT 2001M-C (PAD) ×2 IMPLANT
SELECT SECURE 3830 383069 (Lead) ×2 IMPLANT
SHEATH 7FR PRELUDE SNAP 13 (SHEATH) ×4 IMPLANT
SLITTER 6232ADJ (MISCELLANEOUS) ×2 IMPLANT
TRAY PACEMAKER INSERTION (PACKS) ×2 IMPLANT
WIRE HI TORQ VERSACORE-J 145CM (WIRE) ×2 IMPLANT

## 2020-08-06 NOTE — Discharge Instructions (Signed)
After Your Pacemaker   . You have a Medtronic Pacemaker  ACTIVITY . Do not lift your arm above shoulder height for 1 week after your procedure. After 7 days, you may progress as below.  . You should remove your sling 24 hours after your procedure, unless otherwise instructed by your provider.     Sunday Aug 13, 2020  Monday Aug 14, 2020 Tuesday Aug 15, 2020 Wednesday Aug 16, 2020   . Do not lift, push, pull, or carry anything over 10 pounds with the affected arm until 6 weeks (Sunday September 17, 2020 ) after your procedure.   . You may drive AFTER your wound check, unless you have been told otherwise by your provider.   . Ask your healthcare provider when you can go back to work   INCISION/Dressing  . If large square, outer bandage is left in place, this can be removed after 24 hours from your procedure. Do not remove steri-strips or glue as below.   . Monitor your Pacemaker site for redness, swelling, and drainage. Call the device clinic at [phone] if you experience these symptoms or fever/chills.  . If your incision is sealed with Steri-strips or staples, you may shower 7 days after your procedure or when told by your provider. Do not remove the steri-strips or let the shower hit directly on your site. You may wash around your site with soap and water.    Marland Kitchen Avoid lotions, ointments, or perfumes over your incision until it is well-healed.  . You may use a hot tub or a pool AFTER your wound check appointment if the incision is completely closed.  Marland Kitchen PAcemaker Alerts:  Some alerts are vibratory and others beep. These are NOT emergencies. Please call our office to let us know. If this occurs at night or on weekends, it can wait until the next business day. Send a remote transmission.  . If your device is capable of reading fluid status (for heart failure), you will be offered monthly monitoring to review this with you.   DEVICE MANAGEMENT . Remote monitoring is used to monitor your  pacemaker from home. This monitoring is scheduled every 91 days by our office. It allows [place] to keep an eye on the functioning of your device to ensure it is working properly. You will routinely see your Electrophysiologist annually (more often if necessary).   . You should receive your ID card for your new device in 4-8 weeks. Keep this card with you at all times once received. Consider wearing a medical alert bracelet or necklace.  . Your Pacemaker may be MRI compatible. This will be discussed at your next office visit/wound check.  You should avoid contact with strong electric or magnetic fields.    Do not use amateur (ham) radio equipment or electric (arc) welding torches. MP3 player headphones with magnets should not be used. Some devices are safe to use if held at least 12 inches (30 cm) from your Pacemaker. These include power tools, lawn mowers, and speakers. If you are unsure if something is safe to use, ask your health care provider.   When using your cell phone, hold it to the ear that is on the opposite side from the Pacemaker. Do not leave your cell phone in a pocket over the Pacemaker.   You may safely use electric blankets, heating pads, computers, and microwave ovens.  Call the office right away if:  You have chest pain.  You feel more short of breath than  you have felt before.  You feel more light-headed than you have felt before.  Your incision starts to open up.  This information is not intended to replace advice given to you by your health care provider. Make sure you discuss any questions you have with your health care provider.

## 2020-08-06 NOTE — Discharge Summary (Signed)
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  I placed a MDT 5076 (SN [id number]) on the interventricular septum for temporary pacing support. Next, I advanced the MDT septal sheath over a Wholey wire to the RV septum. I then passed a MDT 3830 (SN [id number]) to the septum. Pacing showed a characteristic "m" QRS morphology in V1. I advanced the lead into the septum until the stim-lateral QRS activation time was 42m. In this position, it displayed excelling pacing (0.5 V at 0.5678m and sensing thresholds (14 mV, bipolar) with an acceptable impedance (931 ohms). The lead was secured to the pectoral fascia. Next,I retracted the fixation helix and moved the 5076 lead to the RA appendage. There, it displayed excellent pacing (1 V at 0.78m12mand sensing (3.1 mV) thresholds with an acceptable impedance (551 ohms). It was secured to the pectoral fascia. The pocket was irrigated with copious vancomycin solution.  The leads were then  connected to a pulse generator (model Azure XT DR MRI SureScan [id number]. The pocket was closed in layers of absorbable suture. EBL < 74m77mteri-strips and a sterile dressing were applied.  Conclusions: 1. Symptomatic second degree AV block  2. Successful dual  chamber permanent pacemaker implantation with a left bundle area pacing lead  3.  No early apparent complications.  _____________   History of Present Illness     Justin STIREWALTa 65 y26. male with a history of atypical chest pain, symptomatic bradycardia with 2:1 AV block noted on current outpatient monitor, hypertension, hyperlipidemia, and type 2 diabetes mellitus who was admitted on today 08/05/2020 for symptomatic bradycardia.  Patient with no cardiac history until recent admission. He was admitted from 07/23/2020 to 07/25/2020 for new onset chest pain that woke him up in the middle of the night and was found to be in intermittent 2:1 AV block with heart rates dropping down to the 40's. Heart rates in the 80's to 90s when in 1:1. He had associated dizziness/lightheadedness when his heart rates dropped. Electrolytes and TSH normal. Echo showed LVEF of 65-70% with normal wall motion and grade 1 diastolic dysfunction. Chest pain was felt to be atypical. EP was consulted but was not felt to need a pacemaker yet. Outpatient monitor was ordered. Since then, our office has received [facility] reports of [place] 2 AV block and high grade AV block. We were initially going to try to see him in the office early next week and arrange for PPM implantation. However, patient has become slightly more symptomatic (lightheaded, dizzy, tinnitus) and therefore was advised to come to the hospital for pacemaker implantation.   In the ED: Patient bradycardic at times but otherwise vitals stable. EKG shows sinus rhythm with 2:1 AV block, rate 45 bpm, as well RBBB and LAFB. All labs  pending. Chest x-ray pending.  At the time of initial evaluation, patient resting comfortable. Telemetry showed intermittent 2:1 AV block. He was asymptomatic at this time. However, he stated he was lightheaded and dizzy with some tinnitus from about 5:30am to 9:30am this morning. Symptoms returned around 10:30am and  lasted for about 30 minutes. He had been asymptomatic since that time. He stated he felt like his heart was beating hard but slow this morning. Also noted some very brief episode of minimal chest tightness this morning that felt like he was being "tazzed." However, no significant chest pain since last admission. No exertional chest pain. He reported he was walking a few days ago and his heart rates went from 160 bpm to 40 bpm suddenly and he was short of breath with this. However, no other shortness of breath. No orthopnea, PND, or edema. No recent fevers or illnesses. He reported a dry cough that he is not sure if it is from allergies or his Benazepril. No abnormal bleeding in urine or stool.   Hospital Course     Consultants: None  Patient was admitted with symptomatic bradycardia as above. Intermittent 2:1 AV block noted on EKG and telemetry. Electrolytes normal. TSH normal during recent admission. He underwent dual chamber PPM implantation on 08/06/2020 with Medtronic generator (model Azure XT DR MRI SureScan [id number], serial [id number]). Patient tolerated the procedure well. Chest x-ray following procedure showed no evidence of pneumothorax. He seen by Dr. Quentin Ore and was felt to be stable for discharge on same day following procedure. Continue home medications. Will send message to EP scheduler to help arrange wound check and follow-up visit.  Of note, patient was initially admitted under inpatient status but patient did not end up staying 2 nights. Therefore, "potential code 78" order was placed at discharge.  Did the patient have an acute coronary syndrome (MI, NSTEMI, STEMI, etc) this admission?:  No                               Did the patient have a percutaneous coronary intervention (stent / angioplasty)?:  No.       _____________  Discharge Vitals Blood pressure (!) 151/79, pulse 83, temperature 97.6 F (36.4 C), temperature source Oral, resp. rate 17, weight 69.7 kg, SpO2 98 %.  Filed  Weights   08/05/20 1640  Weight: 69.7 kg    Labs & Radiologic Studies    CBC Recent Labs    08/05/20 1553  WBC 8.0  NEUTROABS 6.0  HGB 14.7  HCT 44.2  MCV 93.8  PLT 053   Basic Metabolic Panel Recent Labs    08/05/20 1459 08/06/20 0136  NA 137 139  K 4.4 3.9  CL 103 105  CO2 27 27  GLUCOSE 221* 123*  BUN 20 16  CREATININE 0.96 0.93  CALCIUM 9.3 8.9  MG 1.8  --    Liver Function Tests No results for input(s): AST, ALT, ALKPHOS, BILITOT, PROT, ALBUMIN in the last 72 hours. No results for input(s): LIPASE, AMYLASE in the last 72 hours. High Sensitivity Troponin:   Recent Labs  Lab 07/23/20 1220 07/23/20 1423 07/24/20 0522 08/05/20 1459 08/05/20 1629  TROPONINIHS 11 9 6 5 4     BNP Invalid input(s): POCBNP D-Dimer No results for input(s): DDIMER in the last 72 hours. Hemoglobin A1C No results for input(s): HGBA1C in the last 72 hours. Fasting Lipid Panel No results for input(s): CHOL, HDL, LDLCALC, TRIG,  CHOLHDL, LDLDIRECT in the last 72 hours. Thyroid Function Tests No results for input(s): TSH, T4TOTAL, T3FREE, THYROIDAB in the last 72 hours.  Invalid input(s): FREET3 _____________  DG Chest 2 View  Result Date: 08/06/2020 CLINICAL DATA:  Cardiac device in place. EXAM: CHEST - 2 VIEW COMPARISON:  Radiograph yesterday. FINDINGS: New left-sided pacemaker with leads projecting over the right atrium and ventricle. No pneumothorax. Heart is normal in size. No pleural effusion, focal airspace disease, or pulmonary edema. Stable osseous structures. IMPRESSION: New left-sided pacemaker with leads projecting over the right atrium and ventricle. No pneumothorax. Electronically Signed   By: Keith Rake M.D.   On: 08/06/2020 17:31   DG Chest 2 View  Result Date: 08/05/2020 CLINICAL DATA:  Chest pain history of diabetes and bradycardia EXAM: CHEST - 2 VIEW COMPARISON:  July 23, 2020 FINDINGS: The heart size and mediastinal contours are within normal limits.  Cardiac monitoring device projects over the left paramedian hemithorax. Bibasilar subsegmental atelectasis. No focal consolidation. No pleural effusion. No pneumothorax. The visualized skeletal structures are unremarkable. IMPRESSION: No acute cardiopulmonary disease. Electronically Signed   By: Dahlia Bailiff MD   On: 08/05/2020 16:24   DG Chest 2 View  Result Date: 07/23/2020 CLINICAL DATA:  Chest tightness, shortness of breath, nausea and diaphoresis. EXAM: CHEST - 2 VIEW COMPARISON:  June 15, 2018 FINDINGS: Cardiomediastinal silhouette is normal. Mediastinal contours appear intact. There is no evidence of focal airspace consolidation, pleural effusion or pneumothorax. Osseous structures are without acute abnormality. Soft tissues are grossly normal. IMPRESSION: No active cardiopulmonary disease. Electronically Signed   By: Fidela Salisbury M.D.   On: 07/23/2020 13:54   ECHOCARDIOGRAM COMPLETE  Result Date: 07/24/2020    ECHOCARDIOGRAM REPORT   Patient Name:   RAIJON LINDFORS Date of Exam: 07/24/2020 Medical Rec #:  [id number]         Height:       72.0 in Accession #:    [id number]        Weight:       156.7 lb Date of Birth:  16-Sep-1955         BSA:          1.921 m Patient Age:    73 years          BP:           133/85 mmHg Patient Gender: M                 HR:           86 bpm. Exam Location:  Inpatient Procedure: 2D Echo, Cardiac Doppler and Color Doppler Indications:    Chest pain  History:        Patient has no prior history of Echocardiogram examinations.                 Risk Factors:Hypertension and Diabetes.  Sonographer:    Luisa Hart RDCS Referring Phys: [id number] Lequita Halt  Sonographer Comments: No cardiac surgery or procedure history in chart IMPRESSIONS  1. Left ventricular ejection fraction, by estimation, is 65 to 70%. Left ventricular ejection fraction by 2D MOD biplane is 67.4 %. The left ventricle has normal function. The left ventricle has no regional wall motion abnormalities.  Left ventricular diastolic parameters are consistent with Grade I diastolic dysfunction (impaired relaxation).  2. Right ventricular systolic function is normal. The right ventricular size is normal. There is normal pulmonary artery systolic pressure. The estimated right ventricular systolic pressure is  20.6 mmHg.  3. The mitral valve is grossly normal. Trivial mitral valve regurgitation.  4. The aortic valve is tricuspid. Aortic valve regurgitation is trivial. Mild aortic valve sclerosis is present, with no evidence of aortic valve stenosis. Aortic regurgitation PHT measures 518 msec.  5. The inferior vena cava is normal in size with greater than 50% respiratory variability, suggesting right atrial pressure of 3 mmHg. Comparison(s): No prior Echocardiogram. FINDINGS  Left Ventricle: Left ventricular ejection fraction, by estimation, is 65 to 70%. Left ventricular ejection fraction by 2D MOD biplane is 67.4 %. The left ventricle has normal function. The left ventricle has no regional wall motion abnormalities. The left ventricular internal cavity size was normal in size. There is no left ventricular hypertrophy. Left ventricular diastolic parameters are consistent with Grade I diastolic dysfunction (impaired relaxation). Indeterminate filling pressures. Right Ventricle: The right ventricular size is normal. No increase in right ventricular wall thickness. Right ventricular systolic function is normal. There is normal pulmonary artery systolic pressure. The tricuspid regurgitant velocity is 2.10 m/s, and  with an assumed right atrial pressure of 3 mmHg, the estimated right ventricular systolic pressure is 64.6 mmHg. Left Atrium: Left atrial size was normal in size. Right Atrium: Right atrial size was normal in size. Pericardium: There is no evidence of pericardial effusion. Mitral Valve: The mitral valve is grossly normal. Trivial mitral valve regurgitation. MV peak gradient, 8.8 mmHg. The mean mitral valve gradient  is 1.0 mmHg. Tricuspid Valve: The tricuspid valve is grossly normal. Tricuspid valve regurgitation is trivial. Aortic Valve: The aortic valve is tricuspid. Aortic valve regurgitation is trivial. Aortic regurgitation PHT measures 518 msec. Mild aortic valve sclerosis is present, with no evidence of aortic valve stenosis. Aortic valve mean gradient measures 4.0 mmHg. Aortic valve peak gradient measures 6.9 mmHg. Aortic valve area, by VTI measures 3.70 cm. Pulmonic Valve: The pulmonic valve was normal in structure. Pulmonic valve regurgitation is not visualized. Aorta: The aortic root and ascending aorta are structurally normal, with no evidence of dilitation. Venous: The inferior vena cava is normal in size with greater than 50% respiratory variability, suggesting right atrial pressure of 3 mmHg. IAS/Shunts: No atrial level shunt detected by color flow Doppler.  LEFT VENTRICLE PLAX 2D                        Biplane EF (MOD) LVIDd:         3.70 cm         LV Biplane EF:   Left LVIDs:         1.80 cm                          ventricular LV PW:         1.20 cm                          ejection LV IVS:        1.00 cm                          fraction by LVOT diam:     2.50 cm                          2D MOD LV SV:         124  biplane is LV SV Index:   65                               67.4 %. LVOT Area:     4.91 cm                                Diastology                                LV e' medial:    8.49 cm/s LV Volumes (MOD)               LV E/e' medial:  17.6 LV vol d, MOD    67.2 ml       LV e' lateral:   20.40 cm/s A2C:                           LV E/e' lateral: 7.3 LV vol d, MOD    73.5 ml A4C: LV vol s, MOD    19.8 ml A2C: LV vol s, MOD    26.7 ml A4C: LV SV MOD A2C:   47.4 ml LV SV MOD A4C:   73.5 ml LV SV MOD BP:    47.8 ml LEFT ATRIUM             Index       RIGHT ATRIUM           Index LA diam:        2.90 cm 1.51 cm/m  RA Area:     12.90 cm LA Vol (A2C):   27.5 ml 14.32 ml/m  RA Volume:   26.20 ml  13.64 ml/m LA Vol (A4C):   34.1 ml 17.75 ml/m LA Biplane Vol: 33.2 ml 17.29 ml/m  AORTIC VALVE                   PULMONIC VALVE AV Area (Vmax):    4.20 cm    PV Vmax:       1.03 m/s AV Area (Vmean):   3.59 cm    PV Vmean:      66.000 cm/s AV Area (VTI):     3.70 cm    PV VTI:        0.229 m AV Vmax:           131.00 cm/s PV Peak grad:  4.2 mmHg AV Vmean:          98.000 cm/s PV Mean grad:  2.0 mmHg AV VTI:            0.336 m AV Peak Grad:      6.9 mmHg AV Mean Grad:      4.0 mmHg LVOT Vmax:         112.00 cm/s LVOT Vmean:        71.600 cm/s LVOT VTI:          0.253 m LVOT/AV VTI ratio: 0.75 AI PHT:            518 msec  AORTA Ao Root diam: 3.40 cm Ao Asc diam:  3.10 cm MITRAL VALVE                TRICUSPID VALVE MV Area (PHT): 7.99 cm     TR Peak grad:  17.6 mmHg MV Area VTI:   2.79 cm     TR Vmax:        210.00 cm/s MV Peak grad:  8.8 mmHg MV Mean grad:  1.0 mmHg     SHUNTS MV Vmax:       1.48 m/s     Systemic VTI:  0.25 m MV Vmean:      51.2 cm/s    Systemic Diam: 2.50 cm MV Decel Time: 95 msec MV E velocity: 149.00 cm/s MV A velocity: 70.40 cm/s MV E/A ratio:  2.12 Lyman Bishop MD Electronically signed by Lyman Bishop MD Signature Date/Time: 07/24/2020/3:27:34 PM    Final    Disposition   Patient is being discharged home today in good condition.  Follow-up Plans & Appointments     Follow-up Information    Vickie Epley, MD Follow up.   Specialties: Cardiology, Radiology Why: Our office will call you to arrange follow-up appointments. Contact information: [address] [place] [address] [phone]              Discharge Instructions    Diet - low sodium heart healthy   Complete by: As directed    Increase activity slowly   Complete by: As directed       Discharge Medications   Allergies as of 08/06/2020   No Known Allergies     Medication List    TAKE these medications   acetaminophen 500 MG tablet Commonly known as:  TYLENOL Take 1,000 mg by mouth every 6 (six) hours as needed for mild pain.   aspirin 81 MG EC tablet Take 1 tablet (81 mg total) by mouth daily. Swallow whole.   atorvastatin 20 MG tablet Commonly known as: LIPITOR TAKE 1 TABLET BY MOUTH EVERY DAY   benazepril 40 MG tablet Commonly known as: LOTENSIN Take 1 tablet (40 mg total) by mouth daily.   blood glucose meter kit and supplies Kit Dispense based on patient and insurance preference. Use up to four times daily as directed.   cetirizine 10 MG tablet Commonly known as: ZYRTEC Take 10 mg by mouth daily as needed for allergies.   glucose blood test strip Commonly known as: OneTouch Verio Use as instructed   multivitamin capsule Take 1 capsule by mouth daily.   OneTouch Delica Lancets Fine Misc 1 each by Does not apply route 2 (two) times daily.   Synjardy XR 12.08-998 MG Tb24 Generic drug: Empagliflozin-metFORMIN HCl ER TAKE 1 TABLET BY MOUTH TWICE A DAY   tadalafil 20 MG tablet Commonly known as: CIALIS Take 1 tablet (20 mg total) by mouth every other day as needed for erectile dysfunction.   Trulicity 1.5 HY/0.7PX Sopn Generic drug: Dulaglutide INJECT 1.5 MG INTO THE SKIN ONCE A WEEK.          Outstanding Labs/Studies   N/A.  Duration of Discharge Encounter   Greater than 30 minutes including physician time.  Signed, Darreld Mclean, PA-C 08/06/2020, 6:25 PM

## 2020-08-06 NOTE — Progress Notes (Signed)
EP Progress Note  Patient Name: Justin [name] Date of Encounter: 08/06/2020  [facility] Cardiologist: Little Ishikawa, MD   Subjective   NAEO. Admitted yesterday. Intermittent 2:1 AV Block episodes overnight.  Inpatient Medications    Scheduled Meds: . aspirin EC  81 mg Oral Daily  . atorvastatin  20 mg Oral Daily  . benazepril  40 mg Oral Daily  . chlorhexidine  60 mL Topical Once  . Chlorhexidine Gluconate Cloth  6 each Topical Q0600  . enoxaparin (LOVENOX) injection  40 mg Subcutaneous Q24H  . gentamicin irrigation  80 mg Irrigation On Call  . insulin aspart  0-15 Units Subcutaneous TID WC  . mupirocin ointment  1 application Nasal BID  . off the beat book   Does not apply Once  . the sensuous heart book   Does not apply Once  . you have a pacemaker book   Does not apply Once   Continuous Infusions: .  ceFAZolin (ANCEF) IV     PRN Meds: acetaminophen, nitroGLYCERIN, ondansetron (ZOFRAN) IV   Vital Signs    Vitals:   08/05/20 1600 08/05/20 1640 08/05/20 1945 08/06/20 0656  BP: (!) 152/75 (!) 156/72 139/78 (!) 145/78  Pulse: 84 84 96 85  Resp:   18 18  Temp:  97.7 F (36.5 C) 98.1 F (36.7 C) 97.8 F (36.6 C)  TempSrc:  Oral Oral Oral  SpO2: 98% 100% 95% 99%  Weight:  69.7 kg      Intake/Output Summary (Last 24 hours) at 08/06/2020 0746 Last data filed at 08/05/2020 1737 Gross per 24 hour  Intake --  Output 550 ml  Net -550 ml   Last 3 Weights 08/05/2020 08/04/2020 08/01/2020  Weight (lbs) 153 lb 9.6 oz 163 lb 6.4 oz 163 lb  Weight (kg) 69.673 kg 74.118 kg 73.936 kg      Telemetry    2:1 AVblock and sinus rhythm. No evidence of CHB. - Personally Reviewed  ECG    Reviewed. - Personally Reviewed  Physical Exam   GEN: No acute distress.   Neck: No JVD Cardiac: RRR, no murmurs, rubs, or gallops.  Respiratory: Clear to auscultation bilaterally. GI: Soft, nontender, non-distended  MS: No edema; No deformity. Neuro:  Nonfocal   Psych: Normal affect   Labs    High Sensitivity Troponin:   Recent Labs  Lab 07/23/20 1220 07/23/20 1423 07/24/20 0522 08/05/20 1459 08/05/20 1629  TROPONINIHS 11 9 6 5 4       Chemistry Recent Labs  Lab 08/05/20 1459 08/06/20 0136  NA 137 139  K 4.4 3.9  CL 103 105  CO2 27 27  GLUCOSE 221* 123*  BUN 20 16  CREATININE 0.96 0.93  CALCIUM 9.3 8.9  GFRNONAA >60 >60  ANIONGAP 7 7     Hematology Recent Labs  Lab 08/05/20 1553  WBC 8.0  RBC 4.71  HGB 14.7  HCT 44.2  MCV 93.8  MCH 31.2  MCHC 33.3  RDW 13.2  PLT 260    BNPNo results for input(s): BNP, PROBNP in the last 168 hours.   DDimer No results for input(s): DDIMER in the last 168 hours.   Radiology    DG Chest 2 View  Result Date: 08/05/2020 CLINICAL DATA:  Chest pain history of diabetes and bradycardia EXAM: CHEST - 2 VIEW COMPARISON:  July 23, 2020 FINDINGS: The heart size and mediastinal contours are within normal limits. Cardiac monitoring device projects over the left paramedian hemithorax. Bibasilar subsegmental atelectasis.  No focal consolidation. No pleural effusion. No pneumothorax. The visualized skeletal structures are unremarkable. IMPRESSION: No acute cardiopulmonary disease. Electronically Signed   By: Maudry Mayhew MD   On: 08/05/2020 16:24    Cardiac Studies   No new   Assessment & Plan    65yo man with symptomatic 2:1 av block admitted for permanent pacemaker. He has not experienced syncope.  Plan for pacemaker today. Device will be a dual chamber permanent pacemaker.  Keep NPO.  Risks, benefits, alternatives to PPM implantation were discussed in detail with the patient today. The patient understands that the risks include but are not limited to bleeding, infection, pneumothorax, perforation, tamponade, vascular damage, renal failure, MI, stroke, death, and lead dislodgement and wishes to proceed.        For questions or updates, please contact [facility] Please  consult [email or web address] for contact info under        Signed, Lanier Prude, MD  08/06/2020, 7:46 AM

## 2020-08-07 ENCOUNTER — Telehealth: Payer: Self-pay

## 2020-08-07 ENCOUNTER — Encounter (HOSPITAL_COMMUNITY): Payer: Self-pay | Admitting: Cardiology

## 2020-08-07 MED FILL — Lidocaine HCl Local Inj 1%: INTRAMUSCULAR | Qty: 45 | Status: AC

## 2020-08-07 NOTE — Telephone Encounter (Signed)
Follow-up after same day discharge: Implant date: 08/06/20 MD: Dr. Steffanie Dunn Device: Medtronic PPM Location: Left Chest   Wound check visit: 08/17/20 90 day MD follow-up: 11/10/20  Remote Transmission received:Yes- [facility]  Dressing removed: Not yet.  Pt instructed to remove dressing and Sling today.    Activity restrictions reviewed.    Pt reports pain level tolerable, managing with tylenol as instructed.

## 2020-08-07 NOTE — Telephone Encounter (Cosign Needed)
Transition Care Management Unsuccessful Follow-up Telephone Call  Date of discharge and from where:  [place] [facility] 08/06/20  Attempts:  2nd Attempt  Reason for unsuccessful TCM follow-up call:  Left voice message

## 2020-08-08 ENCOUNTER — Telehealth: Payer: Self-pay

## 2020-08-08 NOTE — Telephone Encounter (Cosign Needed)
Transition Care Management Unsuccessful Follow-up Telephone Call  Date of discharge and from where:  [place] [facility] 08/06/20  Attempts:  3rd Attempt  Reason for unsuccessful TCM follow-up call:  No answer/busy

## 2020-08-15 NOTE — Progress Notes (Signed)
Cardiology Office Note:    Date:  08/16/2020   ID:  [name], DOB [birth year], MRN [id number]  PCP:  Marin Olp, MD  Cardiologist:  Donato Heinz, MD  Electrophysiologist:  Vickie Epley, MD   Referring MD: Marin Olp, MD   Chief Complaint  Patient presents with  . Follow-up    Post hospital.  . Bradycardia    History of Present Illness:    Justin [name] is a 65 y.o. 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  He was admitted from 4/10 through 07/25/2020 with chest pain and found to have intermittent 2-1 AV block with heart rates dropping to 40s.  Chest pain atypical, troponins negative.  EP was consulted and recommended holding off on pacemaker and obtaining outpatient monitor.  Monitor showed multiple episodes of Mobitz 2 AV block and high-grade AV block.  He reported worsening symptoms and he was admitted on 4/23 for PPM placement.  He underwent pacemaker placement was discharged on 4/24.  Echocardiogram 07/24/2020 showed LVEF 65 to 41%, grade 1 diastolic dysfunction, normal RV function, no significant valvular disease.  Since discharge from the hospital, he reports that he is feeling much better.  Lightheadedness has significantly improved, reports that has had few dizzy spells when he sits up but otherwise denies any symptoms.  Denies any syncope.  No chest pain or dyspnea.  Denies any palpitations or lower extremity edema.  He has been walking about every day for 1 to 2 miles.  Denies any exertional symptoms.   Past Medical History:  Diagnosis Date  . Allergy   . Diabetes mellitus 2007  . ED (erectile dysfunction)    sildenafil 100 mg sparingly  . History of colonic polyps   . Hyperlipidemia associated with type 2 diabetes mellitus ([place])   . Hypertension   . PROSTATITIS, ACUTE 12/02/2009   Qualifier: Diagnosis of  By: Silvio Pate MD,  [facility]     Past Surgical History:  Procedure Laterality Date  . None    . PACEMAKER IMPLANT N/A 08/06/2020   Procedure: PACEMAKER IMPLANT;  Surgeon: Vickie Epley, MD;  Location: [facility];  Service: Cardiovascular;  Laterality: N/A;    Current Medications: Current Meds  Medication Sig  . acetaminophen (TYLENOL) 500 MG tablet Take 1,000 mg by mouth every 6 (six) hours as needed for mild pain.  Marland Kitchen aspirin EC 81 MG EC tablet Take 1 tablet (81 mg total) by mouth daily. Swallow whole.  Marland Kitchen atorvastatin (LIPITOR) 20 MG tablet TAKE 1 TABLET BY MOUTH EVERY DAY (Patient taking differently: Take 20 mg by mouth daily.)  . benazepril (LOTENSIN) 40 MG tablet Take 1 tablet (40 mg total) by mouth daily.  . blood glucose meter kit and supplies KIT Dispense based on patient and insurance preference. Use up to four times daily as directed.  . cetirizine (ZYRTEC) 10 MG tablet Take 10 mg by mouth daily as needed for allergies.  Marland Kitchen glucose blood (ONETOUCH VERIO) test strip Use as instructed  . Multiple Vitamin (MULTIVITAMIN) capsule Take 1 capsule by mouth daily.  Glory Rosebush DELICA LANCETS FINE MISC 1 each by Does not apply route 2 (two) times daily.  Marland Kitchen SYNJARDY XR 12.08-998 MG TB24 TAKE 1 TABLET BY MOUTH TWICE A DAY (Patient taking differently: Take 1 tablet by mouth 2 (two) times daily.)  . tadalafil (CIALIS) 20 MG tablet Take 1 tablet (  20 mg total) by mouth every other day as needed for erectile dysfunction.  . TRULICITY 1.5 BP/1.0CH SOPN INJECT 1.5 MG INTO THE SKIN ONCE A WEEK. (Patient taking differently: Inject 1.5 mg into the skin once a week.)     Allergies:   Patient has no known allergies.   Social History   Socioeconomic History  . Marital status: Married    Spouse name: Not on file  . Number of children: Not on file  . Years of education: Not on file  . Highest education level: Not on file  Occupational History  . Occupation: Retired  Tobacco Use  . Smoking status: Never  Smoker  . Smokeless tobacco: Never Used  Vaping Use  . Vaping Use: Never used  Substance and Sexual Activity  . Alcohol use: Yes    Comment: socially  . Drug use: No  . Sexual activity: Yes    Comment: wife only  Other Topics Concern  . Not on file  Social History Narrative   Married. Identical twin daughters 41 in 2019- mirror twins- born 75 weeks. Both daughters have CP and are wheelchair bound - both live in [place] - graduated top of their classes and have MBA from [employer]. 1 daughters married- they all live together.       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       Hobbies: travel, walks and hikes, skiing       Social Determinants of Health   Financial Resource Strain: Not on file  Food Insecurity: Not on file  Transportation Needs: Not on file  Physical Activity: Not on file  Stress: Not on file  Social Connections: Not on file     Family History: The patient's family history includes CAD in his father; Cerebral palsy in his daughter and daughter; Dementia in his father; Diabetes in his brother, mother, and paternal grandfather; Hypertension in his brother, mother, and sister; Pneumonia in his maternal grandfather; Prostate cancer in his brother; Stroke in his mother. There is no history of Colon cancer.  ROS:   Please see the history of present illness.     All other systems reviewed and are negative.  EKGs/Labs/Other Studies Reviewed:    The following studies were reviewed today:   EKG:  EKG is  ordered today.  The ekg ordered today demonstrates normal sinus rhythm, rate 85, right bundle branch block  Recent Labs: 07/24/2020: TSH 1.003 08/05/2020: Hemoglobin 14.7; Magnesium 1.8; Platelets 260 08/06/2020: BUN 16; Creatinine, Ser 0.93; Potassium 3.9; Sodium 139  Recent Lipid Panel    Component Value Date/Time   CHOL 128 11/24/2018 1125   TRIG 64.0 11/24/2018 1125   HDL 54.00 11/24/2018 1125   CHOLHDL 2 11/24/2018 1125   VLDL 12.8 11/24/2018  1125   LDLCALC 61 11/24/2018 1125   LDLDIRECT 52.0 07/14/2018 1425    Physical Exam:    VS:  BP 116/60 (BP Location: Left Arm, Patient Position: Sitting, Cuff Size: Normal)   Pulse 85   Ht 6' (1.829 m)   Wt 162 lb (73.5 kg)   BMI 21.97 kg/m     Wt Readings from Last 3 Encounters:  08/16/20 162 lb (73.5 kg)  08/05/20 153 lb 9.6 oz (69.7 kg)  08/04/20 163 lb 6.4 oz (74.1 kg)     GEN: Well nourished, well developed in no acute distress HEENT: Normal NECK: No JVD; No carotid bruits LYMPHATICS: No lymphadenopathy CARDIAC: RRR, no murmurs, rubs, gallops RESPIRATORY:  Clear to auscultation without  rales, wheezing or rhonchi  ABDOMEN: Soft, non-tender, non-distended MUSCULOSKELETAL:  No edema; No deformity  SKIN: Warm and dry NEUROLOGIC:  Alert and oriented x 3 PSYCHIATRIC:  Normal affect   ASSESSMENT:    1. Heart block AV second degree   2. Chest pain of uncertain etiology   3. Essential hypertension   4. Hyperlipidemia, unspecified hyperlipidemia type    PLAN:    Second degree AV block/symptomatic bradycardia: Status post PPM placement on 08/06/2020.  Follows with Dr Quentin Ore in EP  Chest pain.  Recent admission with chest pain, troponins negative.  Echocardiogram 07/24/2020 showed LVEF 65 to 90%, grade 1 diastolic dysfunction, normal RV function, no significant valvular disease. -Denies any further chest pain.  Will continue to monitor, will hold off on any ischemic testing at this time  Hypertension: On benazepril 40 mg daily.  Appears controlled  Z0SP: On Trulicity.  A1c 6.9% on 07/23/2020.  Hyperlipidemia: On atorvastatin 20 mg daily.  LDL 61 on 11/24/2018.  Reports has labs at end of month with PCP  RTC in 6 months   Medication Adjustments/Labs and Tests Ordered: Current medicines are reviewed at length with the patient today.  Concerns regarding medicines are outlined above.  No orders of the defined types were placed in this encounter.  No orders of the defined  types were placed in this encounter.   Patient Instructions  Medication Instructions:  Your physician recommends that you continue on your current medications as directed. Please refer to the Current Medication list given to you today.  *If you need a refill on your cardiac medications before your next appointment, please call your pharmacy*  Follow-Up: At [facility], you and your health needs are our priority.  As part of our continuing mission to provide you with exceptional heart care, we have created designated Provider Care Teams.  These Care Teams include your primary Cardiologist (physician) and Advanced Practice Providers (APPs -  Physician Assistants and Nurse Practitioners) who all work together to provide you with the care you need, when you need it.  We recommend signing up for the patient portal called "MyChart".  Sign up information is provided on this After Visit Summary.  MyChart is used to connect with patients for Virtual Visits (Telemedicine).  Patients are able to view lab/test results, encounter notes, upcoming appointments, etc.  Non-urgent messages can be sent to your provider as well.   To learn more about what you can do with MyChart, go to [email or web address].    Your next appointment:   6 month(s)  The format for your next appointment:   In Person  Provider:   Oswaldo Milian, MD      Signed, Donato Heinz, MD  08/16/2020 5:22 PM    [place]

## 2020-08-16 ENCOUNTER — Ambulatory Visit: Payer: Medicare PPO | Admitting: Cardiology

## 2020-08-16 ENCOUNTER — Other Ambulatory Visit: Payer: Self-pay

## 2020-08-16 ENCOUNTER — Encounter: Payer: Self-pay | Admitting: Cardiology

## 2020-08-16 VITALS — BP 116/60 | HR 85 | Ht 72.0 in | Wt 162.0 lb

## 2020-08-16 DIAGNOSIS — E785 Hyperlipidemia, unspecified: Secondary | ICD-10-CM | POA: Diagnosis not present

## 2020-08-16 DIAGNOSIS — I441 Atrioventricular block, second degree: Secondary | ICD-10-CM

## 2020-08-16 DIAGNOSIS — I10 Essential (primary) hypertension: Secondary | ICD-10-CM | POA: Diagnosis not present

## 2020-08-16 DIAGNOSIS — R079 Chest pain, unspecified: Secondary | ICD-10-CM | POA: Diagnosis not present

## 2020-08-16 NOTE — Telephone Encounter (Signed)
Message sent to [facility] representative [employer]. EOB is not a bill.  In future would recommend patient await bill from monitor company.  Often they will resubmit  claim to insurance company with requested additional information to be covered.

## 2020-08-16 NOTE — Patient Instructions (Signed)

## 2020-08-17 ENCOUNTER — Other Ambulatory Visit: Payer: Self-pay | Admitting: Family Medicine

## 2020-08-17 ENCOUNTER — Ambulatory Visit (INDEPENDENT_AMBULATORY_CARE_PROVIDER_SITE_OTHER): Payer: Medicare PPO | Admitting: Emergency Medicine

## 2020-08-17 DIAGNOSIS — I441 Atrioventricular block, second degree: Secondary | ICD-10-CM | POA: Diagnosis not present

## 2020-08-17 LAB — CUP PACEART INCLINIC DEVICE CHECK
Battery Remaining Longevity: 184 mo
Battery Voltage: 3.23 V
Brady Statistic AP VP Percent: 0 %
Brady Statistic AP VS Percent: 0 %
Brady Statistic AS VP Percent: 0.5 %
Brady Statistic AS VS Percent: 99.5 %
Brady Statistic RA Percent Paced: 0 %
Brady Statistic RV Percent Paced: 0.5 %
Date Time Interrogation Session: 20220505085628
Implantable Lead Implant Date: 20220424
Implantable Lead Implant Date: 20220424
Implantable Lead Location: 753859
Implantable Lead Location: 753862
Implantable Lead Model: 3830
Implantable Lead Model: 5076
Implantable Pulse Generator Implant Date: 20220424
Lead Channel Impedance Value: 399 Ohm
Lead Channel Impedance Value: 475 Ohm
Lead Channel Impedance Value: 532 Ohm
Lead Channel Impedance Value: 703 Ohm
Lead Channel Pacing Threshold Amplitude: 0.75 V
Lead Channel Pacing Threshold Amplitude: 1 V
Lead Channel Pacing Threshold Pulse Width: 0.4 ms
Lead Channel Pacing Threshold Pulse Width: 0.4 ms
Lead Channel Sensing Intrinsic Amplitude: 23.75 mV
Lead Channel Sensing Intrinsic Amplitude: 5.625 mV
Lead Channel Setting Pacing Amplitude: 3.5 V
Lead Channel Setting Pacing Amplitude: 3.5 V
Lead Channel Setting Pacing Pulse Width: 0.4 ms
Lead Channel Setting Sensing Sensitivity: 2 mV

## 2020-08-17 NOTE — Progress Notes (Signed)
Wound check appointment. Steri-strips removed. Wound without redness or edema. Incision edges approximated, wound well healed. Normal device function. Thresholds, sensing, and impedances consistent with implant measurements. Device programmed at 3.5V/auto capture programmed on for extra safety margin until 3 month visit. Histogram distribution appropriate for patient and level of activity. No mode switches or high ventricular rates noted. Next home remote 11/06/20. Patient educated about wound care, arm mobility, lifting restrictions. ROV in 3 months with Dr.Lambert.

## 2020-09-12 ENCOUNTER — Ambulatory Visit (INDEPENDENT_AMBULATORY_CARE_PROVIDER_SITE_OTHER): Payer: Medicare PPO | Admitting: Family Medicine

## 2020-09-12 ENCOUNTER — Encounter: Payer: Self-pay | Admitting: Family Medicine

## 2020-09-12 ENCOUNTER — Other Ambulatory Visit: Payer: Self-pay

## 2020-09-12 VITALS — BP 118/62 | HR 97 | Temp 98.2°F | Wt 161.2 lb

## 2020-09-12 DIAGNOSIS — E119 Type 2 diabetes mellitus without complications: Secondary | ICD-10-CM | POA: Diagnosis not present

## 2020-09-12 DIAGNOSIS — Z23 Encounter for immunization: Secondary | ICD-10-CM | POA: Diagnosis not present

## 2020-09-12 DIAGNOSIS — I441 Atrioventricular block, second degree: Secondary | ICD-10-CM

## 2020-09-12 DIAGNOSIS — E1169 Type 2 diabetes mellitus with other specified complication: Secondary | ICD-10-CM | POA: Diagnosis not present

## 2020-09-12 DIAGNOSIS — E1159 Type 2 diabetes mellitus with other circulatory complications: Secondary | ICD-10-CM

## 2020-09-12 DIAGNOSIS — R351 Nocturia: Secondary | ICD-10-CM

## 2020-09-12 DIAGNOSIS — Z0000 Encounter for general adult medical examination without abnormal findings: Secondary | ICD-10-CM | POA: Diagnosis not present

## 2020-09-12 DIAGNOSIS — I152 Hypertension secondary to endocrine disorders: Secondary | ICD-10-CM

## 2020-09-12 DIAGNOSIS — E785 Hyperlipidemia, unspecified: Secondary | ICD-10-CM | POA: Diagnosis not present

## 2020-09-12 LAB — LIPID PANEL
Cholesterol: 120 mg/dL (ref 0–200)
HDL: 50.6 mg/dL (ref >39.00)
LDL Cholesterol: 57 mg/dL (ref 0–99)
NonHDL: 69.11
Total CHOL/HDL Ratio: 2
Triglycerides: 60 mg/dL (ref 0.0–149.0)
VLDL: 12 mg/dL (ref 0.0–40.0)

## 2020-09-12 LAB — COMPREHENSIVE METABOLIC PANEL
ALT: 12 U/L (ref 0–53)
AST: 12 U/L (ref 0–37)
Albumin: 4.4 g/dL (ref 3.5–5.2)
Alkaline Phosphatase: 49 U/L (ref 39–117)
BUN: 20 mg/dL (ref 6–23)
CO2: 27 mEq/L (ref 19–32)
Calcium: 9.7 mg/dL (ref 8.4–10.5)
Chloride: 104 mEq/L (ref 96–112)
Creatinine, Ser: 0.87 mg/dL (ref 0.40–1.50)
GFR: 90.74 mL/min (ref >60.00)
Glucose, Bld: 121 mg/dL — ABNORMAL HIGH (ref 70–99)
Potassium: 5 mEq/L (ref 3.5–5.1)
Sodium: 140 mEq/L (ref 135–145)
Total Bilirubin: 0.7 mg/dL (ref 0.2–1.2)
Total Protein: 6.7 g/dL (ref 6.0–8.3)

## 2020-09-12 LAB — PSA: PSA: 0.3 ng/mL (ref 0.10–4.00)

## 2020-09-12 MED ORDER — TADALAFIL 20 MG PO TABS: 20.0000 mg | ORAL_TABLET | ORAL | 11 refills | Status: DC | PRN

## 2020-09-12 NOTE — Progress Notes (Signed)
Phone: [phone]   Subjective:  Patient presents today for their annual physical. Chief complaint-noted.   See problem oriented charting- ROS- full  review of systems was completed and negative    The following were reviewed and entered/updated in epic: Past Medical History:  Diagnosis Date  . Allergy   . Diabetes mellitus 2007  . ED (erectile dysfunction)    sildenafil 100 mg sparingly  . History of colonic polyps   . Hyperlipidemia associated with type 2 diabetes mellitus ([place])   . Hypertension   . PROSTATITIS, ACUTE 12/02/2009   Qualifier: Diagnosis of  By: Silvio Pate MD, [facility]    Patient Active Problem List   Diagnosis Date Noted  . Mobitz type 2 second degree AV block 08/04/2020    Priority: High  . Diabetes mellitus without complication ([place]) 26/33/3545    Priority: High  . Hyperlipidemia associated with type 2 diabetes mellitus ([place]) 02/02/2018    Priority: Medium  . Family history of prostate cancer 02/02/2018    Priority: Medium  . Hypertension associated with diabetes ([place]) 12/23/2006    Priority: Medium  . Dupuytren contracture 11/24/2018    Priority: Low  . ED (erectile dysfunction)     Priority: Low  . History of colonic polyps 01/18/2008    Priority: Low  . Allergic rhinitis 11/07/2006    Priority: Low  . Symptomatic bradycardia 08/06/2020  . S/P placement of cardiac pacemaker 08/06/2020  . 2nd degree AV block 08/05/2020  . Chest pain at rest 07/23/2020  . RBBB 07/14/2018   Past Surgical History:  Procedure Laterality Date  . None    . PACEMAKER IMPLANT N/A 08/06/2020   Procedure: PACEMAKER IMPLANT;  Surgeon: Vickie Epley, MD;  Location: [facility];  Service: Cardiovascular;  Laterality: N/A;    Family History  Problem Relation Age of Onset  . Dementia Father   . CAD Father        CABG mid 61s, never smoker  . Hypertension Mother   . Stroke Mother        28, first stroke 37. never smoker  . Diabetes Mother        in late  life  . Hypertension Sister   . Diabetes Brother   . Hypertension Brother   . Prostate cancer Brother        10  . Cerebral palsy Daughter   . Cerebral palsy Daughter   . Pneumonia Maternal Grandfather        died from this  . Diabetes Paternal Grandfather   . Colon cancer Neg Hx     Medications- reviewed and updated Current Outpatient Medications  Medication Sig Dispense Refill  . acetaminophen (TYLENOL) 500 MG tablet Take 1,000 mg by mouth every 6 (six) hours as needed for mild pain.    Marland Kitchen aspirin EC 81 MG EC tablet Take 1 tablet (81 mg total) by mouth daily. Swallow whole. 360 tablet 0  . atorvastatin (LIPITOR) 20 MG tablet TAKE 1 TABLET BY MOUTH EVERY DAY (Patient taking differently: Take 20 mg by mouth daily.) 90 tablet 3  . benazepril (LOTENSIN) 40 MG tablet Take 1 tablet (40 mg total) by mouth daily. 90 tablet 0  . blood glucose meter kit and supplies KIT Dispense based on patient and insurance preference. Use up to four times daily as directed. 1 each 3  . cetirizine (ZYRTEC) 10 MG tablet Take 10 mg by mouth daily as needed for allergies.    Marland Kitchen glucose blood (ONETOUCH VERIO)  test strip Use as instructed 100 each 12  . Multiple Vitamin (MULTIVITAMIN) capsule Take 1 capsule by mouth daily.    Glory Rosebush DELICA LANCETS FINE MISC 1 each by Does not apply route 2 (two) times daily. 100 each 3  . SYNJARDY XR 12.08-998 MG TB24 TAKE 1 TABLET BY MOUTH TWICE A DAY (Patient taking differently: Take 1 tablet by mouth 2 (two) times daily.) 180 tablet 3  . TRULICITY 1.5 QJ/3.3LK SOPN INJECT 1.5 MG INTO THE SKIN ONCE A WEEK. 1.5 mL 3  . tadalafil (CIALIS) 20 MG tablet Take 1 tablet (20 mg total) by mouth every other day as needed for erectile dysfunction. 10 tablet 11   No current facility-administered medications for this visit.    Allergies-reviewed and updated No Known Allergies  Social History   Social History Narrative   Married. Identical twin daughters 80 in 2019- mirror twins-  born 60 weeks. Both daughters have CP and are wheelchair bound - both live in [place] - graduated top of their classes and have MBA from [employer]. 1 daughters married- they all live together.       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       Hobbies: travel, walks and hikes, skiing       Objective  Objective:  BP 118/62   Pulse 97   Temp 98.2 F (36.8 C) (Temporal)   Wt 161 lb 3.2 oz (73.1 kg)   SpO2 93%   BMI 21.86 kg/m  Gen: NAD, resting comfortably HEENT: Mucous membranes are moist. Oropharynx normal. TMs normal. Nasal turbinates normal Neck: no thyromegaly, no carotid bruits. No cervical lymphadenopathy. CV: RRR no murmurs rubs or gallops Lungs: CTAB no crackles, wheeze, rhonchi Abdomen: soft/nontender/nondistended/normal bowel sounds. No rebound or guarding.  Ext: no edema Skin: warm, dry Neuro: grossly normal, moves all extremities, PERRLA Rectal: normal tone, normal sized prostate, no masses or tenderness    Diabetic Foot Exam - Simple   Simple Foot Form Diabetic Foot exam was performed with the following findings: Yes 09/12/2020  1:18 PM  Visual Inspection No deformities, no ulcerations, no other skin breakdown bilaterally: Yes Sensation Testing Intact to touch and monofilament testing bilaterally: Yes Pulse Check Posterior Tibialis and Dorsalis pulse intact bilaterally: Yes Comments        Assessment and Plan  65 y.o. male presenting for annual physical.  Health Maintenance counseling: 1. Anticipatory guidance: Patient counseled regarding regular dental exams -q6 months, eye exams -yearly,  avoiding smoking and second hand smoke, limiting alcohol to 2 beverages per day- well under.   2. Risk factor reduction:  Advised patient of need for regular exercise and diet rich and fruits and vegetables to reduce risk of heart attack and stroke. Exercise- walking on days that permit weather wise- He has slowed down but still continues after pace maker in  place. Diet- reasonably balanced diet.  Wt Readings from Last 3 Encounters:  09/12/20 161 lb 3.2 oz (73.1 kg)  08/16/20 162 lb (73.5 kg)  08/05/20 153 lb 9.6 oz (69.7 kg)  3. Immunizations/screenings/ancillary studies- discussed Prevnar 20- planned today, discussed COVID-19 vaccination #4- He was considering; plans to do this before his cruise in July  Immunization History  Administered Date(s) Administered  . Influenza Inj Mdck Quad With Preservative 02/13/2019  . Influenza Split 12/26/2010, 01/02/2012  . Influenza Whole 04/15/2005, 01/18/2008, 02/13/2010  . Influenza,inj,Quad PF,6+ Mos 01/04/2013, 02/22/2014, 02/21/2015, 01/29/2016, 01/06/2017, 02/02/2018  . PFIZER(Purple Top)SARS-COV-2 Vaccination 07/05/2019, 07/26/2019, 02/09/2020  . PNEUMOCOCCAL  CONJUGATE-20 09/12/2020  . Pneumococcal Polysaccharide-23 02/02/2018  . Tdap 05/04/2012  . Zoster Recombinat (Shingrix) 02/15/2020, 05/19/2020  4. Prostate cancer screening- update PSA and as long as PSA trend lo risk.  He prefers to do rectal exams.  Nocturia stable at up to once a night-often none.  Family history of prostate cancer in brother at age 55-he is severely debilitated Lab Results  Component Value Date   PSA 0.41 11/24/2018   PSA 0.32 02/21/2015   PSA 0.92 02/15/2014   5. Colon cancer screening - last colonoscopy 2014- Dr. Ardis Hughs confirmed 10-year colonoscopy repeat. 6. Skin cancer screening- no dermatologist. advised regular sunscreen use- usually in shaded areas. Denies worrisome, changing, or new skin lesions.  7. Never smoker. 8. STD screening - monogamous  #Social Updates: He has a trip/vacation coming up in July.  Status of chronic or acute concerns   #Second-degree AV block/symptomatic bradycardia-lead to postpartum pacemaker placement 08/06/2020.  Follows with Dr. Gardiner Rhyme primary cardiologist and Dr. Quentin Ore of electrophysiology  # Diabetes-A1c typically under 7 S: Medication: Synjardy extended release (Jardiance  and metformin) 12.08-998 mg extended release twice daily, Trulicity 1.5 mg CBGs- does not check. Exercise and diet- please read above (Risk Factor Reduction). A/P: Hopefully stable-too soon for A1c repeat-can recheck at next visit -Foot exam today   #hypertension S: medication: Benazepril 20 mg BP Readings from Last 3 Encounters:  09/12/20 118/62  08/16/20 116/60  08/06/20 (!) 151/79  A/P:  Stable. Continue current medications.     #hyperlipidemia S: Medication: Atorvastatin 20 mg daily, also takes aspirin 81 mg for primary prevention Lab Results  Component Value Date   CHOL 128 11/24/2018   HDL 54.00 11/24/2018   LDLCALC 61 11/24/2018   LDLDIRECT 52.0 07/14/2018   TRIG 64.0 11/24/2018   CHOLHDL 2 11/24/2018  A/P: Hopefully stable-update lipids- discussed potentially stopping aspirin 81 mg-I will run by cardiology   #Erectile Dysfunction- Sildenafil 100 mg prn- mild improvement. Cialis similar mild improvement- could discuss possible urology referral   Recommended follow EU:MPNTIR in about 6 months (around 03/14/2021) for follow up- or sooner if needed. Future Appointments  Date Time Provider [place]  11/06/2020  7:00 AM [facility] DEVICE REMOTES [facility]  11/10/2020  2:45 PM Vickie Epley, MD [facility]  02/05/2021  7:00 AM [facility] DEVICE REMOTES [facility]  05/07/2021  7:00 AM [facility] DEVICE REMOTES [facility]  08/06/2021  7:00 AM [facility] DEVICE REMOTES [facility]  11/05/2021  7:00 AM [facility] DEVICE REMOTES [facility]  02/04/2022  7:00 AM [facility] DEVICE REMOTES [facility]   Lab/Order associations:  not fasting   ICD-10-CM   1. Preventative health care  Z00.00   2. Mobitz type 2 second degree AV block  I44.1   3. Diabetes mellitus without complication ([place])  W43.1   4. Hyperlipidemia associated with type 2 diabetes mellitus ([facility])  E11.69  Comprehensive metabolic panel   V40.0 Lipid panel  5. Hypertension associated with diabetes ([place])  E11.59    I15.2   6. Nocturia  R35.1 PSA  7. Need for pneumococcal vaccine  Z23 Pneumococcal conjugate vaccine 20-valent (Prevnar 20)   Meds ordered this encounter  Medications  . tadalafil (CIALIS) 20 MG tablet    Sig: Take 1 tablet (20 mg total) by mouth every other day as needed for erectile dysfunction.    Dispense:  10 tablet    Refill:  11   I,Harris Phan,acting as a scribe for Garret Reddish, MD.,have documented all relevant documentation on  the behalf of Garret Reddish, MD,as directed by  Garret Reddish, MD while in the presence of Garret Reddish, MD.   I, Garret Reddish, MD, have reviewed all documentation for this visit. The documentation on 09/12/20 for the exam, diagnosis, procedures, and orders are all accurate and complete.   Return precautions advised.  Garret Reddish, MD

## 2020-09-12 NOTE — Patient Instructions (Addendum)
Prevnar 20 today   Please stop by lab before you go If you have mychart- we will send your results within 3 business days of [place] receiving them.  If you do not have mychart- we will call you about results within 5 business days of [place] receiving them.  *please also note that you will see labs on mychart as soon as they post. I will later go in and write notes on them- will say "notes from Dr. Durene Cal"  Recommended follow up: Return in about 6 months (around 03/14/2021) for follow up- or sooner if needed.

## 2020-09-18 ENCOUNTER — Telehealth: Payer: Self-pay

## 2020-09-18 NOTE — Telephone Encounter (Signed)
Called and spoke with pt and pt is in agreement to stop Aspirin 81mg .

## 2020-09-18 NOTE — Telephone Encounter (Signed)
-----   Message from Shelva Majestic, MD sent at 09/13/2020  1:37 PM EDT ----- Team please convert this to a phone note if possible.  Please inform patient he can stop his aspirin and if he agrees remove from his medication list ----- Message ----- From: Little Ishikawa, MD Sent: 09/13/2020   6:47 AM EDT To: Shelva Majestic, MD  Dr Durene Cal, Yes I agree, should be fine to stop Thanks, Thayer Ohm   ----- Message ----- From: Shelva Majestic, MD Sent: 09/12/2020   1:20 PM EDT To: Little Ishikawa, MD  Dr. Bjorn Pippin,   I was considering stopping patient's aspirin 81 mg but I just wanted to make sure with you before I did this-apparently it was started during one of his more recent hospitalizations and I do not see a clear indication other than for primary prevention and at his age I typically would not start it for primary prevention.  Please let me know if I am mistaken  Thanks, Tana Conch

## 2020-11-06 ENCOUNTER — Ambulatory Visit (INDEPENDENT_AMBULATORY_CARE_PROVIDER_SITE_OTHER): Payer: Medicare PPO

## 2020-11-06 DIAGNOSIS — I441 Atrioventricular block, second degree: Secondary | ICD-10-CM | POA: Diagnosis not present

## 2020-11-08 ENCOUNTER — Telehealth: Payer: Self-pay | Admitting: Cardiology

## 2020-11-08 LAB — CUP PACEART REMOTE DEVICE CHECK
Battery Remaining Longevity: 159 mo
Battery Voltage: 3.2 V
Brady Statistic AP VP Percent: 0 %
Brady Statistic AP VS Percent: 0 %
Brady Statistic AS VP Percent: 85.55 %
Brady Statistic AS VS Percent: 14.45 %
Brady Statistic RA Percent Paced: 0 %
Brady Statistic RV Percent Paced: 85.55 %
Date Time Interrogation Session: 20220724212012
Implantable Lead Implant Date: 20220424
Implantable Lead Implant Date: 20220424
Implantable Lead Location: 753859
Implantable Lead Location: 753862
Implantable Lead Model: 3830
Implantable Lead Model: 5076
Implantable Pulse Generator Implant Date: 20220424
Lead Channel Impedance Value: 304 Ohm
Lead Channel Impedance Value: 418 Ohm
Lead Channel Impedance Value: 418 Ohm
Lead Channel Impedance Value: 627 Ohm
Lead Channel Pacing Threshold Amplitude: 0.75 V
Lead Channel Pacing Threshold Amplitude: 0.875 V
Lead Channel Pacing Threshold Pulse Width: 0.4 ms
Lead Channel Pacing Threshold Pulse Width: 0.4 ms
Lead Channel Sensing Intrinsic Amplitude: 25 mV
Lead Channel Sensing Intrinsic Amplitude: 25 mV
Lead Channel Sensing Intrinsic Amplitude: 3.125 mV
Lead Channel Sensing Intrinsic Amplitude: 3.125 mV
Lead Channel Setting Pacing Amplitude: 1.75 V
Lead Channel Setting Pacing Amplitude: 2 V
Lead Channel Setting Pacing Pulse Width: 0.4 ms
Lead Channel Setting Sensing Sensitivity: 2 mV

## 2020-11-08 NOTE — Telephone Encounter (Signed)
I did not need this encounter. °

## 2020-11-09 NOTE — Progress Notes (Signed)
Electrophysiology Office Follow up Visit Note:    Date:  11/10/2020   ID:  Justin [name], DOB June 27, 1955, MRN [id number]  PCP:  Marin Olp, MD  [facility] Cardiologist:  Donato Heinz, MD  [facility] Electrophysiologist:  Vickie Epley, MD    Interval History:    Justin [name] is a 65 y.o. male who presents for a follow up visit after PPM implant 08/05/2020 for symptomatic Mobitz II heart block and symptomatic bradycardia. Since implant he has done well. Has been very active. Saw Dr Gardiner Rhyme on 08/16/2020. Device interrogations since implant show a well functioning pacemaker.  He just returned from a vacation in [place]. He tells me that he is overall doing very well.   Past Medical History:  Diagnosis Date   Allergy    Diabetes mellitus 2007   ED (erectile dysfunction)    sildenafil 100 mg sparingly   History of colonic polyps    Hyperlipidemia associated with type 2 diabetes mellitus ([place])    Hypertension    PROSTATITIS, ACUTE 12/02/2009   Qualifier: Diagnosis of  By: Silvio Pate MD, [facility]     Past Surgical History:  Procedure Laterality Date   None     PACEMAKER IMPLANT N/A 08/06/2020   Procedure: PACEMAKER IMPLANT;  Surgeon: Vickie Epley, MD;  Location: [facility];  Service: Cardiovascular;  Laterality: N/A;    Current Medications: Current Meds  Medication Sig   acetaminophen (TYLENOL) 500 MG tablet Take 1,000 mg by mouth every 6 (six) hours as needed for mild pain.   atorvastatin (LIPITOR) 20 MG tablet TAKE 1 TABLET BY MOUTH EVERY DAY (Patient taking differently: Take 20 mg by mouth daily.)   benazepril (LOTENSIN) 20 MG tablet Take 20 mg by mouth daily.   blood glucose meter kit and supplies KIT Dispense based on patient and insurance preference. Use up to four times daily as directed.   cetirizine (ZYRTEC) 10 MG tablet Take 10 mg by mouth daily as needed for allergies.   glucose blood (ONETOUCH VERIO) test strip Use as  instructed   Multiple Vitamin (MULTIVITAMIN) capsule Take 1 capsule by mouth daily.   ONETOUCH DELICA LANCETS FINE MISC 1 each by Does not apply route 2 (two) times daily.   SYNJARDY XR 12.08-998 MG TB24 TAKE 1 TABLET BY MOUTH TWICE A DAY (Patient taking differently: Take 1 tablet by mouth 2 (two) times daily.)   tadalafil (CIALIS) 20 MG tablet Take 1 tablet (20 mg total) by mouth every other day as needed for erectile dysfunction.   TRULICITY 1.5 LK/4.4WN SOPN INJECT 1.5 MG INTO THE SKIN ONCE A WEEK.     Allergies:   Patient has no known allergies.   Social History   Socioeconomic History   Marital status: Married    Spouse name: Not on file   Number of children: Not on file   Years of education: Not on file   Highest education level: Not on file  Occupational History   Occupation: Retired  Tobacco Use   Smoking status: Never   Smokeless tobacco: Never  Vaping Use   Vaping Use: Never used  Substance and Sexual Activity   Alcohol use: Yes    Comment: socially   Drug use: No   Sexual activity: Yes    Comment: wife only  Other Topics Concern   Not on file  Social History Narrative   Married. Identical twin daughters 63 in 2019- mirror twins- born 63 weeks. Both daughters  have CP and are wheelchair bound - both live in [place] - graduated top of their classes and have MBA from [employer]. 1 daughters married- they all live together.       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       Hobbies: travel, walks and hikes, skiing       Social Determinants of Health   Financial Resource Strain: Not on file  Food Insecurity: Not on file  Transportation Needs: Not on file  Physical Activity: Not on file  Stress: Not on file  Social Connections: Not on file     Family History: The patient's family history includes CAD in his father; Cerebral palsy in his daughter and daughter; Dementia in his father; Diabetes in his brother, mother, and paternal grandfather;  Hypertension in his brother, mother, and sister; Pneumonia in his maternal grandfather; Prostate cancer in his brother; Stroke in his mother. There is no history of Colon cancer.  ROS:   Please see the history of present illness.    All other systems reviewed and are negative.  EKGs/Labs/Other Studies Reviewed:    The following studies were reviewed today: Clinic notes, device interrogations  11/10/2020 in clinic device interrogation personally reviewed Lead parameters stable Battery longevity 13.2 years VP 86.4% AP <0.1% Underlying rhythm is AS 79 with no ventricular escape   EKG:  The ekg ordered today demonstrates atrial sensing, ventricular pacing with a paced QRS of 128m.  Recent Labs: 07/24/2020: TSH 1.003 08/05/2020: Hemoglobin 14.7; Magnesium 1.8; Platelets 260 09/12/2020: ALT 12; BUN 20; Creatinine, Ser 0.87; Potassium 5.0; Sodium 140  Recent Lipid Panel    Component Value Date/Time   CHOL 120 09/12/2020 1335   TRIG 60.0 09/12/2020 1335   HDL 50.60 09/12/2020 1335   CHOLHDL 2 09/12/2020 1335   VLDL 12.0 09/12/2020 1335   LDLCALC 57 09/12/2020 1335   LDLDIRECT 52.0 07/14/2018 1425    Physical Exam:    VS:  BP 132/62   Pulse 75   Ht 6' (1.829 m)   Wt 165 lb (74.8 kg)   SpO2 99%   BMI 22.38 kg/m     Wt Readings from Last 3 Encounters:  11/10/20 165 lb (74.8 kg)  09/12/20 161 lb 3.2 oz (73.1 kg)  08/16/20 162 lb (73.5 kg)     GEN:  Well nourished, well developed in no acute distress HEENT: Normal NECK: No JVD; No carotid bruits LYMPHATICS: No lymphadenopathy CARDIAC: RRR, no murmurs, rubs, gallops. Pacemaker pocket well healed. RESPIRATORY:  Clear to auscultation without rales, wheezing or rhonchi  ABDOMEN: Soft, non-tender, non-distended MUSCULOSKELETAL:  No edema; No deformity  SKIN: Warm and dry NEUROLOGIC:  Alert and oriented x 3 PSYCHIATRIC:  Normal affect   ASSESSMENT:    1. Heart block AV second degree   2. Essential hypertension   3.  Cardiac pacemaker in situ    PLAN:    In order of problems listed above:  1. Heart block AV second degree Doing well after PPM implant. Paced nearly 100% of time now in the ventricle. No underlying rhythm on today's check. Device functioning well.  Continue remote interrogations.  I will plan to see him back in clinic in 1 year or sooner as needed.  2. Essential hypertension At goal today. Continue current medications. He tells me the correct dose of his benazepril is 293mdaily.   3. Cardiac pacemaker in situ See  #1.   Medication Adjustments/Labs and Tests Ordered: Current medicines are reviewed at  length with the patient today.  Concerns regarding medicines are outlined above.  Orders Placed This Encounter  Procedures   EKG 12-Lead   No orders of the defined types were placed in this encounter.    Signed, Lars Mage, MD, [facility], [facility] 11/10/2020 8:34 PM    Electrophysiology [facility]

## 2020-11-10 ENCOUNTER — Other Ambulatory Visit: Payer: Self-pay

## 2020-11-10 ENCOUNTER — Encounter: Payer: Self-pay | Admitting: Cardiology

## 2020-11-10 ENCOUNTER — Ambulatory Visit: Payer: Medicare PPO | Admitting: Cardiology

## 2020-11-10 VITALS — BP 132/62 | HR 75 | Ht 72.0 in | Wt 165.0 lb

## 2020-11-10 DIAGNOSIS — I441 Atrioventricular block, second degree: Secondary | ICD-10-CM

## 2020-11-10 DIAGNOSIS — I10 Essential (primary) hypertension: Secondary | ICD-10-CM | POA: Diagnosis not present

## 2020-11-10 DIAGNOSIS — Z950 Presence of cardiac pacemaker: Secondary | ICD-10-CM | POA: Diagnosis not present

## 2020-11-10 NOTE — Patient Instructions (Addendum)
Medication Instructions:  Your physician recommends that you continue on your current medications as directed. Please refer to the Current Medication list given to you today. *If you need a refill on your cardiac medications before your next appointment, please call your pharmacy*  Lab Work: None ordered. If you have labs (blood work) drawn today and your tests are completely normal, you will receive your results only by: MyChart Message (if you have MyChart) OR A paper copy in the mail If you have any lab test that is abnormal or we need to change your treatment, we will call you to review the results.  Testing/Procedures: None ordered.  Follow-Up: At [facility], you and your health needs are our priority.  As part of our continuing mission to provide you with exceptional heart care, we have created designated Provider Care Teams.  These Care Teams include your primary Cardiologist (physician) and Advanced Practice Providers (APPs -  Physician Assistants and Nurse Practitioners) who all work together to provide you with the care you need, when you need it.  Your next appointment:   Your physician wants you to follow-up in: 9 months with Justin Prude, MD   Remote monitoring is used to monitor your Pacemaker from home. This monitoring reduces the number of office visits required to check your device to one time per year. It allows [place] to keep an eye on the functioning of your device to ensure it is working properly. You are scheduled for a device check from home on 02/05/2021. You may send your transmission at any time that day. If you have a wireless device, the transmission will be sent automatically. After your physician reviews your transmission, you will receive a postcard with your next transmission date.

## 2020-11-30 ENCOUNTER — Other Ambulatory Visit: Payer: Self-pay | Admitting: Family Medicine

## 2020-11-30 NOTE — Progress Notes (Signed)
Remote pacemaker transmission.   

## 2021-02-01 DIAGNOSIS — E119 Type 2 diabetes mellitus without complications: Secondary | ICD-10-CM | POA: Diagnosis not present

## 2021-02-01 DIAGNOSIS — D3131 Benign neoplasm of right choroid: Secondary | ICD-10-CM | POA: Diagnosis not present

## 2021-02-01 LAB — HM DIABETES EYE EXAM

## 2021-02-05 ENCOUNTER — Ambulatory Visit (INDEPENDENT_AMBULATORY_CARE_PROVIDER_SITE_OTHER): Payer: Medicare PPO

## 2021-02-05 DIAGNOSIS — I441 Atrioventricular block, second degree: Secondary | ICD-10-CM | POA: Diagnosis not present

## 2021-02-06 LAB — CUP PACEART REMOTE DEVICE CHECK
Battery Remaining Longevity: 152 mo
Battery Voltage: 3.17 V
Brady Statistic AP VP Percent: 0 %
Brady Statistic AP VS Percent: 0 %
Brady Statistic AS VP Percent: 99.97 %
Brady Statistic AS VS Percent: 0.02 %
Brady Statistic RA Percent Paced: 0 %
Brady Statistic RV Percent Paced: 99.98 %
Date Time Interrogation Session: 20221023233704
Implantable Lead Implant Date: 20220424
Implantable Lead Implant Date: 20220424
Implantable Lead Location: 753859
Implantable Lead Location: 753860
Implantable Lead Model: 3830
Implantable Lead Model: 5076
Implantable Pulse Generator Implant Date: 20220424
Lead Channel Impedance Value: 304 Ohm
Lead Channel Impedance Value: 418 Ohm
Lead Channel Impedance Value: 532 Ohm
Lead Channel Impedance Value: 608 Ohm
Lead Channel Pacing Threshold Amplitude: 0.625 V
Lead Channel Pacing Threshold Amplitude: 1.125 V
Lead Channel Pacing Threshold Pulse Width: 0.4 ms
Lead Channel Pacing Threshold Pulse Width: 0.4 ms
Lead Channel Sensing Intrinsic Amplitude: 25 mV
Lead Channel Sensing Intrinsic Amplitude: 25 mV
Lead Channel Sensing Intrinsic Amplitude: 4.375 mV
Lead Channel Sensing Intrinsic Amplitude: 4.375 mV
Lead Channel Setting Pacing Amplitude: 2 V
Lead Channel Setting Pacing Amplitude: 2.25 V
Lead Channel Setting Pacing Pulse Width: 0.4 ms
Lead Channel Setting Sensing Sensitivity: 2 mV

## 2021-02-13 NOTE — Progress Notes (Signed)
Remote pacemaker transmission.   

## 2021-02-28 LAB — HM DIABETES EYE EXAM

## 2021-03-15 ENCOUNTER — Telehealth (INDEPENDENT_AMBULATORY_CARE_PROVIDER_SITE_OTHER): Payer: Medicare PPO | Admitting: Family Medicine

## 2021-03-15 ENCOUNTER — Encounter: Payer: Self-pay | Admitting: Family Medicine

## 2021-03-15 VITALS — BP 145/88 | Ht 72.0 in | Wt 160.0 lb

## 2021-03-15 DIAGNOSIS — N5201 Erectile dysfunction due to arterial insufficiency: Secondary | ICD-10-CM

## 2021-03-15 DIAGNOSIS — I441 Atrioventricular block, second degree: Secondary | ICD-10-CM | POA: Diagnosis not present

## 2021-03-15 DIAGNOSIS — E785 Hyperlipidemia, unspecified: Secondary | ICD-10-CM

## 2021-03-15 DIAGNOSIS — E1159 Type 2 diabetes mellitus with other circulatory complications: Secondary | ICD-10-CM | POA: Diagnosis not present

## 2021-03-15 DIAGNOSIS — E119 Type 2 diabetes mellitus without complications: Secondary | ICD-10-CM

## 2021-03-15 DIAGNOSIS — E1169 Type 2 diabetes mellitus with other specified complication: Secondary | ICD-10-CM

## 2021-03-15 DIAGNOSIS — I152 Hypertension secondary to endocrine disorders: Secondary | ICD-10-CM | POA: Diagnosis not present

## 2021-03-15 MED ORDER — TRULICITY 1.5 MG/0.5ML ~~LOC~~ SOAJ: 1.5000 mg | SUBCUTANEOUS | 3 refills | Status: DC

## 2021-03-15 MED ORDER — SYNJARDY XR 12.5-1000 MG PO TB24: 1.0000 | ORAL_TABLET | Freq: Two times a day (BID) | ORAL | 3 refills | Status: DC

## 2021-03-15 MED ORDER — ATORVASTATIN CALCIUM 20 MG PO TABS: 20.0000 mg | ORAL_TABLET | Freq: Every day | ORAL | 3 refills | Status: DC

## 2021-03-15 MED ORDER — BENAZEPRIL HCL 20 MG PO TABS: 20.0000 mg | ORAL_TABLET | Freq: Every day | ORAL | 3 refills | Status: DC

## 2021-03-15 NOTE — Progress Notes (Signed)
Phone [phone] Virtual visit via Video note   Subjective:  Chief complaint: Chief Complaint  Patient presents with   Diabetes   Hypertension   Hyperlipidemia    This visit type was conducted due to national recommendations for restrictions regarding the COVID-19 Pandemic (e.g. social distancing).  This format is felt to be most appropriate for this patient at this time balancing risks to patient and risks to population by having him in for in person visit.  No physical exam was performed (except for noted visual exam or audio findings with Telehealth visits).    Our team/I connected with Justin [name] at  3:40 PM EST by a video enabled telemedicine application (doxy.me or caregility through epic) and verified that I am speaking with the correct person using two identifiers.  Location patient: [facility] Location provider: [facility] office Persons participating in the virtual visit:  patient  Our team/I discussed the limitations of evaluation and management by telemedicine and the availability of in person appointments. In light of current covid-19 pandemic, patient also understands that we are trying to protect them by minimizing in office contact if at all possible.  The patient expressed consent for telemedicine visit and agreed to proceed. Patient understands insurance will be billed.   Past Medical History-  Patient Active Problem List   Diagnosis Date Noted   Mobitz type 2 second degree AV block 08/04/2020    Priority: High   Diabetes mellitus without complication ([place]) 72/53/6644    Priority: High   Hyperlipidemia associated with type 2 diabetes mellitus ([place]) 02/02/2018    Priority: Medium    Family history of prostate cancer 02/02/2018    Priority: Medium    Hypertension associated with diabetes ([place]) 12/23/2006    Priority: Medium    Dupuytren contracture 11/24/2018    Priority: Low   ED (erectile dysfunction)     Priority: Low   History of colonic polyps  01/18/2008    Priority: Low   Allergic rhinitis 11/07/2006    Priority: Low   Symptomatic bradycardia 08/06/2020   S/P placement of cardiac pacemaker 08/06/2020   2nd degree AV block 08/05/2020   Chest pain at rest 07/23/2020   RBBB 07/14/2018    Medications- reviewed and updated Current Outpatient Medications  Medication Sig Dispense Refill   acetaminophen (TYLENOL) 500 MG tablet Take 1,000 mg by mouth every 6 (six) hours as needed for mild pain.     glucose blood (ONETOUCH VERIO) test strip Use as instructed 100 each 12   Multiple Vitamin (MULTIVITAMIN) capsule Take 1 capsule by mouth daily.     tadalafil (CIALIS) 20 MG tablet Take 1 tablet (20 mg total) by mouth every other day as needed for erectile dysfunction. 10 tablet 11   atorvastatin (LIPITOR) 20 MG tablet Take 1 tablet (20 mg total) by mouth daily. 90 tablet 3   benazepril (LOTENSIN) 20 MG tablet Take 1 tablet (20 mg total) by mouth daily. 90 tablet 3   blood glucose meter kit and supplies KIT Dispense based on patient and insurance preference. Use up to four times daily as directed. (Patient not taking: Reported on 03/15/2021) 1 each 3   cetirizine (ZYRTEC) 10 MG tablet Take 10 mg by mouth daily as needed for allergies. (Patient not taking: Reported on 03/15/2021)     Dulaglutide (TRULICITY) 1.5 IH/4.7QQ SOPN Inject 1.5 mg into the skin once a week. 1.5 mL 3   Empagliflozin-metFORMIN HCl ER (SYNJARDY XR) 12.08-998 MG TB24 Take 1 tablet by mouth  2 (two) times daily. 180 tablet 3   ONETOUCH DELICA LANCETS FINE MISC 1 each by Does not apply route 2 (two) times daily. (Patient not taking: Reported on 03/15/2021) 100 each 3   No current facility-administered medications for this visit.     Objective:  BP (!) 145/88   Ht 6' (1.829 m)   Wt 160 lb (72.6 kg)   BMI 21.70 kg/m  self reported vitals Gen: NAD, resting comfortably Lungs: nonlabored, normal respiratory rate  Skin: appears dry, no obvious rash     Assessment and  Plan  #Second-degree AV block/symptomatic bradycardia-lead to postpartum pacemaker placement 08/06/2020. Follows with Dr. Gilman Schmidt primary cardiologist and Dr. Quentin Ore of electrophysiology- he reports things are going well on his electronic check ins and saw Dr. Quentin Ore on 11/10/20   # Diabetes-A1c typically under 7 S: Medication: Synjardy extended release (Jardiance and metformin) 12.08-998 mg extended release twice daily, Trulicity 1.5 mg CBGs- not checking lately Exercise and diet- 5k steps by noon today- trying to remain very active- more normal after pacemaker Lab Results  Component Value Date   HGBA1C 6.9 (H) 07/23/2020   HGBA1C 6.1 (A) 10/28/2019   HGBA1C 6.0 (A) 05/27/2019  A/P: hopefully stable- update a1c when comes by for labs. Continue current meds for now  #hypertension S: medication: Benazepril 20 mg Home readings #s: went to oral surgeon yesterday about wisdom teeth issues and BP was 154/68 prior to that had been running 120s/70s for most part BP Readings from Last 3 Encounters:  03/15/21 (!) 145/88  11/10/20 132/62  09/12/20 118/62  A/P: BP slightly high today and yesterday- he is going to do some home checks over next week and update me- if remains high can tweak meds but otherwise hold steady on dose.  - on 61m in past short term had lightheadedness   #hyperlipidemia S: Medication: Atorvastatin 20 mg daily, Aspirin in the past for primary prevention - stopped with clearance from Dr SGardiner Rhymefrom cardiology Lab Results  Component Value Date   CHOL 120 09/12/2020   HDL 50.60 09/12/2020   LDLCALC 57 09/12/2020   LDLDIRECT 52.0 07/14/2018   TRIG 60.0 09/12/2020   CHOLHDL 2 09/12/2020  A/P: doing well without side effects- continue current meds  #Erectile Dysfunction- Sildenafil 100 mg prn- mild improvement. Cialis 242mworking imperfectly- does not need refill  #plantar fasciitis back in may- has been much improved lately. May bother him mildly at times in morning  but cools off. Using Dr. ScFelicie Mornnserts.  Recommended follow up:  Future Appointments  Date Time Provider DeMoroni1/23/2023  7:00 AM [facility] DEVICE REMOTES [facility]  08/06/2021  7:00 AM [facility] DEVICE REMOTES [facility]  11/05/2021  7:00 AM [facility] DEVICE REMOTES [facility]  02/04/2022  7:00 AM [facility] DEVICE REMOTES [facility]    Lab/Order associations:   ICD-10-CM   1. Diabetes mellitus without complication (HCC)  E1Z60.1BC with Differential/Platelet    Comprehensive metabolic panel    Hemoglobin A1c    2. Hy[place] associated with diabetes ([place] E11.59    I15.2     3. Hyperlipidemia associated with type 2 diabetes mellitus ([place] E11.69    E78.5     4. Erectile dysfunction due to arterial insufficiency  N52.01     5. 2nd degree AV block  I44.1       Meds ordered this encounter  Medications   Empagliflozin-metFORMIN HCl ER (SYNJARDY XR) 12.08-998 MG TB24    Sig: Take 1  tablet by mouth 2 (two) times daily.    Dispense:  180 tablet    Refill:  3   Dulaglutide (TRULICITY) 1.5 UA/0.4BV SOPN    Sig: Inject 1.5 mg into the skin once a week.    Dispense:  1.5 mL    Refill:  3   atorvastatin (LIPITOR) 20 MG tablet    Sig: Take 1 tablet (20 mg total) by mouth daily.    Dispense:  90 tablet    Refill:  3   benazepril (LOTENSIN) 20 MG tablet    Sig: Take 1 tablet (20 mg total) by mouth daily.    Dispense:  90 tablet    Refill:  3     Return precautions advised.  Garret Reddish, MD

## 2021-03-15 NOTE — Patient Instructions (Signed)
Health Maintenance Due  Topic Date Due   OPHTHALMOLOGY EXAM  01/13/2021   HEMOGLOBIN A1C  01/22/2021    Recommended follow up: No follow-ups on file.

## 2021-03-19 ENCOUNTER — Encounter: Payer: Self-pay | Admitting: Family Medicine

## 2021-04-19 ENCOUNTER — Other Ambulatory Visit (INDEPENDENT_AMBULATORY_CARE_PROVIDER_SITE_OTHER): Payer: Medicare PPO

## 2021-04-19 ENCOUNTER — Other Ambulatory Visit: Payer: Self-pay

## 2021-04-19 DIAGNOSIS — E119 Type 2 diabetes mellitus without complications: Secondary | ICD-10-CM

## 2021-04-19 LAB — CBC WITH DIFFERENTIAL/PLATELET
Basophils Absolute: 0 10*3/uL (ref 0.0–0.1)
Basophils Relative: 0.7 % (ref 0.0–3.0)
Eosinophils Absolute: 0.4 10*3/uL (ref 0.0–0.7)
Eosinophils Relative: 5.9 % — ABNORMAL HIGH (ref 0.0–5.0)
HCT: 45.9 % (ref 39.0–52.0)
Hemoglobin: 15.2 g/dL (ref 13.0–17.0)
Lymphocytes Relative: 15.7 % (ref 12.0–46.0)
Lymphs Abs: 1 10*3/uL (ref 0.7–4.0)
MCHC: 33.1 g/dL (ref 30.0–36.0)
MCV: 92 fl (ref 78.0–100.0)
Monocytes Absolute: 0.5 10*3/uL (ref 0.1–1.0)
Monocytes Relative: 8 % (ref 3.0–12.0)
Neutro Abs: 4.5 10*3/uL (ref 1.4–7.7)
Neutrophils Relative %: 69.7 % (ref 43.0–77.0)
Platelets: 220 10*3/uL (ref 150.0–400.0)
RBC: 4.99 Mil/uL (ref 4.22–5.81)
RDW: 13.2 % (ref 11.5–15.5)
WBC: 6.4 10*3/uL (ref 4.0–10.5)

## 2021-04-19 LAB — COMPREHENSIVE METABOLIC PANEL
ALT: 12 U/L (ref 0–53)
AST: 12 U/L (ref 0–37)
Albumin: 4.1 g/dL (ref 3.5–5.2)
Alkaline Phosphatase: 49 U/L (ref 39–117)
BUN: 18 mg/dL (ref 6–23)
CO2: 26 mEq/L (ref 19–32)
Calcium: 9.2 mg/dL (ref 8.4–10.5)
Chloride: 106 mEq/L (ref 96–112)
Creatinine, Ser: 0.86 mg/dL (ref 0.40–1.50)
GFR: 90.67 mL/min (ref >60.00)
Glucose, Bld: 155 mg/dL — ABNORMAL HIGH (ref 70–99)
Potassium: 4.1 mEq/L (ref 3.5–5.1)
Sodium: 141 mEq/L (ref 135–145)
Total Bilirubin: 0.6 mg/dL (ref 0.2–1.2)
Total Protein: 6.9 g/dL (ref 6.0–8.3)

## 2021-04-19 LAB — HEMOGLOBIN A1C: Hgb A1c MFr Bld: 7.7 % — ABNORMAL HIGH (ref 4.6–6.5)

## 2021-04-23 ENCOUNTER — Encounter: Payer: Self-pay | Admitting: Family Medicine

## 2021-05-07 ENCOUNTER — Ambulatory Visit (INDEPENDENT_AMBULATORY_CARE_PROVIDER_SITE_OTHER): Payer: Medicare PPO

## 2021-05-07 DIAGNOSIS — I441 Atrioventricular block, second degree: Secondary | ICD-10-CM | POA: Diagnosis not present

## 2021-05-07 LAB — CUP PACEART REMOTE DEVICE CHECK
Battery Remaining Longevity: 147 mo
Battery Voltage: 3.12 V
Brady Statistic AP VP Percent: 0.01 %
Brady Statistic AP VS Percent: 0 %
Brady Statistic AS VP Percent: 99.97 %
Brady Statistic AS VS Percent: 0.02 %
Brady Statistic RA Percent Paced: 0.01 %
Brady Statistic RV Percent Paced: 99.98 %
Date Time Interrogation Session: 20230123010422
Implantable Lead Implant Date: 20220424
Implantable Lead Implant Date: 20220424
Implantable Lead Location: 753859
Implantable Lead Location: 753860
Implantable Lead Model: 3830
Implantable Lead Model: 5076
Implantable Pulse Generator Implant Date: 20220424
Lead Channel Impedance Value: 323 Ohm
Lead Channel Impedance Value: 399 Ohm
Lead Channel Impedance Value: 456 Ohm
Lead Channel Impedance Value: 570 Ohm
Lead Channel Pacing Threshold Amplitude: 0.75 V
Lead Channel Pacing Threshold Amplitude: 1 V
Lead Channel Pacing Threshold Pulse Width: 0.4 ms
Lead Channel Pacing Threshold Pulse Width: 0.4 ms
Lead Channel Sensing Intrinsic Amplitude: 25 mV
Lead Channel Sensing Intrinsic Amplitude: 25 mV
Lead Channel Sensing Intrinsic Amplitude: 3.5 mV
Lead Channel Sensing Intrinsic Amplitude: 3.5 mV
Lead Channel Setting Pacing Amplitude: 2 V
Lead Channel Setting Pacing Amplitude: 2 V
Lead Channel Setting Pacing Pulse Width: 0.4 ms
Lead Channel Setting Sensing Sensitivity: 2 mV

## 2021-05-18 NOTE — Progress Notes (Signed)
Remote pacemaker transmission.   

## 2021-06-21 ENCOUNTER — Ambulatory Visit (INDEPENDENT_AMBULATORY_CARE_PROVIDER_SITE_OTHER): Payer: Medicare PPO

## 2021-06-21 ENCOUNTER — Other Ambulatory Visit: Payer: Self-pay

## 2021-06-21 DIAGNOSIS — Z0000 Encounter for general adult medical examination without abnormal findings: Secondary | ICD-10-CM | POA: Diagnosis not present

## 2021-06-21 NOTE — Patient Instructions (Signed)
Mr. Justin [name] , Thank you for taking time to come for your Medicare Wellness Visit. I appreciate your ongoing commitment to your health goals. Please review the following plan we discussed and let me know if I can assist you in the future.   Screening recommendations/referrals: Colonoscopy: Done 01/05/13 repeat every 10 years  Recommended yearly ophthalmology/optometry visit for glaucoma screening and checkup Recommended yearly dental visit for hygiene and checkup  Vaccinations: Influenza vaccine: Done 02/02/21 repeat every year  Pneumococcal vaccine: Up to date Tdap vaccine: Done 05/04/12 repeat every 10 years  Shingles vaccine: completed    Covid-19: Completed 3/22, 4/12, 02/09/20 & 02/19/21  Advanced directives: Please bring a copy of your health care power of attorney and living will to the office at your convenience.  Conditions/risks identified: Lower A1c and maintain weight   Next appointment: Follow up in one year for your annual wellness visit.   Preventive Care 17 Years and Older, Male Preventive care refers to lifestyle choices and visits with your health care provider that can promote health and wellness. What does preventive care include? A yearly physical exam. This is also called an annual well check. Dental exams once or twice a year. Routine eye exams. Ask your health care provider how often you should have your eyes checked. Personal lifestyle choices, including: Daily care of your teeth and gums. Regular physical activity. Eating a healthy diet. Avoiding tobacco and drug use. Limiting alcohol use. Practicing safe sex. Taking low doses of aspirin every day. Taking vitamin and mineral supplements as recommended by your health care provider. What happens during an annual well check? The services and screenings done by your health care provider during your annual well check will depend on your age, overall health, lifestyle risk factors, and family history of  disease. Counseling  Your health care provider may ask you questions about your: Alcohol use. Tobacco use. Drug use. Emotional well-being. Home and relationship well-being. Sexual activity. Eating habits. History of falls. Memory and ability to understand (cognition). Work and work Astronomer. Screening  You may have the following tests or measurements: Height, weight, and BMI. Blood pressure. Lipid and cholesterol levels. These may be checked every 5 years, or more frequently if you are over 33 years old. Skin check. Lung cancer screening. You may have this screening every year starting at age 67 if you have a 30-pack-year history of smoking and currently smoke or have quit within the past 15 years. Fecal occult blood test (FOBT) of the stool. You may have this test every year starting at age 26. Flexible sigmoidoscopy or colonoscopy. You may have a sigmoidoscopy every 5 years or a colonoscopy every 10 years starting at age 38. Prostate cancer screening. Recommendations will vary depending on your family history and other risks. Hepatitis C blood test. Hepatitis B blood test. Sexually transmitted disease (STD) testing. Diabetes screening. This is done by checking your blood sugar (glucose) after you have not eaten for a while (fasting). You may have this done every 1-3 years. Abdominal aortic aneurysm (AAA) screening. You may need this if you are a current or former smoker. Osteoporosis. You may be screened starting at age 67 if you are at high risk. Talk with your health care provider about your test results, treatment options, and if necessary, the need for more tests. Vaccines  Your health care provider may recommend certain vaccines, such as: Influenza vaccine. This is recommended every year. Tetanus, diphtheria, and acellular pertussis (Tdap, Td) vaccine. You may need a  Td booster every 10 years. Zoster vaccine. You may need this after age 51. Pneumococcal 13-valent  conjugate (PCV13) vaccine. One dose is recommended after age 75. Pneumococcal polysaccharide (PPSV23) vaccine. One dose is recommended after age 54. Talk to your health care provider about which screenings and vaccines you need and how often you need them. This information is not intended to replace advice given to you by your health care provider. Make sure you discuss any questions you have with your health care provider. Document Released: 04/28/2015 Document Revised: 12/20/2015 Document Reviewed: 01/31/2015 Elsevier Interactive Patient Education  2017 [place] Prevention in the Home Falls can cause injuries. They can happen to people of all ages. There are many things you can do to make your home safe and to help prevent falls. What can I do on the outside of my home? Regularly fix the edges of walkways and driveways and fix any cracks. Remove anything that might make you trip as you walk through a door, such as a raised step or threshold. Trim any bushes or trees on the path to your home. Use bright outdoor lighting. Clear any walking paths of anything that might make someone trip, such as rocks or tools. Regularly check to see if handrails are loose or broken. Make sure that both sides of any steps have handrails. Any raised decks and porches should have guardrails on the edges. Have any leaves, snow, or ice cleared regularly. Use sand or salt on walking paths during winter. Clean up any spills in your garage right away. This includes oil or grease spills. What can I do in the bathroom? Use night lights. Install grab bars by the toilet and in the tub and shower. Do not use towel bars as grab bars. Use non-skid mats or decals in the tub or shower. If you need to sit down in the shower, use a plastic, non-slip stool. Keep the floor dry. Clean up any water that spills on the floor as soon as it happens. Remove soap buildup in the tub or shower regularly. Attach bath mats  securely with double-sided non-slip rug tape. Do not have throw rugs and other things on the floor that can make you trip. What can I do in the bedroom? Use night lights. Make sure that you have a light by your bed that is easy to reach. Do not use any sheets or blankets that are too big for your bed. They should not hang down onto the floor. Have a firm chair that has side arms. You can use this for support while you get dressed. Do not have throw rugs and other things on the floor that can make you trip. What can I do in the kitchen? Clean up any spills right away. Avoid walking on wet floors. Keep items that you use a lot in easy-to-reach places. If you need to reach something above you, use a strong step stool that has a grab bar. Keep electrical cords out of the way. Do not use floor polish or wax that makes floors slippery. If you must use wax, use non-skid floor wax. Do not have throw rugs and other things on the floor that can make you trip. What can I do with my stairs? Do not leave any items on the stairs. Make sure that there are handrails on both sides of the stairs and use them. Fix handrails that are broken or loose. Make sure that handrails are as long as the stairways. Check any  carpeting to make sure that it is firmly attached to the stairs. Fix any carpet that is loose or worn. Avoid having throw rugs at the top or bottom of the stairs. If you do have throw rugs, attach them to the floor with carpet tape. Make sure that you have a light switch at the top of the stairs and the bottom of the stairs. If you do not have them, ask someone to add them for you. What else can I do to help prevent falls? Wear shoes that: Do not have high heels. Have rubber bottoms. Are comfortable and fit you well. Are closed at the toe. Do not wear sandals. If you use a stepladder: Make sure that it is fully opened. Do not climb a closed stepladder. Make sure that both sides of the stepladder  are locked into place. Ask someone to hold it for you, if possible. Clearly mark and make sure that you can see: Any grab bars or handrails. First and last steps. Where the edge of each step is. Use tools that help you move around (mobility aids) if they are needed. These include: Canes. Walkers. Scooters. Crutches. Turn on the lights when you go into a dark area. Replace any light bulbs as soon as they burn out. Set up your furniture so you have a clear path. Avoid moving your furniture around. If any of your floors are uneven, fix them. If there are any pets around you, be aware of where they are. Review your medicines with your doctor. Some medicines can make you feel dizzy. This can increase your chance of falling. Ask your doctor what other things that you can do to help prevent falls. This information is not intended to replace advice given to you by your health care provider. Make sure you discuss any questions you have with your health care provider. Document Released: 01/26/2009 Document Revised: 09/07/2015 Document Reviewed: 05/06/2014 Elsevier Interactive Patient Education  2017 [employer].

## 2021-06-21 NOTE — Progress Notes (Signed)
Virtual Visit via Telephone Note  I connected with  Justin [name] on 06/21/21 at  2:30 PM EST by telephone and verified that I am speaking with the correct person using two identifiers.  Medicare Annual Wellness visit completed telephonically due to Covid-19 pandemic.   Persons participating in this call: This Health Coach and this patient.   Location: Patient: Home  Provider: Office    I discussed the limitations, risks, security and privacy concerns of performing an evaluation and management service by telephone and the availability of in person appointments. The patient expressed understanding and agreed to proceed.  Unable to perform video visit due to video visit attempted and failed and/or patient does not have video capability.   Some vital signs may be absent or patient reported.   Willette Brace, LPN   Subjective:   Justin [name] is a 66 y.o. male who presents for an Initial Medicare Annual Wellness Visit.  Review of Systems     Cardiac Risk Factors include: advanced age (>59mn, >>86women);diabetes mellitus;hypertension;dyslipidemia;male gender     Objective:    There were no vitals filed for this visit. There is no height or weight on file to calculate BMI.  Advanced Directives 06/21/2021 07/23/2020  Does Patient Have a Medical Advance Directive? No No  Would patient like information on creating a medical advance directive? Yes (MAU/Ambulatory/Procedural Areas - Information given) No - Patient declined    Current Medications (verified) Outpatient Encounter Medications as of 06/21/2021  Medication Sig   acetaminophen (TYLENOL) 500 MG tablet Take 1,000 mg by mouth every 6 (six) hours as needed for mild pain.   atorvastatin (LIPITOR) 20 MG tablet Take 1 tablet (20 mg total) by mouth daily.   benazepril (LOTENSIN) 20 MG tablet Take 1 tablet (20 mg total) by mouth daily.   cetirizine (ZYRTEC) 10 MG tablet Take 10 mg by mouth daily as needed for allergies.    Dulaglutide (TRULICITY) 1.5 MHQ/7.5FFSOPN Inject 1.5 mg into the skin once a week.   Empagliflozin-metFORMIN HCl ER (SYNJARDY XR) 12.08-998 MG TB24 Take 1 tablet by mouth 2 (two) times daily.   glucose blood (ONETOUCH VERIO) test strip Use as instructed   Multiple Vitamin (MULTIVITAMIN) capsule Take 1 capsule by mouth daily.   tadalafil (CIALIS) 20 MG tablet Take 1 tablet (20 mg total) by mouth every other day as needed for erectile dysfunction.   blood glucose meter kit and supplies KIT Dispense based on patient and insurance preference. Use up to four times daily as directed. (Patient not taking: Reported on 163/11/4663   ONETOUCH DELICA LANCETS FINE MISC 1 each by Does not apply route 2 (two) times daily. (Patient not taking: Reported on 03/15/2021)   No facility-administered encounter medications on file as of 06/21/2021.    Allergies (verified) Patient has no known allergies.   History: Past Medical History:  Diagnosis Date   Allergy    Diabetes mellitus 2007   ED (erectile dysfunction)    sildenafil 100 mg sparingly   History of colonic polyps    Hyperlipidemia associated with type 2 diabetes mellitus ([place]    Hypertension    PROSTATITIS, ACUTE 12/02/2009   Qualifier: Diagnosis of  By: LSilvio PateMD, RBaird Cancer   Past Surgical History:  Procedure Laterality Date   None     PACEMAKER IMPLANT N/A 08/06/2020   Procedure: PACEMAKER IMPLANT;  Surgeon: LVickie Epley MD;  [facility];  Service: Cardiovascular;  Laterality: N/A;  Family History  Problem Relation Age of Onset   Dementia Father    CAD Father        CABG mid 7s, never smoker   Hypertension Mother    Stroke Mother        35, first stroke 21. never smoker   Diabetes Mother        in late life   Hypertension Sister    Diabetes Brother    Hypertension Brother    Prostate cancer Brother        44   Cerebral palsy Daughter    Cerebral palsy Daughter    Pneumonia Maternal Grandfather         died from this   Diabetes Paternal Grandfather    Colon cancer Neg Hx    Social History   Socioeconomic History   Marital status: Married    Spouse name: Not on file   Number of children: Not on file   Years of education: Not on file   Highest education level: Not on file  Occupational History   Occupation: Retired  Tobacco Use   Smoking status: Never   Smokeless tobacco: Never  Vaping Use   Vaping Use: Never used  Substance and Sexual Activity   Alcohol use: Yes    Comment: socially   Drug use: No   Sexual activity: Yes    Comment: wife only  Other Topics Concern   Not on file  Social History Narrative   Married. Identical twin daughters 73 in 2019- mirror twins- born 52 weeks. Both daughters have CP and are wheelchair bound - both live in [place] - graduated top of their classes and have MBA from [employer]. 1 daughters married- they all live together.       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       Hobbies: travel, walks and hikes, skiing       Social Determinants of Health   Financial Resource Strain: Low Risk    Difficulty of Paying Living Expenses: Not hard at all  Food Insecurity: No Food Insecurity   Worried About Charity fundraiser in the Last Year: Never true   Arboriculturist in the Last Year: Never true  Transportation Needs: No Transportation Needs   Lack of Transportation (Medical): No   Lack of Transportation (Non-Medical): No  Physical Activity: Sufficiently Active   Days of Exercise per Week: 5 days   Minutes of Exercise per Session: 30 min  Stress: No Stress Concern Present   Feeling of Stress : Not at all  Social Connections: Socially Isolated   Frequency of Communication with Friends and Family: Once a week   Frequency of Social Gatherings with Friends and Family: Once a week   Attends Religious Services: Never   Marine scientist or Organizations: No   Attends Music therapist: Never   Marital Status:  Married    Tobacco Counseling Counseling given: Not Answered   Clinical Intake:  Pre-visit preparation completed: Yes  Pain : No/denies pain     BMI - recorded: 21.7 Nutritional Status: BMI of 19-24  Normal Nutritional Risks: None Diabetes: Yes CBG done?: No Did pt. bring in CBG monitor from home?: No  How often do you need to have someone help you when you read instructions, pamphlets, or other written materials from your doctor or pharmacy?: 1 - Never  Diabetic?Nutrition Risk Assessment:  Has the patient had any N/V/D within the last 2 months?  No  Does the patient have any non-healing wounds?  No  Has the patient had any unintentional weight loss or weight gain?  No   Diabetes:  Is the patient diabetic?  Yes  If diabetic, was a CBG obtained today?  No  Did the patient bring in their glucometer from home?  No  How often do you monitor your CBG's? N/A.   Financial Strains and Diabetes Management:  Are you having any financial strains with the device, your supplies or your medication? No .  Does the patient want to be seen by Chronic Care Management for management of their diabetes?  No  Would the patient like to be referred to a Nutritionist or for Diabetic Management?  No   Diabetic Exams:  Diabetic Eye Exam: Completed 02/28/21 Diabetic Foot Exam: Completed 09/12/20   Interpreter Needed?: No  Information entered by :: Charlott Rakes, LPN   Activities of Daily Living In your present state of health, do you have any difficulty performing the following activities: 06/21/2021 09/12/2020  Hearing? N N  Vision? N N  Difficulty concentrating or making decisions? N N  Walking or climbing stairs? N N  Dressing or bathing? N N  Doing errands, shopping? N N  Preparing Food and eating ? N -  Using the Toilet? N -  In the past six months, have you accidently leaked urine? N -  Do you have problems with loss of bowel control? N -  Managing your Medications? N -   Managing your Finances? N -  Housekeeping or managing your Housekeeping? N -  Some recent data might be hidden    Patient Care Team: Marin Olp, MD as PCP - General (Family Medicine) Donato Heinz, MD as PCP - Cardiology (Cardiology) Vickie Epley, MD as PCP - Electrophysiology (Cardiology)  Indicate any recent Medical Services you may have received from other than [facility] providers in the past year (date may be approximate).     Assessment:   This is a routine wellness examination for Justin [name].  Hearing/Vision screen Hearing Screening - Comments:: Pt denies  any hearing issues  Vision Screening - Comments:: Pt follows up with Dr Camillo Flaming for annual eye exams   Dietary issues and exercise activities discussed: Current Exercise Habits: Home exercise routine, Type of exercise: walking, Time (Minutes): 30, Frequency (Times/Week): 5, Weekly Exercise (Minutes/Week): 150   Goals Addressed             This Visit's Progress    Patient Stated       Get A1C lowered and maintain weight        Depression Screen PHQ 2/9 Scores 06/21/2021 09/12/2020 08/04/2020 10/28/2019 11/24/2018 02/02/2018 02/22/2014  PHQ - 2 Score 0 0 0 0 0 0 0  PHQ- 9 Score - 0 0 0 - - -    Fall Risk Fall Risk  06/21/2021 09/12/2020 08/04/2020 10/28/2019 02/02/2018  Falls in the past year? 0 0 0 0 No  Number falls in past yr: 0 0 0 0 -  Injury with Fall? 0 0 0 0 -  Risk for fall due to : Impaired vision - - - -  Follow up Falls prevention discussed - - - -    FALL RISK PREVENTION PERTAINING TO THE HOME:  Any stairs in or around the home? Yes  If so, are there any without handrails? No  Home free of loose throw rugs in walkways, pet beds, electrical cords, etc? Yes  Adequate lighting in  your home to reduce risk of falls? Yes   ASSISTIVE DEVICES UTILIZED TO PREVENT FALLS:  Life alert? No  Use of a cane, walker or w/c? No  Grab bars in the bathroom? Yes  Shower chair or bench in shower? Yes   Elevated toilet seat or a handicapped toilet? No   TIMED UP AND GO:  Was the test performed? No .   Cognitive Function:     6CIT Screen 06/21/2021  What Year? 0 points  What month? 0 points  What time? 0 points  Count back from 20 0 points  Months in reverse 0 points  Repeat phrase 0 points  Total Score 0    Immunizations Immunization History  Administered Date(s) Administered   Fluad Quad(high Dose 65+) 02/02/2021   Influenza Inj Mdck Quad With Preservative 02/13/2019   Influenza Split 12/26/2010, 01/02/2012   Influenza Whole 04/15/2005, 01/18/2008, 02/13/2010   Influenza,inj,Quad PF,6+ Mos 01/04/2013, 02/22/2014, 02/21/2015, 01/29/2016, 01/06/2017, 02/02/2018   PFIZER Comirnaty(Gray Top)Covid-19 Tri-Sucrose Vaccine 09/28/2020   PFIZER(Purple Top)SARS-COV-2 Vaccination 07/05/2019, 07/26/2019, 02/09/2020   PNEUMOCOCCAL CONJUGATE-20 09/12/2020   Pfizer Covid-19 Vaccine Bivalent Booster 35yr & up 02/19/2021   Pneumococcal Polysaccharide-23 02/02/2018   Tdap 05/04/2012   Zoster Recombinat (Shingrix) 02/15/2020, 05/19/2020    TDAP status: Up to date  Flu Vaccine status: Up to date  Pneumococcal vaccine status: Up to date  Covid-19 vaccine status: Completed vaccines  Qualifies for Shingles Vaccine? Yes   Zostavax completed Yes   Shingrix Completed?: Yes  Screening Tests Health Maintenance  Topic Date Due   FOOT EXAM  09/12/2021   HEMOGLOBIN A1C  10/17/2021   OPHTHALMOLOGY EXAM  02/28/2022   TETANUS/TDAP  05/04/2022   COLONOSCOPY (Pts 45-431yrInsurance coverage will need to be confirmed)  01/06/2023   Pneumonia Vaccine 6518Years old  Completed   INFLUENZA VACCINE  Completed   COVID-19 Vaccine  Completed   Hepatitis C Screening  Completed   HIV Screening  Completed   Zoster Vaccines- Shingrix  Completed   HPV VACCINES  Aged Out    Health Maintenance  There are no preventive care reminders to display for this patient.  Colorectal cancer screening: Type  of screening: Colonoscopy. Completed 01/05/13. Repeat every 10 years  Hepatitis C Screening:  Completed 07/29/17  Vision Screening: Recommended annual ophthalmology exams for early detection of glaucoma and other disorders of the eye. Is the patient up to date with their annual eye exam?  Yes  Who is the provider or what is the name of the office in which the patient attends annual eye exams? Dr TiCamillo Flamingf pt is not established with a provider, would they like to be referred to a provider to establish care? No .   Dental Screening: Recommended annual dental exams for proper oral hygiene  Community Resource Referral / Chronic Care Management: CRR required this visit?  No   CCM required this visit?  No      Plan:     I have personally reviewed and noted the following in the patients chart:   Medical and social history Use of alcohol, tobacco or illicit drugs  Current medications and supplements including opioid prescriptions. Patient is not currently taking opioid prescriptions. Functional ability and status Nutritional status Physical activity Advanced directives List of other physicians Hospitalizations, surgeries, and ER visits in previous 12 months Vitals Screenings to include cognitive, depression, and falls Referrals and appointments  In addition, I have reviewed and discussed with patient certain preventive protocols, quality metrics,  and best practice recommendations. A written personalized care plan for preventive services as well as general preventive health recommendations were provided to patient.     Willette Brace, LPN   04/20/8370   Nurse Notes: None

## 2021-08-06 ENCOUNTER — Ambulatory Visit (INDEPENDENT_AMBULATORY_CARE_PROVIDER_SITE_OTHER): Payer: Medicare PPO

## 2021-08-06 DIAGNOSIS — I441 Atrioventricular block, second degree: Secondary | ICD-10-CM

## 2021-08-07 LAB — CUP PACEART REMOTE DEVICE CHECK
Battery Remaining Longevity: 143 mo
Battery Voltage: 3.06 V
Brady Statistic AP VP Percent: 0 %
Brady Statistic AP VS Percent: 0 %
Brady Statistic AS VP Percent: 99.97 %
Brady Statistic AS VS Percent: 0.02 %
Brady Statistic RA Percent Paced: 0 %
Brady Statistic RV Percent Paced: 99.98 %
Date Time Interrogation Session: 20230423204341
Implantable Lead Implant Date: 20220424
Implantable Lead Implant Date: 20220424
Implantable Lead Location: 753859
Implantable Lead Location: 753860
Implantable Lead Model: 3830
Implantable Lead Model: 5076
Implantable Pulse Generator Implant Date: 20220424
Lead Channel Impedance Value: 304 Ohm
Lead Channel Impedance Value: 380 Ohm
Lead Channel Impedance Value: 494 Ohm
Lead Channel Impedance Value: 551 Ohm
Lead Channel Pacing Threshold Amplitude: 0.75 V
Lead Channel Pacing Threshold Amplitude: 1 V
Lead Channel Pacing Threshold Pulse Width: 0.4 ms
Lead Channel Pacing Threshold Pulse Width: 0.4 ms
Lead Channel Sensing Intrinsic Amplitude: 21.5 mV
Lead Channel Sensing Intrinsic Amplitude: 21.5 mV
Lead Channel Sensing Intrinsic Amplitude: 3.375 mV
Lead Channel Sensing Intrinsic Amplitude: 3.375 mV
Lead Channel Setting Pacing Amplitude: 2 V
Lead Channel Setting Pacing Amplitude: 2 V
Lead Channel Setting Pacing Pulse Width: 0.4 ms
Lead Channel Setting Sensing Sensitivity: 2 mV

## 2021-08-21 ENCOUNTER — Ambulatory Visit: Payer: Medicare PPO | Admitting: Family Medicine

## 2021-08-21 ENCOUNTER — Encounter: Payer: Self-pay | Admitting: Family Medicine

## 2021-08-21 VITALS — BP 132/64 | HR 86 | Temp 98.5°F | Ht 72.0 in | Wt 164.5 lb

## 2021-08-21 DIAGNOSIS — R351 Nocturia: Secondary | ICD-10-CM | POA: Diagnosis not present

## 2021-08-21 DIAGNOSIS — E119 Type 2 diabetes mellitus without complications: Secondary | ICD-10-CM

## 2021-08-21 NOTE — Patient Instructions (Addendum)
Please stop by lab before you go ?If you have mychart- we will send your results within 3 business days of [place] receiving them.  ?If you do not have mychart- we will call you about results within 5 business days of [place] receiving them.  ?*please also note that you will see labs on mychart as soon as they post. I will later go in and write notes on them- will say "notes from Dr. Durene Cal"  ? ?Please stop by the desk ok to add physical either in AM or PM and cancel visit on September 7th.  ? ?Recommended follow up: Return in about 4 months (around 12/22/2021) for physical or sooner if needed.Schedule b4 you leave. ?

## 2021-08-21 NOTE — Progress Notes (Signed)
?Phone [phone] ?In person visit ?  ?Subjective:  ? ?Justin [name] is a 66 y.o. year old very pleasant male patient who presents for/with See problem oriented charting ?Chief Complaint  ?Patient presents with  ? Follow-up  ?  6 month follow-up ?  ? ? ?Past Medical History-  ?Patient Active Problem List  ? Diagnosis Date Noted  ? [place] type 2 second degree AV block 08/04/2020  ?  Priority: High  ? Diabetes mellitus without complication ([place]) 40/01/2724  ?  Priority: High  ? Hyperlipidemia associated with type 2 diabetes mellitus ([place]) 02/02/2018  ?  Priority: Medium   ? Family history of prostate cancer 02/02/2018  ?  Priority: Medium   ? Hypertension associated with diabetes ([place]) 12/23/2006  ?  Priority: Medium   ? Dupuytren contracture 11/24/2018  ?  Priority: Low  ? ED (erectile dysfunction)   ?  Priority: Low  ? History of colonic polyps 01/18/2008  ?  Priority: Low  ? Allergic rhinitis 11/07/2006  ?  Priority: Low  ? Symptomatic bradycardia 08/06/2020  ? S/P placement of cardiac pacemaker 08/06/2020  ? 2nd degree AV block 08/05/2020  ? Chest pain at rest 07/23/2020  ? RBBB 07/14/2018  ? ? ?Medications- reviewed and updated ?Current Outpatient Medications  ?Medication Sig Dispense Refill  ? acetaminophen (TYLENOL) 500 MG tablet Take 1,000 mg by mouth every 6 (six) hours as needed for mild pain.    ? atorvastatin (LIPITOR) 20 MG tablet Take 1 tablet (20 mg total) by mouth daily. 90 tablet 3  ? benazepril (LOTENSIN) 20 MG tablet Take 1 tablet (20 mg total) by mouth daily. 90 tablet 3  ? blood glucose meter kit and supplies KIT Dispense based on patient and insurance preference. Use up to four times daily as directed. 1 each 3  ? cetirizine (ZYRTEC) 10 MG tablet Take 10 mg by mouth daily as needed for allergies.    ? Dulaglutide (TRULICITY) 1.5 DG/6.4QI SOPN Inject 1.5 mg into the skin once a week. 1.5 mL 3  ? Empagliflozin-metFORMIN HCl ER (SYNJARDY XR) 12.08-998 MG TB24 Take 1 tablet by mouth 2 (two)  times daily. 180 tablet 3  ? glucose blood (ONETOUCH VERIO) test strip Use as instructed 100 each 12  ? Multiple Vitamin (MULTIVITAMIN) capsule Take 1 capsule by mouth daily.    ? ONETOUCH DELICA LANCETS FINE MISC 1 each by Does not apply route 2 (two) times daily. 100 each 3  ? tadalafil (CIALIS) 20 MG tablet Take 1 tablet (20 mg total) by mouth every other day as needed for erectile dysfunction. 10 tablet 11  ? ?No current facility-administered medications for this visit.  ? ?  ?Objective:  ?BP 132/64   Pulse 86   Temp 98.5 ?F (36.9 ?C) (Temporal)   Ht 6' (1.829 m)   Wt 164 lb 8 oz (74.6 kg)   SpO2 98%   BMI 22.31 kg/m?  ?Gen: NAD, resting comfortably ?CV: RRR no murmurs rubs or gallops ?Lungs: CTAB no crackles, wheeze, rhonchi ?Ext: no edema ?Skin: warm, dry ?  ? ?Assessment and Plan  ? ?#social update- lost father in law after breaking hip at age 18- helping him. Brother passed away as well ?-[place] next [place]- [place] ? ?#Second-degree AV block/symptomatic bradycardia-lead to postpartum pacemaker placement 08/06/2020.  Follows with Dr. Gilman Schmidt primary cardiologist and Dr. Quentin Ore of electrophysiology- transports reports while he sleeps  once a quarter. Overall stable - continue current meds. No lightheadedness or presyncope  ? ?#  Diabetes-A1c typically under 7 ?S: Medication: Synjardy extended release (Jardiance and metformin) 12.08-998 mg extended release twice daily, Trulicity 1.5 mg ?CBGs- doesn't check regularly ?Exercise and diet- walking in neighborhood at least a mile or 1.25 per day- 7k steps a day  ?Lab Results  ?Component Value Date  ? HGBA1C 7.7 (H) 04/19/2021  ? HGBA1C 6.9 (H) 07/23/2020  ? HGBA1C 6.1 (A) 10/28/2019  ?A/P: hopefully improved- update a1c- continue current meds for now ?-if needed lets say if a1c above 7 again consider trulicity 3 mg- hed prefer 90 day supply ? ?#hypertension ?S: medication: Benazepril 20 mg ?Home readings #s: 130/70s at home  ?BP Readings from Last  3 Encounters:  ?08/21/21 132/64  ?03/15/21 (!) 145/88  ?11/10/20 132/62  ? A/P:  Controlled. Continue current medications.   ?  ?#hyperlipidemia ?S: Medication: Atorvastatin 20 mg daily ?Lab Results  ?Component Value Date  ? CHOL 120 09/12/2020  ? HDL 50.60 09/12/2020  ? [place] 57 09/12/2020  ? LDLDIRECT 52.0 07/14/2018  ? TRIG 60.0 09/12/2020  ? CHOLHDL 2 09/12/2020  ? A/P: Excellent control-continue current medication with LDL under 70 ?  ?#Erectile Dysfunction- Sildenafil 100 mg prn- mild improvement. Cialis mild benefit ? ?#family history prostate cancer- brother recently passed as noted- update PSA with labs ?Lab Results  ?Component Value Date  ? PSA 0.30 09/12/2020  ? PSA 0.41 11/24/2018  ? PSA 0.32 02/21/2015  ? ?Recommended follow up: Return in about 4 months (around 12/22/2021) for physical or sooner if needed.Schedule b4 you leave. ?Future Appointments  ?Date Time Provider [place]  ?11/05/2021  7:00 AM [facility] DEVICE REMOTES [facility]  ?12/20/2021  2:20 PM Marin Olp, MD [facility]  ?02/04/2022  7:00 AM [facility] DEVICE REMOTES [facility]  ?07/04/2022  2:30 PM [facility] HEALTH COACH [facility]  ? ? ?Lab/Order associations: ?  ICD-10-CM   ?1. Nocturia  R35.1 PSA  ?  ?2. Diabetes mellitus without complication ([place])  U51.4 Comprehensive metabolic panel  ?  Hemoglobin A1c  ?  ? ? ?No orders of the defined types were placed in this encounter. ? ? ?Return precautions advised.  ?Garret Reddish, MD ? ?

## 2021-08-22 ENCOUNTER — Other Ambulatory Visit: Payer: Self-pay | Admitting: Family Medicine

## 2021-08-22 LAB — COMPREHENSIVE METABOLIC PANEL
ALT: 13 U/L (ref 0–53)
AST: 11 U/L (ref 0–37)
Albumin: 4.5 g/dL (ref 3.5–5.2)
Alkaline Phosphatase: 53 U/L (ref 39–117)
BUN: 25 mg/dL — ABNORMAL HIGH (ref 6–23)
CO2: 28 mEq/L (ref 19–32)
Calcium: 9.8 mg/dL (ref 8.4–10.5)
Chloride: 101 mEq/L (ref 96–112)
Creatinine, Ser: 1.05 mg/dL (ref 0.40–1.50)
GFR: 74.15 mL/min (ref >60.00)
Glucose, Bld: 146 mg/dL — ABNORMAL HIGH (ref 70–99)
Potassium: 4.3 mEq/L (ref 3.5–5.1)
Sodium: 139 mEq/L (ref 135–145)
Total Bilirubin: 0.4 mg/dL (ref 0.2–1.2)
Total Protein: 6.9 g/dL (ref 6.0–8.3)

## 2021-08-22 LAB — HEMOGLOBIN A1C: Hgb A1c MFr Bld: 7.2 % — ABNORMAL HIGH (ref 4.6–6.5)

## 2021-08-22 LAB — PSA: PSA: 0.23 ng/mL (ref 0.10–4.00)

## 2021-08-22 MED ORDER — TRULICITY 3 MG/0.5ML ~~LOC~~ SOAJ: 3.0000 mg | SUBCUTANEOUS | 3 refills | Status: DC

## 2021-08-22 NOTE — Progress Notes (Signed)
Remote pacemaker transmission.   

## 2021-08-23 ENCOUNTER — Other Ambulatory Visit: Payer: Self-pay | Admitting: Family Medicine

## 2021-11-05 ENCOUNTER — Ambulatory Visit (INDEPENDENT_AMBULATORY_CARE_PROVIDER_SITE_OTHER): Payer: Medicare PPO

## 2021-11-05 DIAGNOSIS — I441 Atrioventricular block, second degree: Secondary | ICD-10-CM | POA: Diagnosis not present

## 2021-11-07 LAB — CUP PACEART REMOTE DEVICE CHECK
Battery Remaining Longevity: 140 mo
Battery Voltage: 3.03 V
Brady Statistic AP VP Percent: 0 %
Brady Statistic AP VS Percent: 0 %
Brady Statistic AS VP Percent: 99.96 %
Brady Statistic AS VS Percent: 0.04 %
Brady Statistic RA Percent Paced: 0 %
Brady Statistic RV Percent Paced: 99.96 %
Date Time Interrogation Session: 20230725135136
Implantable Lead Implant Date: 20220424
Implantable Lead Implant Date: 20220424
Implantable Lead Location: 753859
Implantable Lead Location: 753860
Implantable Lead Model: 3830
Implantable Lead Model: 5076
Implantable Pulse Generator Implant Date: 20220424
Lead Channel Impedance Value: 323 Ohm
Lead Channel Impedance Value: 380 Ohm
Lead Channel Impedance Value: 494 Ohm
Lead Channel Impedance Value: 570 Ohm
Lead Channel Pacing Threshold Amplitude: 0.75 V
Lead Channel Pacing Threshold Amplitude: 1 V
Lead Channel Pacing Threshold Pulse Width: 0.4 ms
Lead Channel Pacing Threshold Pulse Width: 0.4 ms
Lead Channel Sensing Intrinsic Amplitude: 15.5 mV
Lead Channel Sensing Intrinsic Amplitude: 15.5 mV
Lead Channel Sensing Intrinsic Amplitude: 3.625 mV
Lead Channel Sensing Intrinsic Amplitude: 3.625 mV
Lead Channel Setting Pacing Amplitude: 2 V
Lead Channel Setting Pacing Amplitude: 2 V
Lead Channel Setting Pacing Pulse Width: 0.4 ms
Lead Channel Setting Sensing Sensitivity: 2 mV

## 2021-11-17 NOTE — Progress Notes (Signed)
Cardiology Office Note:    Date:  11/22/2021   ID:  Justin [name], DOB 26-Aug-1955, MRN [id number]  PCP:  Marin Olp, MD  Cardiologist:  Donato Heinz, MD  Electrophysiologist:  Vickie Epley, MD   Referring MD: Marin Olp, MD   Chief Complaint: follow-up of bradycardia  History of Present Illness:    Justin [name] is a 66 y.o. 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 Gardiner Rhyme and Dr. Quentin Ore and presents today for routine follow-up.   Patient was admitted in 07/2020 with chest pain and found to have intermittent 2:1 AV block with heart rates dropping to the 40s. High-sensitivity troponin was negative. Echo showed LVEF of 65-70% with normal wall motion and grade 1 diastolic dysfunction. Chest pain was felt to be atypical. EP was consulted for bradycardia and recommended outpatient monitor. Monitor showed multiple episodes of Mobitz 2 AV block and high-grade AV block. He was readmitted later that month with worsening symptoms and underwent placement of dual chamber Medtronic PPM on 08/06/2020.  Patient was last seen by Dr. Quentin Ore in 10/2020 at which time he was doing very well from a cardiac standpoint.  Patient presents today for follow-up. He is doing very well from a cardiac standpoint. He denies any cardiac symptoms - no chest pain, shortness of breath, orthopnea, PND, lower extremity edema, palpitations, lightheadedness, dizziness, or syncope. He traveled to [place] earlier this summer and had no issues with this.  Past Medical History:  Diagnosis Date   Acute prostatitis 12/02/2009   Qualifier: Diagnosis of  By: Silvio Pate MD, [facility]    Allergy    AV block    ED (erectile dysfunction)    sildenafil 100 mg sparingly   History of colonic polyps    Hyperlipidemia    Hypertension    S/P placement of cardiac pacemaker     Symptomatic bradycardia    Type 2 diabetes mellitus 2007    Past Surgical History:  Procedure Laterality Date   None     PACEMAKER IMPLANT N/A 08/06/2020   Procedure: PACEMAKER IMPLANT;  Surgeon: Vickie Epley, MD;  Location: [facility];  Service: Cardiovascular;  Laterality: N/A;    Current Medications: Current Meds  Medication Sig   acetaminophen (TYLENOL) 500 MG tablet Take 1,000 mg by mouth every 6 (six) hours as needed for mild pain.   atorvastatin (LIPITOR) 20 MG tablet Take 1 tablet (20 mg total) by mouth daily.   benazepril (LOTENSIN) 20 MG tablet Take 1 tablet (20 mg total) by mouth daily.   blood glucose meter kit and supplies KIT Dispense based on patient and insurance preference. Use up to four times daily as directed.   cetirizine (ZYRTEC) 10 MG tablet Take 10 mg by mouth daily as needed for allergies.   Dulaglutide (TRULICITY) 3 XM/4.6OE SOPN Inject 3 mg as directed once a week.   Empagliflozin-metFORMIN HCl ER (SYNJARDY XR) 12.08-998 MG TB24 Take 1 tablet by mouth 2 (two) times daily.   glucose blood (ONETOUCH VERIO) test strip Use as instructed   Multiple Vitamin (MULTIVITAMIN) capsule Take 1 capsule by mouth daily.   ONETOUCH DELICA LANCETS FINE MISC 1 each by Does not apply route 2 (two) times daily.   tadalafil (CIALIS) 20 MG tablet Take 1 tablet (20 mg total) by mouth every other day as needed for erectile dysfunction.  Allergies:   Patient has no known allergies.   Social History   Socioeconomic History   Marital status: Married    Spouse name: Not on file   Number of children: Not on file   Years of education: Not on file   Highest education level: Not on file  Occupational History   Occupation: Retired  Tobacco Use   Smoking status: Never   Smokeless tobacco: Never  Vaping Use   Vaping Use: Never used  Substance and Sexual Activity   Alcohol use: Yes    Comment: socially   Drug use: No   Sexual activity: Yes    Comment: wife only   Other Topics Concern   Not on file  Social History Narrative   Married. Identical twin daughters 60 in 2019- mirror twins- born 19 weeks. Both daughters have CP and are wheelchair bound - both live in [place] - graduated top of their classes and have MBA from [employer]. 1 daughters married- they all live together.       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       Hobbies: travel, walks and hikes, skiing       Social Determinants of Health   Financial Resource Strain: Low Risk  (06/21/2021)   Overall Financial Resource Strain (CARDIA)    Difficulty of Paying Living Expenses: Not hard at all  Food Insecurity: No Food Insecurity (06/21/2021)   Hunger Vital Sign    Worried About Running Out of Food in the Last Year: Never true    Ran Out of Food in the Last Year: Never true  Transportation Needs: No Transportation Needs (06/21/2021)   PRAPARE - Hydrologist (Medical): No    Lack of Transportation (Non-Medical): No  Physical Activity: Sufficiently Active (06/21/2021)   Exercise Vital Sign    Days of Exercise per Week: 5 days    Minutes of Exercise per Session: 30 min  Stress: No Stress Concern Present (06/21/2021)   [place]    Feeling of Stress : Not at all  Social Connections: Socially Isolated (06/21/2021)   Social Connection and Isolation Panel [NHANES]    Frequency of Communication with Friends and Family: Once a week    Frequency of Social Gatherings with Friends and Family: Once a week    Attends Religious Services: Never    Marine scientist or Organizations: No    Attends Music therapist: Never    Marital Status: Married     Family History: The patient's family history includes CAD in his father; Cerebral palsy in his daughter and daughter; Dementia in his father; Diabetes in his brother, mother, and paternal grandfather; Hypertension in his brother,  mother, and sister; Pneumonia in his maternal grandfather; Prostate cancer in his brother; Stroke in his mother. There is no history of Colon cancer.  ROS:   Please see the history of present illness.     EKGs/Labs/Other Studies Reviewed:    The following studies were reviewed:  Echocardiogram 07/24/2020: Impressions:  1. Left ventricular ejection fraction, by estimation, is 65 to 70%. Left  ventricular ejection fraction by 2D MOD biplane is 67.4 %. The left  ventricle has normal function. The left ventricle has no regional wall  motion abnormalities. Left ventricular  diastolic parameters are consistent with Grade I diastolic dysfunction  (impaired relaxation).   2. Right ventricular systolic function is normal. The right ventricular  size is  normal. There is normal pulmonary artery systolic pressure. The  estimated right ventricular systolic pressure is 73.5 mmHg.   3. The mitral valve is grossly normal. Trivial mitral valve  regurgitation.   4. The aortic valve is tricuspid. Aortic valve regurgitation is trivial.  Mild aortic valve sclerosis is present, with no evidence of aortic valve  stenosis. Aortic regurgitation PHT measures 518 msec.   5. The inferior vena cava is normal in size with greater than 50%  respiratory variability, suggesting right atrial pressure of 3 mmHg.   Comparison(s): No prior Echocardiogram.    EKG:  EKG  ordered today. EKG personally reviewed and demonstrates ventricular paced rhythm with underlying sinus rhythm, rate 73 bpm. First degree AV block (244 ms). No acute ischemic changes. Left axis deviation. QTc 471 ms.  Recent Labs: 04/19/2021: Hemoglobin 15.2; Platelets 220.0 08/21/2021: ALT 13; BUN 25; Creatinine, Ser 1.05; Potassium 4.3; Sodium 139  Recent Lipid Panel    Component Value Date/Time   CHOL 120 09/12/2020 1335   TRIG 60.0 09/12/2020 1335   HDL 50.60 09/12/2020 1335   CHOLHDL 2 09/12/2020 1335   VLDL 12.0 09/12/2020 1335   LDLCALC 57  09/12/2020 1335   LDLDIRECT 52.0 07/14/2018 1425    Physical Exam:    Vital Signs: BP 124/62   Pulse 73   Ht 6' (1.829 m)   Wt 164 lb 9.6 oz (74.7 kg)   SpO2 98%   BMI 22.32 kg/m     Wt Readings from Last 3 Encounters:  11/22/21 164 lb 9.6 oz (74.7 kg)  08/21/21 164 lb 8 oz (74.6 kg)  03/15/21 160 lb (72.6 kg)     General: 66 y.o. Caucasian male in no acute distress. HEENT: Normocephalic and atraumatic. Sclera clear. EOMs intact. Neck: Supple. No JVD. Heart: RRR. Distinct S1 and S2. No murmurs, gallops, or rubs. Radial pulses 2+ and equal bilaterally. Lungs: No increased work of breathing. Clear to ausculation bilaterally. No wheezes, rhonchi, or rales.  Abdomen: Soft, non-distended, and non-tender to palpation.  Extremities: No lower extremity edema.    Skin: Warm and dry. Neuro: Alert and oriented x3. No focal deficits. Psych: Normal affect. Responds appropriately.  Assessment:    1. Symptomatic bradycardia   2. S/P placement of cardiac pacemaker   3. Primary hypertension   4. Hyperlipidemia, unspecified hyperlipidemia type   5. Type 2 diabetes mellitus without complication, without long-term current use of insulin (HCC)     Plan:    Symptomatic Bradycardia with 2:1 AV Block S/p Dual Chamber Medtronic PPM S/p PPM in 07/2020. Recent device interrogation on 11/06/2021 showed normal device function with 100% ventricular pacing. - Doing very well. - Followed by Dr. Quentin Ore. Scheduled to see him next month.  Hypertension BP well controlled. - Continue Benazepril 59m daily. - Renal function and potassium stable on most recent labs in 08/2021.   Hyperlipidemia Most recent lipid panel in 08/2020: Total Cholesterol 120, Triglycerides 60, HDL 50, LDL 57.  - Continue Lipitor 272mdaily.  - Followed by PCP.  Type 2 Diabetes Mellitus Hemoglobin A1c 7.2% in 08/2021. - On Empagliflozin-Metformin and Trulicity. - Managed by PCP.  Disposition: Follow up in 1  year.   Medication Adjustments/Labs and Tests Ordered: Current medicines are reviewed at length with the patient today.  Concerns regarding medicines are outlined above.  Orders Placed This Encounter  Procedures   EKG 12-Lead   No orders of the defined types were placed in this encounter.   Patient Instructions  Medication  Instructions:  Continue current. *If you need a refill on your cardiac medications before your next appointment, please call your pharmacy*   Lab Work: No lab work-up. If you have labs (blood work) drawn today and your tests are completely normal, you will receive your results only by: [place] (if you have MyChart) OR A paper copy in the mail If you have any lab test that is abnormal or we need to change your treatment, we will call you to review the results.   Testing/Procedures: None.   Follow-Up: At [facility], you and your health needs are our priority.  As part of our continuing mission to provide you with exceptional heart care, we have created designated Provider Care Teams.  These Care Teams include your primary Cardiologist (physician) and Advanced Practice Providers (APPs -  Physician Assistants and Nurse Practitioners) who all work together to provide you with the care you need, when you need it.  We recommend signing up for the patient portal called "MyChart".  Sign up information is provided on this After Visit Summary.  MyChart is used to connect with patients for Virtual Visits (Telemedicine).  Patients are able to view lab/test results, encounter notes, upcoming appointments, etc.  Non-urgent messages can be sent to your provider as well.   To learn more about what you can do with MyChart, go to [email or web address].    Your next appointment:   1 year(s)  The format for your next appointment:   In Person  Provider:   Dr. Gardiner Rhyme or Sande Rives, PA-C   Important Information About Sugar         Signed, Darreld Mclean, PA-C  11/22/2021 12:26 PM    [place]

## 2021-11-22 ENCOUNTER — Encounter: Payer: Self-pay | Admitting: Student

## 2021-11-22 ENCOUNTER — Ambulatory Visit: Payer: Medicare PPO | Admitting: Student

## 2021-11-22 VITALS — BP 124/62 | HR 73 | Ht 72.0 in | Wt 164.6 lb

## 2021-11-22 DIAGNOSIS — R001 Bradycardia, unspecified: Secondary | ICD-10-CM

## 2021-11-22 DIAGNOSIS — E785 Hyperlipidemia, unspecified: Secondary | ICD-10-CM

## 2021-11-22 DIAGNOSIS — E119 Type 2 diabetes mellitus without complications: Secondary | ICD-10-CM | POA: Diagnosis not present

## 2021-11-22 DIAGNOSIS — I10 Essential (primary) hypertension: Secondary | ICD-10-CM | POA: Diagnosis not present

## 2021-11-22 DIAGNOSIS — Z950 Presence of cardiac pacemaker: Secondary | ICD-10-CM | POA: Diagnosis not present

## 2021-11-22 NOTE — Patient Instructions (Signed)
Medication Instructions:  Continue current. *If you need a refill on your cardiac medications before your next appointment, please call your pharmacy*   Lab Work: No lab work-up. If you have labs (blood work) drawn today and your tests are completely normal, you will receive your results only by: MyChart Message (if you have MyChart) OR A paper copy in the mail If you have any lab test that is abnormal or we need to change your treatment, we will call you to review the results.   Testing/Procedures: None.   Follow-Up: At [facility], you and your health needs are our priority.  As part of our continuing mission to provide you with exceptional heart care, we have created designated Provider Care Teams.  These Care Teams include your primary Cardiologist (physician) and Advanced Practice Providers (APPs -  Physician Assistants and Nurse Practitioners) who all work together to provide you with the care you need, when you need it.  We recommend signing up for the patient portal called "MyChart".  Sign up information is provided on this After Visit Summary.  MyChart is used to connect with patients for Virtual Visits (Telemedicine).  Patients are able to view lab/test results, encounter notes, upcoming appointments, etc.  Non-urgent messages can be sent to your provider as well.   To learn more about what you can do with MyChart, go to [email or web address].    Your next appointment:   1 year(s)  The format for your next appointment:   In Person  Provider:   Dr. Bjorn Pippin or Marjie Skiff, PA-C   Important Information About Sugar

## 2021-12-06 NOTE — Progress Notes (Signed)
Remote pacemaker transmission.   

## 2021-12-19 ENCOUNTER — Encounter: Payer: Self-pay | Admitting: Family Medicine

## 2021-12-19 ENCOUNTER — Other Ambulatory Visit: Payer: Self-pay | Admitting: Family Medicine

## 2021-12-19 ENCOUNTER — Ambulatory Visit (INDEPENDENT_AMBULATORY_CARE_PROVIDER_SITE_OTHER): Payer: Medicare PPO | Admitting: Family Medicine

## 2021-12-19 VITALS — BP 112/60 | HR 83 | Temp 98.3°F | Ht 72.0 in | Wt 163.0 lb

## 2021-12-19 DIAGNOSIS — R351 Nocturia: Secondary | ICD-10-CM | POA: Diagnosis not present

## 2021-12-19 DIAGNOSIS — E119 Type 2 diabetes mellitus without complications: Secondary | ICD-10-CM

## 2021-12-19 DIAGNOSIS — Z0000 Encounter for general adult medical examination without abnormal findings: Secondary | ICD-10-CM | POA: Diagnosis not present

## 2021-12-19 LAB — POCT GLYCOSYLATED HEMOGLOBIN (HGB A1C): Hemoglobin A1C: 7.1 % — AB (ref 4.0–5.6)

## 2021-12-19 MED ORDER — TADALAFIL 20 MG PO TABS: 20.0000 mg | ORAL_TABLET | ORAL | 11 refills | Status: DC | PRN

## 2021-12-19 MED ORDER — TRULICITY 3 MG/0.5ML ~~LOC~~ SOAJ: 3.0000 mg | SUBCUTANEOUS | 3 refills | Status: DC

## 2021-12-19 NOTE — Progress Notes (Signed)
Phone: [phone]   Subjective:  Patient presents today for their annual physical. Chief complaint-noted.   See problem oriented charting- ROS- full  review of systems was completed and negative  Per full ROS sheet completed by patient  The following were reviewed and entered/updated in epic: Past Medical History:  Diagnosis Date   Acute prostatitis 12/02/2009   Qualifier: Diagnosis of  By: Silvio Pate MD, [facility]    Allergy    AV block    ED (erectile dysfunction)    sildenafil 100 mg sparingly   History of colonic polyps    Hyperlipidemia    Hypertension    S/P placement of cardiac pacemaker    Symptomatic bradycardia    Type 2 diabetes mellitus 2007   Patient Active Problem List   Diagnosis Date Noted   S/P placement of cardiac pacemaker 08/06/2020    Priority: High   Mobitz type 2 second degree AV block 08/04/2020    Priority: High   Diabetes mellitus without complication ([place]) 52/77/8242    Priority: High   Essential hypertension 12/23/2006    Priority: Medium    ED (erectile dysfunction)     Priority: Low   History of colonic polyps 01/18/2008    Priority: Low   RBBB 07/14/2018   Past Surgical History:  Procedure Laterality Date   None     PACEMAKER IMPLANT N/A 08/06/2020   Procedure: PACEMAKER IMPLANT;  Surgeon: Vickie Epley, MD;  Location: [facility];  Service: Cardiovascular;  Laterality: N/A;    Family History  Problem Relation Age of Onset   Hypertension Mother    Stroke Mother        33, first stroke 36. never smoker   Diabetes Mother        in late life   Dementia Father    CAD Father        CABG mid 7s, never smoker   Hypertension Sister    Diabetes Brother    Hypertension Brother    Prostate cancer Brother        29. died at 40   Pneumonia Maternal Grandfather        died from this   Diabetes Paternal Grandfather    Cerebral palsy Daughter    Cerebral palsy Daughter    Colon cancer Neg Hx     Medications- reviewed  and updated Current Outpatient Medications  Medication Sig Dispense Refill   acetaminophen (TYLENOL) 500 MG tablet Take 1,000 mg by mouth every 6 (six) hours as needed for mild pain.     atorvastatin (LIPITOR) 20 MG tablet Take 1 tablet (20 mg total) by mouth daily. 90 tablet 3   benazepril (LOTENSIN) 20 MG tablet Take 1 tablet (20 mg total) by mouth daily. 90 tablet 3   blood glucose meter kit and supplies KIT Dispense based on patient and insurance preference. Use up to four times daily as directed. 1 each 3   cetirizine (ZYRTEC) 10 MG tablet Take 10 mg by mouth daily as needed for allergies.     Dulaglutide (TRULICITY) 3 PN/3.6RW SOPN Inject 3 mg as directed once a week. 7 mL 3   Empagliflozin-metFORMIN HCl ER (SYNJARDY XR) 12.08-998 MG TB24 Take 1 tablet by mouth 2 (two) times daily. 180 tablet 3   glucose blood (ONETOUCH VERIO) test strip Use as instructed 100 each 12   Multiple Vitamin (MULTIVITAMIN) capsule Take 1 capsule by mouth daily.     ONETOUCH DELICA LANCETS FINE MISC 1 each by  Does not apply route 2 (two) times daily. 100 each 3   tadalafil (CIALIS) 20 MG tablet Take 1 tablet (20 mg total) by mouth every other day as needed for erectile dysfunction. 10 tablet 11   No current facility-administered medications for this visit.    Allergies-reviewed and updated No Known Allergies  Social History   Social History Narrative   Married. Identical twin daughters 41 in 2019- mirror twins- born 43 weeks. Both daughters have CP and are wheelchair bound - both live in [place] - graduated top of their classes and have MBA from [employer]. 1 daughters married- they all live together.       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       Hobbies: travel, walks and hikes, skiing       Objective  Objective:  BP 112/60   Pulse 83   Temp 98.3 F (36.8 C)   Ht 6' (1.829 m)   Wt 163 lb (73.9 kg)   SpO2 99%   BMI 22.11 kg/m  Gen: NAD, resting comfortably HEENT: Mucous  membranes are moist. Oropharynx normal Neck: no thyromegaly CV: RRR no murmurs rubs or gallops Lungs: CTAB no crackles, wheeze, rhonchi Abdomen: soft/nontender/nondistended/normal bowel sounds. No rebound or guarding.  Ext: no edema Skin: warm, dry Neuro: grossly normal, moves all extremities, PERRLA   Diabetic Foot Exam - Simple   Simple Foot Form Diabetic Foot exam was performed with the following findings: Yes 12/19/2021  3:14 PM  Visual Inspection No deformities, no ulcerations, no other skin breakdown bilaterally: Yes Sensation Testing Intact to touch and monofilament testing bilaterally: Yes Pulse Check Posterior Tibialis and Dorsalis pulse intact bilaterally: Yes Comments        Assessment and Plan  66 y.o. male presenting for annual physical.  Health Maintenance counseling: 1. Anticipatory guidance: Patient counseled regarding regular dental exams -q4 months, eye exams -yearly,  avoiding smoking and second hand smoke , limiting alcohol to 2 beverages per day , no illicit drugs.   2. Risk factor reduction:  Advised patient of need for regular exercise and diet rich and fruits and vegetables to reduce risk of heart attack and stroke.  Exercise- walking 5 days a week- 1.25 miles- 17-18 mins timed but doing more than that- walking dogs Diet/weight management-stable- reasonably healthy diet.  Wt Readings from Last 3 Encounters:  12/19/21 163 lb (73.9 kg)  11/22/21 164 lb 9.6 oz (74.7 kg)  08/21/21 164 lb 8 oz (74.6 kg)  3. Immunizations/screenings/ancillary studies- flu and covid likely October vaccines , RSV - hold off for now Immunization History  Administered Date(s) Administered   Fluad Quad(high Dose 65+) 02/02/2021   Influenza Inj Mdck Quad With Preservative 02/13/2019   Influenza Split 12/26/2010, 01/02/2012   Influenza Whole 04/15/2005, 01/18/2008, 02/13/2010   Influenza,inj,Quad PF,6+ Mos 01/04/2013, 02/22/2014, 02/21/2015, 01/29/2016, 01/06/2017, 02/02/2018    PFIZER Comirnaty(Gray Top)Covid-19 Tri-Sucrose Vaccine 09/28/2020   PFIZER(Purple Top)SARS-COV-2 Vaccination 07/05/2019, 07/26/2019, 02/09/2020   PNEUMOCOCCAL CONJUGATE-20 09/12/2020   Pfizer Covid-19 Vaccine Bivalent Booster 68yr & up 02/19/2021   Pneumococcal Polysaccharide-23 02/02/2018   Tdap 05/04/2012   Zoster Recombinat (Shingrix) 02/15/2020, 05/19/2020   4. Prostate cancer screening- low risk prior PSA trend-since just done a few months ago we will defer repeat for now . Prefers rectal exams in general- opts out today Lab Results  Component Value Date   PSA 0.23 08/21/2021   PSA 0.30 09/12/2020   PSA 0.41 11/24/2018   5. Colon cancer screening - 12/2012  with 10 year repeat 6. Skin cancer screening- no dermatologist. advised regular sunscreen use. Denies worrisome, changing, or new skin lesions.  7. Smoking associated screening (lung cancer screening, AAA screen 65-75, UA)- never smoker 8. STD screening - only active with wife  Status of chronic or acute concerns   #Second-degree AV block/symptomatic bradycardia-lead to postpartum pacemaker placement 08/06/2020.  Follows with Dr. Gilman Schmidt primary cardiologist and Dr. Quentin Ore of electrophysiology- scheduled in September with Dr. Quentin Ore- had seen general card on 11/22/21   # Diabetes-A1c typically under 7 S: Medication: Synjardy extended release (Jardiance and metformin) 12.08-998 mg extended release twice daily, Trulicity 1.5 mg--> 3 mg with high a1c CBGs- not checking Exercise and diet- was on a cruise for 17 days in last few months Lab Results  Component Value Date   HGBA1C 7.2 (H) 08/21/2021   HGBA1C 7.7 (H) 04/19/2021   HGBA1C 6.9 (H) 07/23/2020  A/P: a1c poc 7.1- could be higher phlebtomy- prefers to hold current meds and work on healthy eating/regular exercise and recheck 4 months  #hypertension S: medication: Benazepril 20 mg- dry cough but tolerable enough BP Readings from Last 3 Encounters:  12/19/21 112/60  11/22/21  124/62  08/21/21 132/64  A/P: Controlled. Continue current medications.    #hyperlipidemia S: Medication: Atorvastatin 20 mg daily Lab Results  Component Value Date   CHOL 120 09/12/2020   HDL 50.60 09/12/2020   LDLCALC 57 09/12/2020   LDLDIRECT 52.0 07/14/2018   TRIG 60.0 09/12/2020   CHOLHDL 2 09/12/2020   A/P: hopefully stable- update lipid panel with labs. Continue current meds for now  #Erectile Dysfunction- Sildenafil 100 mg prn- mild improvement. Cialis mild benefit- will refill 2nd option  Recommended follow up: Return in about 4 months (around 04/20/2022) for followup or sooner if needed.Schedule b4 you leave. Future Appointments  Date Time Provider [place]  01/09/2022  3:45 PM Vickie Epley, MD [facility]  02/04/2022  7:00 AM [facility] DEVICE REMOTES [facility]  05/06/2022  7:00 AM [facility] DEVICE REMOTES [facility]  07/04/2022  2:30 PM [facility] HEALTH CO[facility]  08/05/2022  7:00 AM [facility] DEVICE REMOTES [facility]  11/04/2022  7:00 AM [facility] DEVICE REMOTES [facility]  02/03/2023  7:00 AM [facility] DEVICE REMOTES [facility]  05/05/2023  7:00 AM [facility] DEVICE REMOTES [facility]   Lab/Order associations:will come back fasting   ICD-10-CM   1. Preventative health care  Z00.00     2. Diabetes mellitus without complication ([place])  X91.4     3. Nocturia  R35.1       No orders of the defined types were placed in this encounter.   Return precautions advised.  Garret Reddish, MD

## 2021-12-19 NOTE — Patient Instructions (Addendum)
Flu shot (high dose) we should have these available within a month or two but please let us know if you get at outside pharmacy  Schedule a lab visit at the check out desk within 2 weeks. Return for future fasting labs meaning nothing but water after midnight please. Ok to take your medications with water.   Recommended follow up: Return in about 4 months (around 04/20/2022) for followup or sooner if needed.Schedule b4 you leave.

## 2021-12-20 ENCOUNTER — Encounter: Payer: Medicare PPO | Admitting: Family Medicine

## 2021-12-20 NOTE — Telephone Encounter (Signed)
PA has been submitted.

## 2022-01-01 ENCOUNTER — Other Ambulatory Visit (INDEPENDENT_AMBULATORY_CARE_PROVIDER_SITE_OTHER): Payer: Medicare PPO

## 2022-01-01 DIAGNOSIS — E119 Type 2 diabetes mellitus without complications: Secondary | ICD-10-CM

## 2022-01-01 LAB — CBC WITH DIFFERENTIAL/PLATELET
Basophils Absolute: 0 10*3/uL (ref 0.0–0.1)
Basophils Relative: 0.8 % (ref 0.0–3.0)
Eosinophils Absolute: 0.5 10*3/uL (ref 0.0–0.7)
Eosinophils Relative: 8.8 % — ABNORMAL HIGH (ref 0.0–5.0)
HCT: 43.7 % (ref 39.0–52.0)
Hemoglobin: 14.9 g/dL (ref 13.0–17.0)
Lymphocytes Relative: 25.2 % (ref 12.0–46.0)
Lymphs Abs: 1.3 10*3/uL (ref 0.7–4.0)
MCHC: 34.1 g/dL (ref 30.0–36.0)
MCV: 92.4 fl (ref 78.0–100.0)
Monocytes Absolute: 0.4 10*3/uL (ref 0.1–1.0)
Monocytes Relative: 8.6 % (ref 3.0–12.0)
Neutro Abs: 3 10*3/uL (ref 1.4–7.7)
Neutrophils Relative %: 56.6 % (ref 43.0–77.0)
Platelets: 222 10*3/uL (ref 150.0–400.0)
RBC: 4.73 Mil/uL (ref 4.22–5.81)
RDW: 13.4 % (ref 11.5–15.5)
WBC: 5.2 10*3/uL (ref 4.0–10.5)

## 2022-01-01 LAB — COMPREHENSIVE METABOLIC PANEL
ALT: 10 U/L (ref 0–53)
AST: 10 U/L (ref 0–37)
Albumin: 3.9 g/dL (ref 3.5–5.2)
Alkaline Phosphatase: 46 U/L (ref 39–117)
BUN: 21 mg/dL (ref 6–23)
CO2: 27 mEq/L (ref 19–32)
Calcium: 8.9 mg/dL (ref 8.4–10.5)
Chloride: 105 mEq/L (ref 96–112)
Creatinine, Ser: 0.87 mg/dL (ref 0.40–1.50)
GFR: 89.91 mL/min (ref >60.00)
Glucose, Bld: 122 mg/dL — ABNORMAL HIGH (ref 70–99)
Potassium: 4.3 mEq/L (ref 3.5–5.1)
Sodium: 140 mEq/L (ref 135–145)
Total Bilirubin: 0.4 mg/dL (ref 0.2–1.2)
Total Protein: 6.7 g/dL (ref 6.0–8.3)

## 2022-01-01 LAB — LIPID PANEL
Cholesterol: 108 mg/dL (ref 0–200)
HDL: 47.1 mg/dL (ref >39.00)
LDL Cholesterol: 52 mg/dL (ref 0–99)
NonHDL: 60.72
Total CHOL/HDL Ratio: 2
Triglycerides: 46 mg/dL (ref 0.0–149.0)
VLDL: 9.2 mg/dL (ref 0.0–40.0)

## 2022-01-09 ENCOUNTER — Ambulatory Visit: Payer: Medicare PPO | Attending: Cardiology | Admitting: Cardiology

## 2022-01-09 ENCOUNTER — Encounter: Payer: Self-pay | Admitting: Cardiology

## 2022-01-09 VITALS — BP 122/58 | HR 86 | Ht 72.0 in | Wt 166.0 lb

## 2022-01-09 DIAGNOSIS — I441 Atrioventricular block, second degree: Secondary | ICD-10-CM | POA: Diagnosis not present

## 2022-01-09 DIAGNOSIS — Z950 Presence of cardiac pacemaker: Secondary | ICD-10-CM

## 2022-01-09 DIAGNOSIS — R001 Bradycardia, unspecified: Secondary | ICD-10-CM | POA: Diagnosis not present

## 2022-01-09 NOTE — Patient Instructions (Signed)
Medication Instructions:  none *If you need a refill on your cardiac medications before your next appointment, please call your pharmacy*   Lab Work: none If you have labs (blood work) drawn today and your tests are completely normal, you will receive your results only by: [place] (if you have MyChart) OR A paper copy in the mail If you have any lab test that is abnormal or we need to change your treatment, we will call you to review the results.   Testing/Procedures: Your physician has requested that you have an echocardiogram. Echocardiography is a painless test that uses sound waves to create images of your heart. It provides your doctor with information about the size and shape of your heart and how well your heart's chambers and valves are working. This procedure takes approximately one hour. There are no restrictions for this procedure.    Follow-Up: At [facility], you and your health needs are our priority.  As part of our continuing mission to provide you with exceptional heart care, we have created designated Provider Care Teams.  These Care Teams include your primary Cardiologist (physician) and Advanced Practice Providers (APPs -  Physician Assistants and Nurse Practitioners) who all work together to provide you with the care you need, when you need it.  We recommend signing up for the patient portal called "MyChart".  Sign up information is provided on this After Visit Summary.  MyChart is used to connect with patients for Virtual Visits (Telemedicine).  Patients are able to view lab/test results, encounter notes, upcoming appointments, etc.  Non-urgent messages can be sent to your provider as well.   To learn more about what you can do with MyChart, go to [email or web address].    Your next appointment:   1 year(s)  The format for your next appointment:   In Person  Provider:   You will see one of the following Advanced Practice Providers on your  designated Care Team:   Tommye Standard, [place] Legrand Como "Jonni Sanger" Chalmers Cater, [place]      Other Instructions None   Important Information About Sugar

## 2022-01-09 NOTE — Progress Notes (Deleted)
Electrophysiology Office Follow up Visit Note:    Date:  01/09/2022   ID:  GERMAIN KOOPMANN, DOB Jul 20, 1955, MRN [id number]  PCP:  Marin Olp, MD  [facility] Cardiologist:  Donato Heinz, MD  [place] Electrophysiologist:  Vickie Epley, MD    Interval History:    Claus Silvestro Mccowan is a 66 y.o. male who presents for a follow up visit.   I last saw the patient in clinic November 10, 2020 after his pacemaker was implanted August 06, 2020.       Past Medical History:  Diagnosis Date   Acute prostatitis 12/02/2009   Qualifier: Diagnosis of  By: Silvio Pate MD, [facility]    Allergy    AV block    ED (erectile dysfunction)    sildenafil 100 mg sparingly   History of colonic polyps    Hyperlipidemia    Hypertension    S/P placement of cardiac pacemaker    Symptomatic bradycardia    Type 2 diabetes mellitus 2007    Past Surgical History:  Procedure Laterality Date   None     PACEMAKER IMPLANT N/A 08/06/2020   Procedure: PACEMAKER IMPLANT;  Surgeon: Vickie Epley, MD;  Location: [place] [facility];  Service: Cardiovascular;  Laterality: N/A;    Current Medications: No outpatient medications have been marked as taking for the 01/09/22 encounter (Appointment) with Vickie Epley, MD.     Allergies:   Patient has no known allergies.   Social History   Socioeconomic History   Marital status: Married    Spouse name: Not on file   Number of children: Not on file   Years of education: Not on file   Highest education level: Not on file  Occupational History   Occupation: Retired  Tobacco Use   Smoking status: Never   Smokeless tobacco: Never  Vaping Use   Vaping Use: Never used  Substance and Sexual Activity   Alcohol use: Yes    Comment: socially   Drug use: No   Sexual activity: Yes    Comment: wife only  Other Topics Concern   Not on file  Social History Narrative   Married. Identical twin daughters 66 in 2019- mirror twins-  born 60 weeks. Both daughters have CP and are wheelchair bound - both live in [place] - graduated top of their classes and have MBA from [employer]. 1 daughters married- they all live together.       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       Hobbies: travel, walks and hikes, skiing       Social Determinants of Health   Financial Resource Strain: Low Risk  (06/21/2021)   Overall Financial Resource Strain (CARDIA)    Difficulty of Paying Living Expenses: Not hard at all  Food Insecurity: No Food Insecurity (06/21/2021)   Hunger Vital Sign    Worried About Running Out of Food in the Last Year: Never true    Ran Out of Food in the Last Year: Never true  Transportation Needs: No Transportation Needs (06/21/2021)   PRAPARE - Hydrologist (Medical): No    Lack of Transportation (Non-Medical): No  Physical Activity: Sufficiently Active (06/21/2021)   Exercise Vital Sign    Days of Exercise per Week: 5 days    Minutes of Exercise per Session: 30 min  Stress: No Stress Concern Present (06/21/2021)   [place]  Feeling of Stress : Not at all  Social Connections: Socially Isolated (06/21/2021)   Social Connection and Isolation Panel [NHANES]    Frequency of Communication with Friends and Family: Once a week    Frequency of Social Gatherings with Friends and Family: Once a week    Attends Religious Services: Never    Database administrator or Organizations: No    Attends Engineer, structural: Never    Marital Status: Married     Family History: The patient's family history includes CAD in his father; Cerebral palsy in his daughter and daughter; Dementia in his father; Diabetes in his brother, mother, and paternal grandfather; Hypertension in his brother, mother, and sister; Pneumonia in his maternal grandfather; Prostate cancer in his brother; Stroke in his mother. There is no history  of Colon cancer.  ROS:   Please see the history of present illness.    All other systems reviewed and are negative.  EKGs/Labs/Other Studies Reviewed:    The following studies were reviewed today:  January 09, 2022 in clinic device interrogation personally reviewed ***  July 24, 2020 echo EF 65    Recent Labs: 01/01/2022: ALT 10; BUN 21; Creatinine, Ser 0.87; Hemoglobin 14.9; Platelets 222.0; Potassium 4.3; Sodium 140  Recent Lipid Panel    Component Value Date/Time   CHOL 108 01/01/2022 0844   TRIG 46.0 01/01/2022 0844   HDL 47.10 01/01/2022 0844   CHOLHDL 2 01/01/2022 0844   VLDL 9.2 01/01/2022 0844   LDLCALC 52 01/01/2022 0844   LDLDIRECT 52.0 07/14/2018 1425    Physical Exam:    VS:  There were no vitals taken for this visit.    Wt Readings from Last 3 Encounters:  12/19/21 163 lb (73.9 kg)  11/22/21 164 lb 9.6 oz (74.7 kg)  08/21/21 164 lb 8 oz (74.6 kg)     GEN: *** Well nourished, well developed in no acute distress HEENT: Normal NECK: No JVD; No carotid bruits LYMPHATICS: No lymphadenopathy CARDIAC: ***RRR, no murmurs, rubs, gallops.  Pacemaker pocket well-healed RESPIRATORY:  Clear to auscultation without rales, wheezing or rhonchi  ABDOMEN: Soft, non-tender, non-distended MUSCULOSKELETAL:  No edema; No deformity  SKIN: Warm and dry NEUROLOGIC:  Alert and oriented x 3 PSYCHIATRIC:  Normal affect        ASSESSMENT:    1. Symptomatic bradycardia   2. Heart block AV second degree   3. Cardiac pacemaker in situ    PLAN:    In order of problems listed above:   #Pacemaker in situ Device functioning appropriately.  100% V pacing. Will repeat echocardiogram given high burden of ventricular pacing. Continue remote monitoring  Follow-up 1 year with APP.    Medication Adjustments/Labs and Tests Ordered: Current medicines are reviewed at length with the patient today.  Concerns regarding medicines are outlined above.  No orders of the  defined types were placed in this encounter.  No orders of the defined types were placed in this encounter.    Signed, Steffanie Dunn, MD, [facility], [facility] 01/09/2022 5:58 AM    Electrophysiology [facility]

## 2022-01-09 NOTE — Progress Notes (Addendum)
Electrophysiology Office Follow up Visit Note:    Date:  01/09/2022   ID:  Justin [name], DOB Oct 21, 1955, MRN [id number]  PCP:  Marin Olp, MD  [facility] Cardiologist:  Donato Heinz, MD  [place] Electrophysiologist:  Vickie Epley, MD    Interval History:    Justin [name] is a 66 y.o. male who presents for a follow up visit.   I last saw the patient in clinic November 10, 2020 after his pacemaker was implanted August 06, 2020.  Today:  He has been feeling well.  He walks a mile and a half a day. He has no shortness of breath with exercise.   His incision is healing well.   He denies any palpitations, chest pain, shortness of breath, or peripheral edema. No lightheadedness, headaches, syncope, orthopnea, or PND.      Past Medical History:  Diagnosis Date   Acute prostatitis 12/02/2009   Qualifier: Diagnosis of  By: Silvio Pate MD, [facility]    Allergy    AV block    ED (erectile dysfunction)    sildenafil 100 mg sparingly   History of colonic polyps    Hyperlipidemia    Hypertension    S/P placement of cardiac pacemaker    Symptomatic bradycardia    Type 2 diabetes mellitus 2007    Past Surgical History:  Procedure Laterality Date   None     PACEMAKER IMPLANT N/A 08/06/2020   Procedure: PACEMAKER IMPLANT;  Surgeon: Vickie Epley, MD;  Location: [place] [facility];  Service: Cardiovascular;  Laterality: N/A;    Current Medications: Current Meds  Medication Sig   acetaminophen (TYLENOL) 500 MG tablet Take 1,000 mg by mouth every 6 (six) hours as needed for mild pain.   atorvastatin (LIPITOR) 20 MG tablet Take 1 tablet (20 mg total) by mouth daily.   benazepril (LOTENSIN) 20 MG tablet Take 1 tablet (20 mg total) by mouth daily.   blood glucose meter kit and supplies KIT Dispense based on patient and insurance preference. Use up to four times daily as directed.   cetirizine (ZYRTEC) 10 MG tablet Take 10 mg by mouth daily as  needed for allergies.   Dulaglutide (TRULICITY) 3 HU/3.1SH SOPN Inject 3 mg as directed once a week.   Empagliflozin-metFORMIN HCl ER (SYNJARDY XR) 12.08-998 MG TB24 Take 1 tablet by mouth 2 (two) times daily.   glucose blood (ONETOUCH VERIO) test strip Use as instructed   Multiple Vitamin (MULTIVITAMIN) capsule Take 1 capsule by mouth daily.   ONETOUCH DELICA LANCETS FINE MISC 1 each by Does not apply route 2 (two) times daily.   tadalafil (CIALIS) 20 MG tablet TAKE 1 TABLET (20 MG TOTAL) BY MOUTH EVERY OTHER DAY AS NEEDED FOR ERECTILE DYSFUNCTION.     Allergies:   Patient has no known allergies.   Social History   Socioeconomic History   Marital status: Married    Spouse name: Not on file   Number of children: Not on file   Years of education: Not on file   Highest education level: Not on file  Occupational History   Occupation: Retired  Tobacco Use   Smoking status: Never   Smokeless tobacco: Never  Vaping Use   Vaping Use: Never used  Substance and Sexual Activity   Alcohol use: Yes    Comment: socially   Drug use: No   Sexual activity: Yes    Comment: wife only  Other Topics Concern   Not  on file  Social History Narrative   Married. Identical twin daughters 57 in 2019- mirror twins- born 59 weeks. Both daughters have CP and are wheelchair bound - both live in [place] - graduated top of their classes and have MBA from [employer]. 1 daughters married- they all live together.       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       Hobbies: travel, walks and hikes, skiing       Social Determinants of Health   Financial Resource Strain: Low Risk  (06/21/2021)   Overall Financial Resource Strain (CARDIA)    Difficulty of Paying Living Expenses: Not hard at all  Food Insecurity: No Food Insecurity (06/21/2021)   Hunger Vital Sign    Worried About Running Out of Food in the Last Year: Never true    Ran Out of Food in the Last Year: Never true  Transportation Needs:  No Transportation Needs (06/21/2021)   PRAPARE - Hydrologist (Medical): No    Lack of Transportation (Non-Medical): No  Physical Activity: Sufficiently Active (06/21/2021)   Exercise Vital Sign    Days of Exercise per Week: 5 days    Minutes of Exercise per Session: 30 min  Stress: No Stress Concern Present (06/21/2021)   [place]    Feeling of Stress : Not at all  Social Connections: Socially Isolated (06/21/2021)   Social Connection and Isolation Panel [NHANES]    Frequency of Communication with Friends and Family: Once a week    Frequency of Social Gatherings with Friends and Family: Once a week    Attends Religious Services: Never    Marine scientist or Organizations: No    Attends Music therapist: Never    Marital Status: Married     Family History: The patient's family history includes CAD in his father; Cerebral palsy in his daughter and daughter; Dementia in his father; Diabetes in his brother, mother, and paternal grandfather; Hypertension in his brother, mother, and sister; Pneumonia in his maternal grandfather; Prostate cancer in his brother; Stroke in his mother. There is no history of Colon cancer.  ROS:   Please see the history of present illness.    No pertinent symptoms.    All other systems reviewed and are negative.  EKGs/Labs/Other Studies Reviewed:    The following studies were reviewed today:  01/09/2022 in clinic device interrogation personally reviewed Battery longevity 11.4 years Lead parameters stable 100% VP   EKG: EKG is personally reviewed 01/09/22: no EKG ordered  Recent Labs: 01/01/2022: ALT 10; BUN 21; Creatinine, Ser 0.87; Hemoglobin 14.9; Platelets 222.0; Potassium 4.3; Sodium 140  Recent Lipid Panel    Component Value Date/Time   CHOL 108 01/01/2022 0844   TRIG 46.0 01/01/2022 0844   HDL 47.10 01/01/2022 0844   CHOLHDL 2  01/01/2022 0844   VLDL 9.2 01/01/2022 0844   LDLCALC 52 01/01/2022 0844   LDLDIRECT 52.0 07/14/2018 1425    Physical Exam:    VS:  BP (!) 122/58   Pulse 86   Ht 6' (1.829 m)   Wt 166 lb (75.3 kg)   SpO2 97%   BMI 22.51 kg/m     Wt Readings from Last 3 Encounters:  01/09/22 166 lb (75.3 kg)  12/19/21 163 lb (73.9 kg)  11/22/21 164 lb 9.6 oz (74.7 kg)     GEN:  Well nourished, well developed in no acute  distress HEENT: Normal NECK: No JVD; No carotid bruits LYMPHATICS: No lymphadenopathy CARDIAC: RRR, no murmurs, rubs, gallops.  Pacemaker pocket well-healed RESPIRATORY:  Clear to auscultation without rales, wheezing or rhonchi  ABDOMEN: Soft, non-tender, non-distended MUSCULOSKELETAL:  No edema; No deformity  SKIN: Warm and dry NEUROLOGIC:  Alert and oriented x 3 PSYCHIATRIC:  Normal affect        ASSESSMENT:    1. Symptomatic bradycardia   2. Heart block AV second degree   3. Cardiac pacemaker in situ    PLAN:    In order of problems listed above:   #Pacemaker in situ Device functioning appropriately.  100% V pacing. Will repeat echocardiogram given high burden of ventricular pacing. Continue remote monitoring   Follow up in 1 year with APP.    Medication Adjustments/Labs and Tests Ordered: Current medicines are reviewed at length with the patient today.  Concerns regarding medicines are outlined above.  Orders Placed This Encounter  Procedures   ECHOCARDIOGRAM COMPLETE   No orders of the defined types were placed in this encounter.   I,Mary Mosetta Pigeon Buren,acting as a scribe for Vickie Epley, MD.,have documented all relevant documentation on the behalf of Vickie Epley, MD,as directed by  Vickie Epley, MD while in the presence of Vickie Epley, MD.   I, Vickie Epley, MD, have reviewed all documentation for this visit. The documentation on 01/09/22 for the exam, diagnosis, procedures, and orders are all accurate and  complete.   Signed, Lars Mage, MD, [facility], [facility] 01/09/2022 7:53 PM    Electrophysiology [facility]

## 2022-01-22 ENCOUNTER — Ambulatory Visit (HOSPITAL_BASED_OUTPATIENT_CLINIC_OR_DEPARTMENT_OTHER): Payer: Medicare PPO

## 2022-01-22 DIAGNOSIS — I441 Atrioventricular block, second degree: Secondary | ICD-10-CM

## 2022-01-22 DIAGNOSIS — Z950 Presence of cardiac pacemaker: Secondary | ICD-10-CM

## 2022-01-22 DIAGNOSIS — R001 Bradycardia, unspecified: Secondary | ICD-10-CM

## 2022-01-22 LAB — ECHOCARDIOGRAM COMPLETE
Area-P 1/2: 3.08 cm2
P 1/2 time: 278 msec
S' Lateral: 2.98 cm

## 2022-01-26 ENCOUNTER — Other Ambulatory Visit: Payer: Self-pay | Admitting: Family Medicine

## 2022-02-04 ENCOUNTER — Ambulatory Visit (INDEPENDENT_AMBULATORY_CARE_PROVIDER_SITE_OTHER): Payer: Medicare PPO

## 2022-02-04 DIAGNOSIS — I441 Atrioventricular block, second degree: Secondary | ICD-10-CM | POA: Diagnosis not present

## 2022-02-05 LAB — CUP PACEART REMOTE DEVICE CHECK
Battery Remaining Longevity: 136 mo
Battery Voltage: 3.02 V
Brady Statistic AP VP Percent: 0 %
Brady Statistic AP VS Percent: 0 %
Brady Statistic AS VP Percent: 99.96 %
Brady Statistic AS VS Percent: 0.04 %
Brady Statistic RA Percent Paced: 0 %
Brady Statistic RV Percent Paced: 99.96 %
Date Time Interrogation Session: 20231022214741
Implantable Lead Connection Status: 753985
Implantable Lead Connection Status: 753985
Implantable Lead Implant Date: 20220424
Implantable Lead Implant Date: 20220424
Implantable Lead Location: 753859
Implantable Lead Location: 753860
Implantable Lead Model: 3830
Implantable Lead Model: 5076
Implantable Pulse Generator Implant Date: 20220424
Lead Channel Impedance Value: 304 Ohm
Lead Channel Impedance Value: 361 Ohm
Lead Channel Impedance Value: 437 Ohm
Lead Channel Impedance Value: 551 Ohm
Lead Channel Pacing Threshold Amplitude: 0.75 V
Lead Channel Pacing Threshold Amplitude: 0.875 V
Lead Channel Pacing Threshold Pulse Width: 0.4 ms
Lead Channel Pacing Threshold Pulse Width: 0.4 ms
Lead Channel Sensing Intrinsic Amplitude: 16.5 mV
Lead Channel Sensing Intrinsic Amplitude: 16.5 mV
Lead Channel Sensing Intrinsic Amplitude: 3 mV
Lead Channel Sensing Intrinsic Amplitude: 3 mV
Lead Channel Setting Pacing Amplitude: 2 V
Lead Channel Setting Pacing Amplitude: 2 V
Lead Channel Setting Pacing Pulse Width: 0.4 ms
Lead Channel Setting Sensing Sensitivity: 2 mV
Zone Setting Status: 755011

## 2022-02-19 ENCOUNTER — Other Ambulatory Visit: Payer: Self-pay | Admitting: *Deleted

## 2022-02-19 DIAGNOSIS — I5022 Chronic systolic (congestive) heart failure: Secondary | ICD-10-CM

## 2022-02-27 NOTE — Progress Notes (Signed)
Remote pacemaker transmission.   

## 2022-03-06 ENCOUNTER — Other Ambulatory Visit: Payer: Self-pay | Admitting: Family Medicine

## 2022-03-12 ENCOUNTER — Other Ambulatory Visit (HOSPITAL_COMMUNITY): Payer: Medicare PPO

## 2022-04-10 ENCOUNTER — Other Ambulatory Visit: Payer: Self-pay | Admitting: Family Medicine

## 2022-04-23 ENCOUNTER — Encounter: Payer: Self-pay | Admitting: Family Medicine

## 2022-04-23 ENCOUNTER — Ambulatory Visit: Payer: Medicare PPO | Admitting: Family Medicine

## 2022-04-23 VITALS — BP 142/66 | HR 91 | Temp 97.3°F | Ht 72.0 in | Wt 162.8 lb

## 2022-04-23 DIAGNOSIS — E119 Type 2 diabetes mellitus without complications: Secondary | ICD-10-CM

## 2022-04-23 NOTE — Progress Notes (Signed)
Phone [phone] In person visit   Subjective:   Justin [name] is a 67 y.o. year old very pleasant male patient who presents for/with See problem oriented charting Chief Complaint  Patient presents with   Follow-up   Diabetes   Past Medical History-  Patient Active Problem List   Diagnosis Date Noted   S/P placement of cardiac pacemaker 08/06/2020    Priority: High   Mobitz type 2 second degree AV block 08/04/2020    Priority: High   Diabetes mellitus without complication (HCC) 12/23/2006    Priority: High   Essential hypertension 12/23/2006    Priority: Medium    ED (erectile dysfunction)     Priority: Low   History of colonic polyps 01/18/2008    Priority: Low   RBBB 07/14/2018    Medications- reviewed and updated Current Outpatient Medications  Medication Sig Dispense Refill   acetaminophen (TYLENOL) 500 MG tablet Take 1,000 mg by mouth every 6 (six) hours as needed for mild pain.     atorvastatin (LIPITOR) 20 MG tablet TAKE 1 TABLET BY MOUTH EVERY DAY 90 tablet 3   benazepril (LOTENSIN) 20 MG tablet TAKE 1 TABLET BY MOUTH EVERY DAY 90 tablet 3   blood glucose meter kit and supplies KIT Dispense based on patient and insurance preference. Use up to four times daily as directed. 1 each 3   cetirizine (ZYRTEC) 10 MG tablet Take 10 mg by mouth daily as needed for allergies.     Dulaglutide (TRULICITY) 3 MG/0.5ML SOPN INJECT 3 MG AS DIRECTED ONCE A WEEK. 6 mL 2   glucose blood (ONETOUCH VERIO) test strip Use as instructed 100 each 12   Multiple Vitamin (MULTIVITAMIN) capsule Take 1 capsule by mouth daily.     ONETOUCH DELICA LANCETS FINE MISC 1 each by Does not apply route 2 (two) times daily. 100 each 3   SYNJARDY XR 12.08-998 MG TB24 TAKE 1 TABLET BY MOUTH TWICE A DAY 180 tablet 3   tadalafil (CIALIS) 20 MG tablet TAKE 1 TABLET (20 MG TOTAL) BY MOUTH EVERY OTHER DAY AS NEEDED FOR ERECTILE DYSFUNCTION. 6 tablet 19   No current facility-administered medications for  this visit.     Objective:  BP (!) 142/66   Pulse 91   Temp (!) 97.3 F (36.3 C)   Ht 6' (1.829 m)   Wt 162 lb 12.8 oz (73.8 kg)   SpO2 98%   BMI 22.08 kg/m  Gen: NAD, resting comfortably CV: RRR no murmurs rubs or gallops Lungs: CTAB no crackles, wheeze, rhonchi Ext: no edema     Assessment and Plan   #Second-degree AV block/symptomatic bradycardia-lead to postpartum pacemaker placement 08/06/2020.  Follows with Dr. Jerene Pitch primary cardiologist and Dr. Lalla Brothers of electrophysiology-most recent visit slight decrease in pulmonary function to 45 to 50% and patient has planned follow-up in 6 months for repeat echo and then seeing Dr. Lalla Brothers again  # Diabetes-A1c typically under 7 S: Medication: Synjardy extended release (Jardiance and metformin) 12.08-998 mg extended release twice daily, Trulicity 3 mg  Exercise and diet- walking 1.5 miles per day for the most part, generally has done well other than the holidays   Lab Results  Component Value Date   HGBA1C 7.1 (A) 12/19/2021   HGBA1C 7.2 (H) 08/21/2021   HGBA1C 7.7 (H) 04/19/2021   A/P: Last A1c was point-of-care-we will check phlebotomy today-hoping for substantial improvement-prefer A1c under 7-continue current occasions for now  #hypertension S: medication: Benazepril 20 mg Home readings #s:  can be up to 140s or 150s BP Readings from Last 3 Encounters:  04/23/22 (!) 142/66  01/09/22 (!) 122/58  12/19/21 112/60  A/P: blood pressure mildly high today for first time in a few visits- before we make any changes lets check daily for 1-2 weeks then update me- continue current meds for now   #hyperlipidemia S: Medication: Atorvastatin 20 mg daily Lab Results  Component Value Date   CHOL 108 01/01/2022   HDL 47.10 01/01/2022   LDLCALC 52 01/01/2022   LDLDIRECT 52.0 07/14/2018   TRIG 46.0 01/01/2022   CHOLHDL 2 01/01/2022   A/P: Very well-controlled last check-continue current medication   Recommended follow up: Return  in about 4 months (around 08/22/2022) for followup or sooner if needed.Schedule b4 you leave. Future Appointments  Date Time Provider [place]  05/06/2022  7:00 AM [facility] DEVICE REMOTES [facility]  07/04/2022  2:30 PM [facility] HEALTH COACH [facility]  07/17/2022  3:05 PM [facility] ECHO 1 [facility]  07/23/2022  1:45 PM Vickie Epley, MD [facility]  08/05/2022  7:00 AM [facility] DEVICE REMOTES [facility]  08/22/2022  1:20 PM Marin Olp, MD [facility] PEC  11/04/2022  7:00 AM [facility] DEVICE REMOTES [facility]  12/24/2022  2:20 PM Marin Olp, MD [facility]  02/03/2023  7:00 AM [facility] DEVICE REMOTES [facility]  05/05/2023  7:00 AM [facility] DEVICE REMOTES [facility]    Lab/Order associations:   ICD-10-CM   1. Diabetes mellitus without complication (HCC)  F35.4 Microalbumin / creatinine urine ratio    Hemoglobin A1c    Comprehensive metabolic panel      No orders of the defined types were placed in this encounter.   Return precautions advised.  Garret Reddish, MD

## 2022-04-23 NOTE — Patient Instructions (Addendum)
Have your diabetic eye exam sent to [place] next week.  blood pressure mildly high today for first time in a few visits- before we make any changes lets check daily for 1-2 weeks then update me- continue current meds for now   Recommended follow up: Return in about 4 months (around 08/22/2022) for followup or sooner if needed.Schedule b4 you leave.  - could also then schedule 4 months after that (make sure full year from last physical)

## 2022-04-24 LAB — COMPREHENSIVE METABOLIC PANEL
ALT: 13 U/L (ref 0–53)
AST: 13 U/L (ref 0–37)
Albumin: 4.3 g/dL (ref 3.5–5.2)
Alkaline Phosphatase: 49 U/L (ref 39–117)
BUN: 21 mg/dL (ref 6–23)
CO2: 23 mEq/L (ref 19–32)
Calcium: 9.6 mg/dL (ref 8.4–10.5)
Chloride: 103 mEq/L (ref 96–112)
Creatinine, Ser: 0.96 mg/dL (ref 0.40–1.50)
GFR: 82.19 mL/min (ref >60.00)
Glucose, Bld: 230 mg/dL — ABNORMAL HIGH (ref 70–99)
Potassium: 4.6 mEq/L (ref 3.5–5.1)
Sodium: 140 mEq/L (ref 135–145)
Total Bilirubin: 0.6 mg/dL (ref 0.2–1.2)
Total Protein: 7 g/dL (ref 6.0–8.3)

## 2022-04-24 LAB — MICROALBUMIN / CREATININE URINE RATIO
Creatinine,U: 45.8 mg/dL
Microalb Creat Ratio: 10.4 mg/g (ref 0.0–30.0)
Microalb, Ur: 4.8 mg/dL — ABNORMAL HIGH (ref 0.0–1.9)

## 2022-04-24 LAB — HEMOGLOBIN A1C: Hgb A1c MFr Bld: 7.8 % — ABNORMAL HIGH (ref 4.6–6.5)

## 2022-04-26 ENCOUNTER — Encounter: Payer: Self-pay | Admitting: Family Medicine

## 2022-04-27 MED ORDER — TRULICITY 4.5 MG/0.5ML ~~LOC~~ SOAJ
4.5000 mg | SUBCUTANEOUS | 5 refills | Status: DC
  Filled 2022-07-08: qty 2, 28d supply, fill #0
  Filled 2022-07-09 – 2022-07-29 (×2): qty 2, 28d supply, fill #1
  Filled 2022-08-26: qty 2, 28d supply, fill #2
  Filled 2022-10-18: qty 2, 28d supply, fill #3

## 2022-04-30 DIAGNOSIS — E119 Type 2 diabetes mellitus without complications: Secondary | ICD-10-CM | POA: Diagnosis not present

## 2022-04-30 LAB — HM DIABETES EYE EXAM

## 2022-05-06 ENCOUNTER — Ambulatory Visit: Payer: Medicare PPO | Attending: Cardiology

## 2022-05-06 DIAGNOSIS — I441 Atrioventricular block, second degree: Secondary | ICD-10-CM | POA: Diagnosis not present

## 2022-05-07 LAB — CUP PACEART REMOTE DEVICE CHECK
Battery Remaining Longevity: 134 mo
Battery Voltage: 3.02 V
Brady Statistic AP VP Percent: 0.01 %
Brady Statistic AP VS Percent: 0 %
Brady Statistic AS VP Percent: 99.96 %
Brady Statistic AS VS Percent: 0.03 %
Brady Statistic RA Percent Paced: 0 %
Brady Statistic RV Percent Paced: 99.97 %
Date Time Interrogation Session: 20240121220513
Implantable Lead Connection Status: 753985
Implantable Lead Connection Status: 753985
Implantable Lead Implant Date: 20220424
Implantable Lead Implant Date: 20220424
Implantable Lead Location: 753859
Implantable Lead Location: 753860
Implantable Lead Model: 3830
Implantable Lead Model: 5076
Implantable Pulse Generator Implant Date: 20220424
Lead Channel Impedance Value: 323 Ohm
Lead Channel Impedance Value: 399 Ohm
Lead Channel Impedance Value: 418 Ohm
Lead Channel Impedance Value: 589 Ohm
Lead Channel Pacing Threshold Amplitude: 0.75 V
Lead Channel Pacing Threshold Amplitude: 0.875 V
Lead Channel Pacing Threshold Pulse Width: 0.4 ms
Lead Channel Pacing Threshold Pulse Width: 0.4 ms
Lead Channel Sensing Intrinsic Amplitude: 3.75 mV
Lead Channel Sensing Intrinsic Amplitude: 3.75 mV
Lead Channel Sensing Intrinsic Amplitude: 6.75 mV
Lead Channel Sensing Intrinsic Amplitude: 6.75 mV
Lead Channel Setting Pacing Amplitude: 1.75 V
Lead Channel Setting Pacing Amplitude: 2 V
Lead Channel Setting Pacing Pulse Width: 0.4 ms
Lead Channel Setting Sensing Sensitivity: 2 mV
Zone Setting Status: 755011

## 2022-05-21 MED ORDER — AMLODIPINE BESYLATE 2.5 MG PO TABS: 2.5000 mg | ORAL_TABLET | Freq: Every day | ORAL | 5 refills | Status: DC

## 2022-06-21 NOTE — Progress Notes (Signed)
Remote pacemaker transmission.   

## 2022-06-27 ENCOUNTER — Encounter: Payer: Self-pay | Admitting: Family Medicine

## 2022-07-04 ENCOUNTER — Ambulatory Visit (INDEPENDENT_AMBULATORY_CARE_PROVIDER_SITE_OTHER): Payer: Medicare PPO

## 2022-07-04 VITALS — Wt 162.0 lb

## 2022-07-04 DIAGNOSIS — Z0000 Encounter for general adult medical examination without abnormal findings: Secondary | ICD-10-CM

## 2022-07-04 NOTE — Patient Instructions (Signed)
Justin [name] , Thank you for taking time to come for your Medicare Wellness Visit. I appreciate your ongoing commitment to your health goals. Please review the following plan we discussed and let me know if I can assist you in the future.   These are the goals we discussed:  Goals      Patient Stated     Get A1C lowered and maintain weight      Patient Stated     Get A1C down and maintain weight         This is a list of the screening recommended for you and due dates:  Health Maintenance  Topic Date Due   COVID-19 Vaccine (6 - 2023-24 season) 12/14/2021   DTaP/Tdap/Td vaccine (2 - Td or Tdap) 05/04/2022   Hemoglobin A1C  10/22/2022   Complete foot exam   12/20/2022   Colon Cancer Screening  01/06/2023   Yearly kidney function blood test for diabetes  04/24/2023   Yearly kidney health urinalysis for diabetes  04/24/2023   Eye exam for diabetics  05/01/2023   Medicare Annual Wellness Visit  07/04/2023   Pneumonia Vaccine  Completed   Flu Shot  Completed   Hepatitis C Screening: USPSTF Recommendation to screen - Ages 18-79 yo.  Completed   Zoster (Shingles) Vaccine  Completed   HPV Vaccine  Aged Out    Advanced directives: Please bring a copy of your health care power of attorney and living will to the office at your convenience.  Conditions/risks identified: get A1C down and maintain weight   Next appointment: Follow up in one year for your annual wellness visit.   Preventive Care 30 Years and Older, Male  Preventive care refers to lifestyle choices and visits with your health care provider that can promote health and wellness. What does preventive care include? A yearly physical exam. This is also called an annual well check. Dental exams once or twice a year. Routine eye exams. Ask your health care provider how often you should have your eyes checked. Personal lifestyle choices, including: Daily care of your teeth and gums. Regular physical activity. Eating a  healthy diet. Avoiding tobacco and drug use. Limiting alcohol use. Practicing safe sex. Taking low doses of aspirin every day. Taking vitamin and mineral supplements as recommended by your health care provider. What happens during an annual well check? The services and screenings done by your health care provider during your annual well check will depend on your age, overall health, lifestyle risk factors, and family history of disease. Counseling  Your health care provider may ask you questions about your: Alcohol use. Tobacco use. Drug use. Emotional well-being. Home and relationship well-being. Sexual activity. Eating habits. History of falls. Memory and ability to understand (cognition). Work and work Statistician. Screening  You may have the following tests or measurements: Height, weight, and BMI. Blood pressure. Lipid and cholesterol levels. These may be checked every 5 years, or more frequently if you are over 3 years old. Skin check. Lung cancer screening. You may have this screening every year starting at age 69 if you have a 30-pack-year history of smoking and currently smoke or have quit within the past 15 years. Fecal occult blood test (FOBT) of the stool. You may have this test every year starting at age 87. Flexible sigmoidoscopy or colonoscopy. You may have a sigmoidoscopy every 5 years or a colonoscopy every 10 years starting at age 70. Prostate cancer screening. Recommendations will vary depending on your family  history and other risks. Hepatitis C blood test. Hepatitis B blood test. Sexually transmitted disease (STD) testing. Diabetes screening. This is done by checking your blood sugar (glucose) after you have not eaten for a while (fasting). You may have this done every 1-3 years. Abdominal aortic aneurysm (AAA) screening. You may need this if you are a current or former smoker. Osteoporosis. You may be screened starting at age 55 if you are at high risk. Talk  with your health care provider about your test results, treatment options, and if necessary, the need for more tests. Vaccines  Your health care provider may recommend certain vaccines, such as: Influenza vaccine. This is recommended every year. Tetanus, diphtheria, and acellular pertussis (Tdap, Td) vaccine. You may need a Td booster every 10 years. Zoster vaccine. You may need this after age 58. Pneumococcal 13-valent conjugate (PCV13) vaccine. One dose is recommended after age 78. Pneumococcal polysaccharide (PPSV23) vaccine. One dose is recommended after age 67. Talk to your health care provider about which screenings and vaccines you need and how often you need them. This information is not intended to replace advice given to you by your health care provider. Make sure you discuss any questions you have with your health care provider. Document Released: 04/28/2015 Document Revised: 12/20/2015 Document Reviewed: 01/31/2015 Elsevier Interactive Patient Education  2017 [place] Prevention in the Home Falls can cause injuries. They can happen to people of all ages. There are many things you can do to make your home safe and to help prevent falls. What can I do on the outside of my home? Regularly fix the edges of walkways and driveways and fix any cracks. Remove anything that might make you trip as you walk through a door, such as a raised step or threshold. Trim any bushes or trees on the path to your home. Use bright outdoor lighting. Clear any walking paths of anything that might make someone trip, such as rocks or tools. Regularly check to see if handrails are loose or broken. Make sure that both sides of any steps have handrails. Any raised decks and porches should have guardrails on the edges. Have any leaves, snow, or ice cleared regularly. Use sand or salt on walking paths during winter. Clean up any spills in your garage right away. This includes oil or grease  spills. What can I do in the bathroom? Use night lights. Install grab bars by the toilet and in the tub and shower. Do not use towel bars as grab bars. Use non-skid mats or decals in the tub or shower. If you need to sit down in the shower, use a plastic, non-slip stool. Keep the floor dry. Clean up any water that spills on the floor as soon as it happens. Remove soap buildup in the tub or shower regularly. Attach bath mats securely with double-sided non-slip rug tape. Do not have throw rugs and other things on the floor that can make you trip. What can I do in the bedroom? Use night lights. Make sure that you have a light by your bed that is easy to reach. Do not use any sheets or blankets that are too big for your bed. They should not hang down onto the floor. Have a firm chair that has side arms. You can use this for support while you get dressed. Do not have throw rugs and other things on the floor that can make you trip. What can I do in the kitchen? Clean up any  spills right away. Avoid walking on wet floors. Keep items that you use a lot in easy-to-reach places. If you need to reach something above you, use a strong step stool that has a grab bar. Keep electrical cords out of the way. Do not use floor polish or wax that makes floors slippery. If you must use wax, use non-skid floor wax. Do not have throw rugs and other things on the floor that can make you trip. What can I do with my stairs? Do not leave any items on the stairs. Make sure that there are handrails on both sides of the stairs and use them. Fix handrails that are broken or loose. Make sure that handrails are as long as the stairways. Check any carpeting to make sure that it is firmly attached to the stairs. Fix any carpet that is loose or worn. Avoid having throw rugs at the top or bottom of the stairs. If you do have throw rugs, attach them to the floor with carpet tape. Make sure that you have a light switch at the  top of the stairs and the bottom of the stairs. If you do not have them, ask someone to add them for you. What else can I do to help prevent falls? Wear shoes that: Do not have high heels. Have rubber bottoms. Are comfortable and fit you well. Are closed at the toe. Do not wear sandals. If you use a stepladder: Make sure that it is fully opened. Do not climb a closed stepladder. Make sure that both sides of the stepladder are locked into place. Ask someone to hold it for you, if possible. Clearly mark and make sure that you can see: Any grab bars or handrails. First and last steps. Where the edge of each step is. Use tools that help you move around (mobility aids) if they are needed. These include: Canes. Walkers. Scooters. Crutches. Turn on the lights when you go into a dark area. Replace any light bulbs as soon as they burn out. Set up your furniture so you have a clear path. Avoid moving your furniture around. If any of your floors are uneven, fix them. If there are any pets around you, be aware of where they are. Review your medicines with your doctor. Some medicines can make you feel dizzy. This can increase your chance of falling. Ask your doctor what other things that you can do to help prevent falls. This information is not intended to replace advice given to you by your health care provider. Make sure you discuss any questions you have with your health care provider. Document Released: 01/26/2009 Document Revised: 09/07/2015 Document Reviewed: 05/06/2014 Elsevier Interactive Patient Education  2017 [employer].

## 2022-07-04 NOTE — Progress Notes (Signed)
I connected with  Justin [name] on 07/04/22 by a audio enabled telemedicine application and verified that I am speaking with the correct person using two identifiers.  Patient Location: Home  Provider Location: Office/Clinic  I discussed the limitations of evaluation and management by telemedicine. The patient expressed understanding and agreed to proceed.   Subjective:   Justin [name] is a 67 y.o. male who presents for Medicare Annual/Subsequent preventive examination.  Review of Systems     Cardiac Risk Factors include: advanced age (>91men, >15 women);diabetes mellitus;male gender;hypertension     Objective:    Today's Vitals   07/04/22 1423  Weight: 162 lb (73.5 kg)   Body mass index is 21.97 kg/m.     07/04/2022    2:28 PM 06/21/2021    2:36 PM 07/23/2020    3:39 PM 07/23/2020   12:12 PM  Advanced Directives  Does Patient Have a Medical Advance Directive? No No  No  Would patient like information on creating a medical advance directive? No - Patient declined Yes (MAU/Ambulatory/Procedural Areas - Information given) No - Patient declined     Current Medications (verified) Outpatient Encounter Medications as of 07/04/2022  Medication Sig   acetaminophen (TYLENOL) 500 MG tablet Take 1,000 mg by mouth every 6 (six) hours as needed for mild pain.   amLODipine (NORVASC) 2.5 MG tablet Take 1 tablet (2.5 mg total) by mouth daily.   atorvastatin (LIPITOR) 20 MG tablet TAKE 1 TABLET BY MOUTH EVERY DAY   benazepril (LOTENSIN) 20 MG tablet TAKE 1 TABLET BY MOUTH EVERY DAY   blood glucose meter kit and supplies KIT Dispense based on patient and insurance preference. Use up to four times daily as directed.   cetirizine (ZYRTEC) 10 MG tablet Take 10 mg by mouth daily as needed for allergies.   Dulaglutide (TRULICITY) 4.5 0000000 SOPN Inject 4.5 mg as directed once a week.   glucose blood (ONETOUCH VERIO) test strip Use as instructed   Multiple Vitamin (MULTIVITAMIN) capsule  Take 1 capsule by mouth daily.   ONETOUCH DELICA LANCETS FINE MISC 1 each by Does not apply route 2 (two) times daily.   SYNJARDY XR 12.08-998 MG TB24 TAKE 1 TABLET BY MOUTH TWICE A DAY   tadalafil (CIALIS) 20 MG tablet TAKE 1 TABLET (20 MG TOTAL) BY MOUTH EVERY OTHER DAY AS NEEDED FOR ERECTILE DYSFUNCTION.   No facility-administered encounter medications on file as of 07/04/2022.    Allergies (verified) Patient has no known allergies.   History: Past Medical History:  Diagnosis Date   Acute prostatitis 12/02/2009   Qualifier: Diagnosis of  By: Silvio Pate MD, [facility]    Allergy    AV block    ED (erectile dysfunction)    sildenafil 100 mg sparingly   History of colonic polyps    Hyperlipidemia    Hypertension    S/P placement of cardiac pacemaker    Symptomatic bradycardia    Type 2 diabetes mellitus 2007   Past Surgical History:  Procedure Laterality Date   None     PACEMAKER IMPLANT N/A 08/06/2020   Procedure: PACEMAKER IMPLANT;  Surgeon: Vickie Epley, MD;  Location: [facility];  Service: Cardiovascular;  Laterality: N/A;   Family History  Problem Relation Age of Onset   Hypertension Mother    Stroke Mother        16, first stroke 34. never smoker   Diabetes Mother        in late life  Dementia Father    CAD Father        CABG mid 58s, never smoker   Hypertension Sister    Diabetes Brother    Hypertension Brother    Prostate cancer Brother        89. died at 35   Pneumonia Maternal Grandfather        died from this   Diabetes Paternal Grandfather    Cerebral palsy Daughter    Cerebral palsy Daughter    Colon cancer Neg Hx    Social History   Socioeconomic History   Marital status: Married    Spouse name: Not on file   Number of children: Not on file   Years of education: Not on file   Highest education level: Not on file  Occupational History   Occupation: Retired  Tobacco Use   Smoking status: Never   Smokeless tobacco: Never  Vaping  Use   Vaping Use: Never used  Substance and Sexual Activity   Alcohol use: Yes    Comment: socially   Drug use: No   Sexual activity: Yes    Comment: wife only  Other Topics Concern   Not on file  Social History Narrative   Married. Identical twin daughters 1 in 2019- mirror twins- born 26 weeks. Both daughters have CP and are wheelchair bound - both live in [place] - graduated top of their classes and have MBA from [employer]. 1 daughters married- they all live together.       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       Hobbies: travel, walks and hikes, skiing       Social Determinants of Health   Financial Resource Strain: Low Risk  (07/04/2022)   Overall Financial Resource Strain (CARDIA)    Difficulty of Paying Living Expenses: Not hard at all  Food Insecurity: No Food Insecurity (07/04/2022)   Hunger Vital Sign    Worried About Running Out of Food in the Last Year: Never true    Ran Out of Food in the Last Year: Never true  Transportation Needs: No Transportation Needs (07/04/2022)   PRAPARE - Hydrologist (Medical): No    Lack of Transportation (Non-Medical): No  Physical Activity: Sufficiently Active (07/04/2022)   Exercise Vital Sign    Days of Exercise per Week: 5 days    Minutes of Exercise per Session: 30 min  Stress: No Stress Concern Present (07/04/2022)   [place]    Feeling of Stress : Not at all  Social Connections: Moderately Isolated (07/04/2022)   Social Connection and Isolation Panel [NHANES]    Frequency of Communication with Friends and Family: More than three times a week    Frequency of Social Gatherings with Friends and Family: More than three times a week    Attends Religious Services: Never    Marine scientist or Organizations: No    Attends Music therapist: Never    Marital Status: Married    Tobacco  Counseling Counseling given: Not Answered   Clinical Intake:  Pre-visit preparation completed: Yes  Pain : No/denies pain     BMI - recorded: 21.97 Nutritional Status: BMI of 19-24  Normal Nutritional Risks: None Diabetes: Yes CBG done?: No Did pt. bring in CBG monitor from home?: No  How often do you need to have someone help you when you read instructions, pamphlets, or other written materials  from your doctor or pharmacy?: 1 - Never  Diabetic?Nutrition Risk Assessment:  Has the patient had any N/V/D within the last 2 months?  No  Does the patient have any non-healing wounds?  No  Has the patient had any unintentional weight loss or weight gain?  No   Diabetes:  Is the patient diabetic?  Yes  If diabetic, was a CBG obtained today?  No  Did the patient bring in their glucometer from home?  No  How often do you monitor your CBG's? N/a.   Financial Strains and Diabetes Management:  Are you having any financial strains with the device, your supplies or your medication? No .  Does the patient want to be seen by Chronic Care Management for management of their diabetes?  No  Would the patient like to be referred to a Nutritionist or for Diabetic Management?  No   Diabetic Exams:  Diabetic Eye Exam: Completed 04/30/22 Diabetic Foot Exam: Completed 12/19/21   Interpreter Needed?: No  Information entered by :: Charlott Rakes, LPN   Activities of Daily Living    07/04/2022    2:30 PM  In your present state of health, do you have any difficulty performing the following activities:  Hearing? 0  Vision? 0  Difficulty concentrating or making decisions? 0  Walking or climbing stairs? 0  Dressing or bathing? 0  Doing errands, shopping? 0  Preparing Food and eating ? N  Using the Toilet? N  In the past six months, have you accidently leaked urine? N  Do you have problems with loss of bowel control? N  Managing your Medications? N  Managing your Finances? N   Housekeeping or managing your Housekeeping? N    Patient Care Team: Marin Olp, MD as PCP - General (Family Medicine) Donato Heinz, MD as PCP - Cardiology (Cardiology) Vickie Epley, MD as PCP - Electrophysiology (Cardiology)  Indicate any recent Medical Services you may have received from other than [facility] providers in the past year (date may be approximate).     Assessment:   This is a routine wellness examination for Justin [name].  Hearing/Vision screen Hearing Screening - Comments:: Pt denies any hearing issues  Vision Screening - Comments:: Pt follows up with Dr Peter Garter for annual eye exams   Dietary issues and exercise activities discussed: Current Exercise Habits: Home exercise routine, Type of exercise: walking, Time (Minutes): 30, Frequency (Times/Week): 5, Weekly Exercise (Minutes/Week): 150   Goals Addressed             This Visit's Progress    Patient Stated       Get A1C down and maintain weight        Depression Screen    07/04/2022    2:28 PM 06/21/2021    2:34 PM 09/12/2020   12:58 PM 08/04/2020    2:22 PM 10/28/2019    3:13 PM 11/24/2018   10:46 AM 02/02/2018    1:42 PM  PHQ 2/9 Scores  PHQ - 2 Score 0 0 0 0 0 0 0  PHQ- 9 Score   0 0 0      Fall Risk    07/04/2022    2:30 PM 08/21/2021    3:30 PM 06/21/2021    2:36 PM 09/12/2020   12:57 PM 08/04/2020    2:22 PM  [place] in the past year? 0 0 0 0 0  Number falls in past yr: 0 0 0 0 0  Injury with  Fall? 0 0 0 0 0  Risk for fall due to : Impaired vision No Fall Risks Impaired vision    Follow up Falls prevention discussed Falls evaluation completed Falls prevention discussed      FALL RISK PREVENTION PERTAINING TO THE HOME:  Any stairs in or around the home? Yes  If so, are there any without handrails? No  Home free of loose throw rugs in walkways, pet beds, electrical cords, etc? Yes  Adequate lighting in your home to reduce risk of falls? Yes   ASSISTIVE DEVICES UTILIZED  TO PREVENT FALLS:  Life alert? No  Use of a cane, walker or w/c? No  Grab bars in the bathroom? Yes  Shower chair or bench in shower? Yes  Elevated toilet seat or a handicapped toilet? No   TIMED UP AND GO:  Was the test performed? No .   Cognitive Function:        07/04/2022    2:31 PM 06/21/2021    2:38 PM  6CIT Screen  What Year? 0 points 0 points  What month? 0 points 0 points  What time? 0 points 0 points  Count back from 20 0 points 0 points  Months in reverse 0 points 0 points  Repeat phrase 0 points 0 points  Total Score 0 points 0 points    Immunizations Immunization History  Administered Date(s) Administered   Fluad Quad(high Dose 65+) 02/02/2021   Influenza Inj Mdck Quad With Preservative 02/13/2019   Influenza Split 12/26/2010, 01/02/2012   Influenza Whole 04/15/2005, 01/18/2008, 02/13/2010   Influenza, High Dose Seasonal PF 01/30/2022   Influenza,inj,Quad PF,6+ Mos 01/04/2013, 02/22/2014, 02/21/2015, 01/29/2016, 01/06/2017, 02/02/2018   PFIZER Comirnaty(Gray Top)Covid-19 Tri-Sucrose Vaccine 09/28/2020   PFIZER(Purple Top)SARS-COV-2 Vaccination 07/05/2019, 07/26/2019, 02/09/2020   PNEUMOCOCCAL CONJUGATE-20 09/12/2020   Pfizer Covid-19 Vaccine Bivalent Booster 57yrs & up 02/19/2021   Pneumococcal Polysaccharide-23 02/02/2018   Tdap 05/04/2012   Zoster Recombinat (Shingrix) 02/15/2020, 05/19/2020    TDAP status: Due, Education has been provided regarding the importance of this vaccine. Advised may receive this vaccine at local pharmacy or Health Dept. Aware to provide a copy of the vaccination record if obtained from local pharmacy or Health Dept. Verbalized acceptance and understanding.  Flu Vaccine status: Up to date  Pneumococcal vaccine status: Up to date  Covid-19 vaccine status: Completed vaccines  Qualifies for Shingles Vaccine? Yes   Zostavax completed Yes   Shingrix Completed?: Yes  Screening Tests Health Maintenance  Topic Date Due    COVID-19 Vaccine (6 - 2023-24 season) 12/14/2021   DTaP/Tdap/Td (2 - Td or Tdap) 05/04/2022   HEMOGLOBIN A1C  10/22/2022   FOOT EXAM  12/20/2022   COLONOSCOPY (Pts 45-15yrs Insurance coverage will need to be confirmed)  01/06/2023   Diabetic kidney evaluation - eGFR measurement  04/24/2023   Diabetic kidney evaluation - Urine ACR  04/24/2023   OPHTHALMOLOGY EXAM  05/01/2023   Medicare Annual Wellness (AWV)  07/04/2023   Pneumonia Vaccine 76+ Years old  Completed   INFLUENZA VACCINE  Completed   Hepatitis C Screening  Completed   Zoster Vaccines- Shingrix  Completed   HPV VACCINES  Aged Out    Health Maintenance  Health Maintenance Due  Topic Date Due   COVID-19 Vaccine (6 - 2023-24 season) 12/14/2021   DTaP/Tdap/Td (2 - Td or Tdap) 05/04/2022    Colorectal cancer screening: Type of screening: Colonoscopy. Completed 01/05/13. Repeat every 10 years  Additional Screening:  Hepatitis C Screening:  Completed 07/29/17  Vision Screening: Recommended annual ophthalmology exams for early detection of glaucoma and other disorders of the eye. Is the patient up to date with their annual eye exam?  Yes  Who is the provider or what is the name of the office in which the patient attends annual eye exams? Dr Peter Garter  If pt is not established with a provider, would they like to be referred to a provider to establish care? No .   Dental Screening: Recommended annual dental exams for proper oral hygiene  Community Resource Referral / Chronic Care Management: CRR required this visit?  No   CCM required this visit?  No      Plan:     I have personally reviewed and noted the following in the patient's chart:   Medical and social history Use of alcohol, tobacco or illicit drugs  Current medications and supplements including opioid prescriptions. Patient is not currently taking opioid prescriptions. Functional ability and status Nutritional status Physical activity Advanced directives List  of other physicians Hospitalizations, surgeries, and ER visits in previous 12 months Vitals Screenings to include cognitive, depression, and falls Referrals and appointments  In addition, I have reviewed and discussed with patient certain preventive protocols, quality metrics, and best practice recommendations. A written personalized care plan for preventive services as well as general preventive health recommendations were provided to patient.     Willette Brace, LPN   [id number]   Nurse Notes: none

## 2022-07-08 ENCOUNTER — Other Ambulatory Visit (HOSPITAL_BASED_OUTPATIENT_CLINIC_OR_DEPARTMENT_OTHER): Payer: Self-pay

## 2022-07-09 ENCOUNTER — Other Ambulatory Visit (HOSPITAL_BASED_OUTPATIENT_CLINIC_OR_DEPARTMENT_OTHER): Payer: Self-pay

## 2022-07-17 ENCOUNTER — Ambulatory Visit (HOSPITAL_COMMUNITY): Payer: Medicare PPO | Attending: Cardiology

## 2022-07-17 DIAGNOSIS — I5022 Chronic systolic (congestive) heart failure: Secondary | ICD-10-CM | POA: Diagnosis not present

## 2022-07-17 MED ORDER — PERFLUTREN LIPID MICROSPHERE
1.0000 mL | INTRAVENOUS | Status: AC | PRN
  Administered 2022-07-17: 2 mL via INTRAVENOUS

## 2022-07-18 LAB — ECHOCARDIOGRAM LIMITED
Area-P 1/2: 4.8 cm2
Calc EF: 54.2 %
Est EF: 50
P 1/2 time: 384 msec
S' Lateral: 1.9 cm
Single Plane A2C EF: 61.2 %
Single Plane A4C EF: 44.3 %

## 2022-07-22 NOTE — Progress Notes (Unsigned)
  Electrophysiology Office Follow up Visit Note:    Date:  07/23/2022   ID:  Justin [name], DOB [birth year], MRN [id number]  PCP:  Shelva Majestic, MD  [facility] Cardiologist:  Little Ishikawa, MD  [facility] Electrophysiologist:  Lanier Prude, MD    Interval History:    Justin [name] is a 67 y.o. male who presents for a follow up visit.   Last seen 01/09/2022. He has a PPM. At the last appointment I ordered an echo to assess his LV function given he was pacing 100%.        Past medical, surgical, social and family history were reviewed.  ROS:   Please see the history of present illness.    All other systems reviewed and are negative.  EKGs/Labs/Other Studies Reviewed:    The following studies were reviewed today:  07/17/2022 echo  EF 50 RV normal Trivial MR  07/23/2022 in clinic device interrogation personally reviewed Battery longevity 11.1 years Lead parameter stable 100% V pacing Atrial pacing less than 0.1% 0 AF episodes Histograms appropriate  Physical Exam:    VS:  BP 134/60   Pulse 62   Ht 6' (1.829 m)   Wt 161 lb 9.6 oz (73.3 kg)   SpO2 97%   BMI 21.92 kg/m     Wt Readings from Last 3 Encounters:  07/23/22 161 lb 9.6 oz (73.3 kg)  07/04/22 162 lb (73.5 kg)  04/23/22 162 lb 12.8 oz (73.8 kg)     GEN:  Well nourished, well developed in no acute distress CARDIAC: RRR, no murmurs, rubs, gallops.  Prepectoral pocket well-healed RESPIRATORY:  Clear to auscultation without rales, wheezing or rhonchi       ASSESSMENT:    1. Heart block AV second degree   2. Cardiac pacemaker in situ   3. Primary hypertension    PLAN:    In order of problems listed above:  #Bradycardia #Pacemaker in situ Device functioning appropriately. Continue remote monitoring.  #Hypertension At goal today.  Recommend checking blood pressures 1-2 times per week at home and recording the values.  Recommend bringing these recordings to the  primary care physician.    Follow up 1 year with APP.        Signed, Steffanie Dunn, MD, [facility], [facility] 07/23/2022 3:24 PM    Electrophysiology [facility]

## 2022-07-23 ENCOUNTER — Encounter: Payer: Self-pay | Admitting: Cardiology

## 2022-07-23 ENCOUNTER — Ambulatory Visit: Payer: Medicare PPO | Attending: Cardiology | Admitting: Cardiology

## 2022-07-23 VITALS — BP 134/60 | HR 62 | Ht 72.0 in | Wt 161.6 lb

## 2022-07-23 DIAGNOSIS — I441 Atrioventricular block, second degree: Secondary | ICD-10-CM

## 2022-07-23 DIAGNOSIS — I10 Essential (primary) hypertension: Secondary | ICD-10-CM

## 2022-07-23 DIAGNOSIS — Z950 Presence of cardiac pacemaker: Secondary | ICD-10-CM | POA: Diagnosis not present

## 2022-07-23 NOTE — Patient Instructions (Signed)
Medication Instructions:  Your physician recommends that you continue on your current medications as directed. Please refer to the Current Medication list given to you today.  *If you need a refill on your cardiac medications before your next appointment, please call your pharmacy*  Follow-Up: At [facility], you and your health needs are our priority.  As part of our continuing mission to provide you with exceptional heart care, we have created designated Provider Care Teams.  These Care Teams include your primary Cardiologist (physician) and Advanced Practice Providers (APPs -  Physician Assistants and Nurse Practitioners) who all work together to provide you with the care you need, when you need it.  Your next appointment:   1 year(s)  Provider:   You will see one of the following Advanced Practice Providers on your designated Care Team:   Renee Ursuy, PA-C Michael "Andy" Tillery, PA-C Suzann Riddle, NP  

## 2022-08-05 ENCOUNTER — Ambulatory Visit (INDEPENDENT_AMBULATORY_CARE_PROVIDER_SITE_OTHER): Payer: Medicare PPO

## 2022-08-05 DIAGNOSIS — I441 Atrioventricular block, second degree: Secondary | ICD-10-CM | POA: Diagnosis not present

## 2022-08-06 LAB — CUP PACEART REMOTE DEVICE CHECK
Battery Remaining Longevity: 132 mo
Battery Voltage: 3.01 V
Brady Statistic AP VP Percent: 0.01 %
Brady Statistic AP VS Percent: 0 %
Brady Statistic AS VP Percent: 99.94 %
Brady Statistic AS VS Percent: 0.05 %
Brady Statistic RA Percent Paced: 0.01 %
Brady Statistic RV Percent Paced: 99.95 %
Date Time Interrogation Session: 20240421191557
Implantable Lead Connection Status: 753985
Implantable Lead Connection Status: 753985
Implantable Lead Implant Date: 20220424
Implantable Lead Implant Date: 20220424
Implantable Lead Location: 753859
Implantable Lead Location: 753860
Implantable Lead Model: 3830
Implantable Lead Model: 5076
Implantable Pulse Generator Implant Date: 20220424
Lead Channel Impedance Value: 304 Ohm
Lead Channel Impedance Value: 361 Ohm
Lead Channel Impedance Value: 418 Ohm
Lead Channel Impedance Value: 608 Ohm
Lead Channel Pacing Threshold Amplitude: 0.75 V
Lead Channel Pacing Threshold Amplitude: 0.875 V
Lead Channel Pacing Threshold Pulse Width: 0.4 ms
Lead Channel Pacing Threshold Pulse Width: 0.4 ms
Lead Channel Sensing Intrinsic Amplitude: 15.125 mV
Lead Channel Sensing Intrinsic Amplitude: 17.25 mV
Lead Channel Sensing Intrinsic Amplitude: 3 mV
Lead Channel Sensing Intrinsic Amplitude: 3 mV
Lead Channel Setting Pacing Amplitude: 1.75 V
Lead Channel Setting Pacing Amplitude: 2 V
Lead Channel Setting Pacing Pulse Width: 0.4 ms
Lead Channel Setting Sensing Sensitivity: 2 mV
Zone Setting Status: 755011

## 2022-08-22 ENCOUNTER — Ambulatory Visit: Payer: Medicare PPO | Admitting: Family Medicine

## 2022-08-27 ENCOUNTER — Telehealth: Payer: Self-pay | Admitting: Family Medicine

## 2022-08-27 ENCOUNTER — Other Ambulatory Visit (HOSPITAL_BASED_OUTPATIENT_CLINIC_OR_DEPARTMENT_OTHER): Payer: Self-pay

## 2022-08-27 MED ORDER — TRULICITY 1.5 MG/0.5ML ~~LOC~~ SOAJ
1.5000 mg | SUBCUTANEOUS | 5 refills | Status: DC
  Filled 2022-08-27: qty 2, 28d supply, fill #0

## 2022-08-27 MED ORDER — TRULICITY 3 MG/0.5ML ~~LOC~~ SOAJ
3.0000 mg | SUBCUTANEOUS | 5 refills | Status: DC
  Filled 2022-08-27: qty 2, 28d supply, fill #0

## 2022-08-27 NOTE — Telephone Encounter (Signed)
sent 

## 2022-08-27 NOTE — Telephone Encounter (Signed)
See below

## 2022-08-27 NOTE — Telephone Encounter (Signed)
Pt states his pharmacy is out of  Dulaglutide (TRULICITY) 4.5 MG/0.5ML SOPN  But they do have the lower dosages. Can you send a script for 3mg  and 1.5mg . Please advise.   [facility] [place] [[id number]]  [place]

## 2022-08-28 ENCOUNTER — Other Ambulatory Visit (HOSPITAL_BASED_OUTPATIENT_CLINIC_OR_DEPARTMENT_OTHER): Payer: Self-pay

## 2022-09-03 ENCOUNTER — Other Ambulatory Visit (HOSPITAL_BASED_OUTPATIENT_CLINIC_OR_DEPARTMENT_OTHER): Payer: Self-pay

## 2022-09-10 NOTE — Progress Notes (Signed)
Remote pacemaker transmission.   

## 2022-09-12 ENCOUNTER — Ambulatory Visit: Payer: Medicare PPO | Admitting: Family Medicine

## 2022-09-12 ENCOUNTER — Encounter: Payer: Self-pay | Admitting: Family Medicine

## 2022-09-12 VITALS — BP 130/60 | HR 89 | Temp 97.4°F | Ht 72.0 in | Wt 157.2 lb

## 2022-09-12 DIAGNOSIS — I10 Essential (primary) hypertension: Secondary | ICD-10-CM | POA: Diagnosis not present

## 2022-09-12 DIAGNOSIS — Z7984 Long term (current) use of oral hypoglycemic drugs: Secondary | ICD-10-CM | POA: Diagnosis not present

## 2022-09-12 DIAGNOSIS — E119 Type 2 diabetes mellitus without complications: Secondary | ICD-10-CM | POA: Diagnosis not present

## 2022-09-12 LAB — COMPREHENSIVE METABOLIC PANEL
ALT: 11 U/L (ref 0–53)
AST: 12 U/L (ref 0–37)
Albumin: 4.4 g/dL (ref 3.5–5.2)
Alkaline Phosphatase: 53 U/L (ref 39–117)
BUN: 20 mg/dL (ref 6–23)
CO2: 25 mEq/L (ref 19–32)
Calcium: 9.5 mg/dL (ref 8.4–10.5)
Chloride: 104 mEq/L (ref 96–112)
Creatinine, Ser: 0.91 mg/dL (ref 0.40–1.50)
GFR: 87.4 mL/min (ref >60.00)
Glucose, Bld: 264 mg/dL — ABNORMAL HIGH (ref 70–99)
Potassium: 4.3 mEq/L (ref 3.5–5.1)
Sodium: 140 mEq/L (ref 135–145)
Total Bilirubin: 0.7 mg/dL (ref 0.2–1.2)
Total Protein: 7 g/dL (ref 6.0–8.3)

## 2022-09-12 LAB — HEMOGLOBIN A1C: Hgb A1c MFr Bld: 7.6 % — ABNORMAL HIGH (ref 4.6–6.5)

## 2022-09-12 NOTE — Progress Notes (Signed)
Phone [phone] In person visit   Subjective:   Justin [name] is a 67 y.o. year old very pleasant male patient who presents for/with See problem oriented charting Chief Complaint  Patient presents with   Medical Management of Chronic Issues   Diabetes    Past Medical History-  Patient Active Problem List   Diagnosis Date Noted   S/P placement of cardiac pacemaker 08/06/2020    Priority: High   Mobitz type 2 second degree AV block 08/04/2020    Priority: High   Diabetes mellitus without complication (HCC) 12/23/2006    Priority: High   Essential hypertension 12/23/2006    Priority: Medium    ED (erectile dysfunction)     Priority: Low   History of colonic polyps 01/18/2008    Priority: Low   RBBB 07/14/2018    Medications- reviewed and updated Current Outpatient Medications  Medication Sig Dispense Refill   acetaminophen (TYLENOL) 500 MG tablet Take 1,000 mg by mouth every 6 (six) hours as needed for mild pain.     amLODipine (NORVASC) 2.5 MG tablet Take 1 tablet (2.5 mg total) by mouth daily. 30 tablet 5   atorvastatin (LIPITOR) 20 MG tablet TAKE 1 TABLET BY MOUTH EVERY DAY 90 tablet 3   benazepril (LOTENSIN) 20 MG tablet TAKE 1 TABLET BY MOUTH EVERY DAY 90 tablet 3   blood glucose meter kit and supplies KIT Dispense based on patient and insurance preference. Use up to four times daily as directed. 1 each 3   cetirizine (ZYRTEC) 10 MG tablet Take 10 mg by mouth daily as needed for allergies.     Dulaglutide (TRULICITY) 4.5 MG/0.5ML SOPN Inject 4.5 mg as directed once a week. 2 mL 5   glucose blood (ONETOUCH VERIO) test strip Use as instructed 100 each 12   Multiple Vitamin (MULTIVITAMIN) capsule Take 1 capsule by mouth daily.     ONETOUCH DELICA LANCETS FINE MISC 1 each by Does not apply route 2 (two) times daily. 100 each 3   SYNJARDY XR 12.08-998 MG TB24 TAKE 1 TABLET BY MOUTH TWICE A DAY 180 tablet 3   tadalafil (CIALIS) 20 MG tablet TAKE 1 TABLET (20 MG TOTAL)  BY MOUTH EVERY OTHER DAY AS NEEDED FOR ERECTILE DYSFUNCTION. 6 tablet 19   No current facility-administered medications for this visit.     Objective:  BP 130/60   Pulse 89   Temp (!) 97.4 F (36.3 C)   Ht 6' (1.829 m)   Wt 157 lb 3.2 oz (71.3 kg)   SpO2 97%   BMI 21.32 kg/m  Gen: NAD, resting comfortably CV: RRR no murmurs rubs or gallops Lungs: CTAB no crackles, wheeze, rhonchi Ext: no edema Skin: warm, dry     Assessment and Plan   #social update- just lost dog. On vacation dog was not given heart medicines at kennel and ended up passing- at 16 yo . 1 dog still living.   #Second-degree AV block/symptomatic bradycardia-lead to postpartum pacemaker placement 08/06/2020.  Follows with Dr. Jerene Pitch primary cardiologist and Dr. Lalla Brothers of electrophysiology- transmissions normal lately   # Diabetes-A1c typically under 7 S: Medication: Synjardy extended release (Jardiance and metformin) 12.08-998 mg extended release twice daily, Trulicity 4.5 mg from 4 mg previously CBGs- not checking much Exercise and diet- 1-2 laps on block which is at least 3/4 miles or 1.5 miles, otherwise tries to get lots of steps- tries to use stairs intentionally. Down 4 lbs but we want to avoid weight loss. Could  add strength training  Lab Results  Component Value Date   HGBA1C 7.8 (H) 04/23/2022   HGBA1C 7.1 (A) 12/19/2021   HGBA1C 7.2 (H) 08/21/2021  A/P: hopefully improved- update a1c today. Continue current meds for now  -just got back from a cruise so that could affect #s  #hypertension S: medication: Benazepril 20 mg, amlodipine 2.5 mg -slight dry cough  Home readings #s: around 130s at home for most part and diastolic controlled  BP Readings from Last 3 Encounters:  09/12/22 130/60  07/23/22 134/60  04/23/22 (!) 142/66  A/P: stable- continue current medicines    #hyperlipidemia S: Medication: Atorvastatin 20 mg daily  Lab Results  Component Value Date   CHOL 108 01/01/2022   HDL 47.10  01/01/2022   LDLCALC 52 01/01/2022   LDLDIRECT 52.0 07/14/2018   TRIG 46.0 01/01/2022   CHOLHDL 2 01/01/2022    A/P:  well controlled- continue current medications    Recommended follow up: Return for next already scheduled visit or sooner if needed. Future Appointments  Date Time Provider Department Center  11/04/2022  7:00 AM [facility] DEVICE REMOTES [facility]  11/18/2022  1:30 PM Corrin Parker, PA-C [facility] None  12/24/2022  2:20 PM Shelva Majestic, MD [facility]  02/03/2023  7:00 AM [facility] DEVICE REMOTES [facility]  05/05/2023  7:00 AM [facility] DEVICE REMOTES [facility]  07/08/2023  3:00 PM [facility] ANNUAL WELLNESS VISIT 1 [facility]   Lab/Order associations:   ICD-10-CM   1. Diabetes mellitus without complication (HCC)  E11.9 Comprehensive metabolic panel    Hemoglobin A1c    2. Essential hypertension  I10       No orders of the defined types were placed in this encounter.   Return precautions advised.  Tana Conch, MD

## 2022-09-12 NOTE — Patient Instructions (Addendum)
Due for Tetanus, Diphtheria, and Pertussis (Tdap) at pharmacy  Please stop by lab before you go If you have mychart- we will send your results within 3 business days of [place] receiving them.  If you do not have mychart- we will call you about results within 5 business days of [place] receiving them.  *please also note that you will see labs on mychart as soon as they post. I will later go in and write notes on them- will say "notes from Dr. Durene Cal"   Health Maintenance Due  Topic Date Due   Colonoscopy  01/06/2023  Due later this year  Recommended follow up: Return for next already scheduled visit or sooner if needed.

## 2022-09-25 ENCOUNTER — Other Ambulatory Visit (HOSPITAL_BASED_OUTPATIENT_CLINIC_OR_DEPARTMENT_OTHER): Payer: Self-pay

## 2022-10-03 ENCOUNTER — Encounter: Payer: Self-pay | Admitting: Family Medicine

## 2022-10-12 IMAGING — DX DG CHEST 2V
2 series · 2 of 2 positions shown · non-contrast
Comparison: July 23, 2020

CLINICAL DATA: Chest pain history of diabetes and bradycardia

EXAM:
CHEST - 2 VIEW

[chest pa]
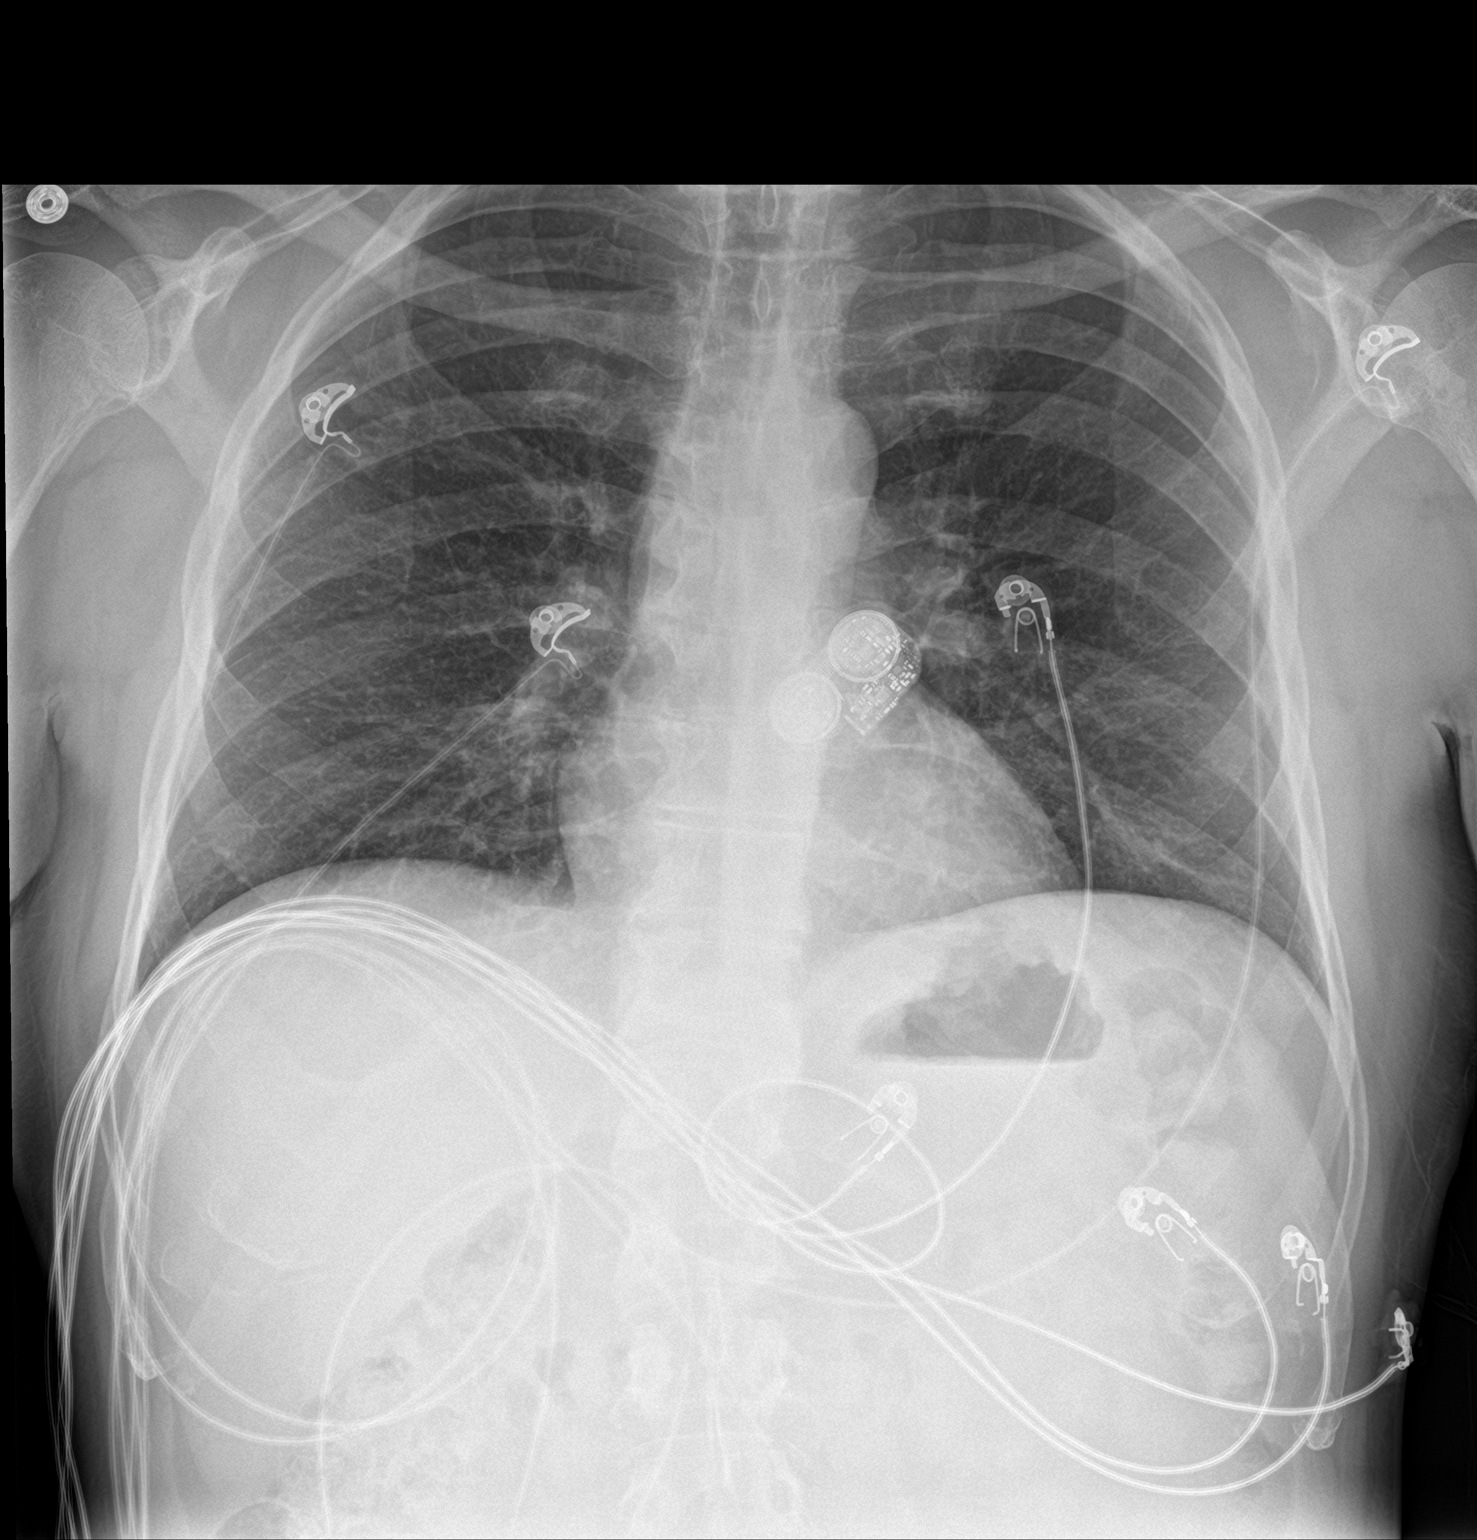

[chest lat]
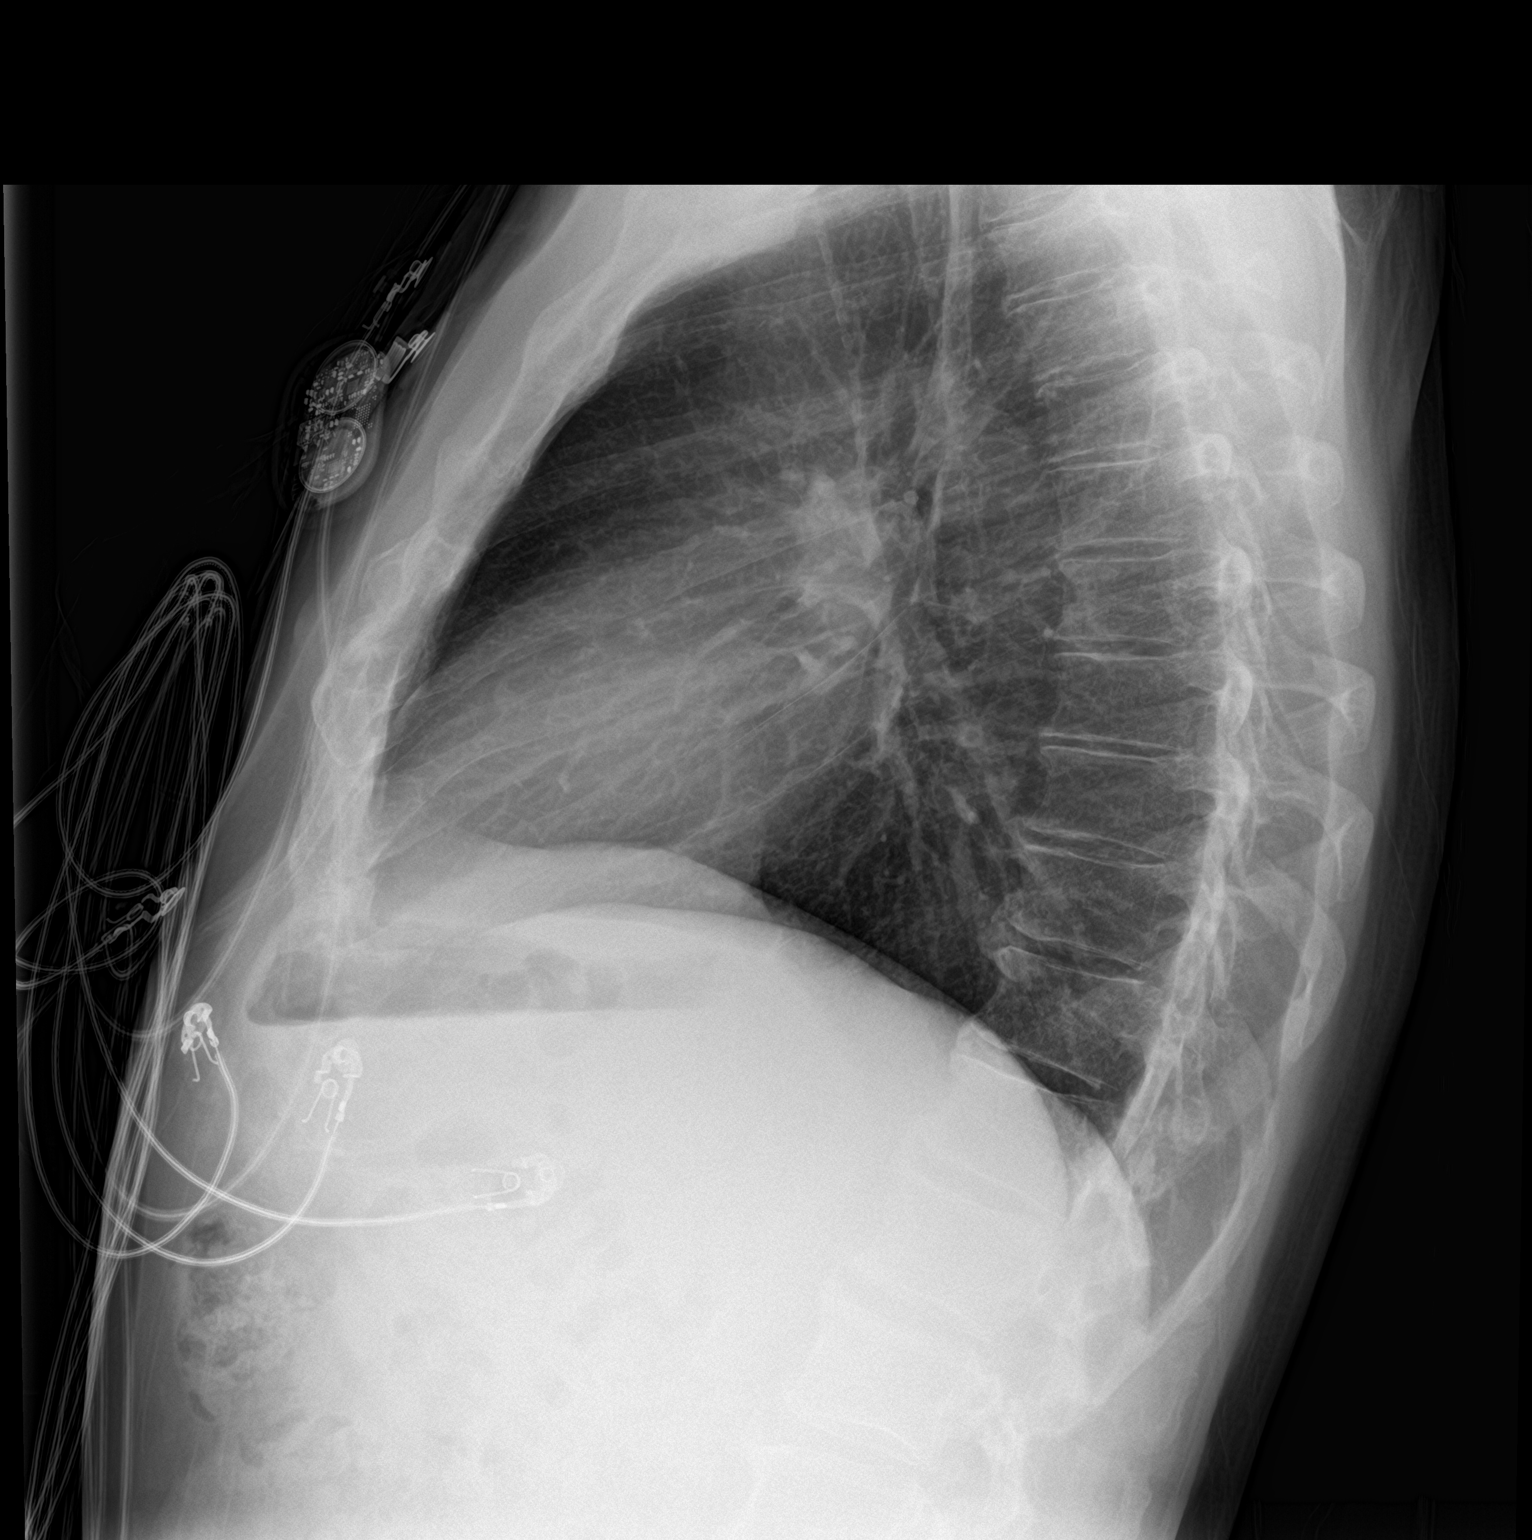

[2 of 2 positions shown; findings below may reference images not displayed]

FINDINGS: The heart size and mediastinal contours are within normal limits.
Cardiac monitoring device projects over the left paramedian
hemithorax. Bibasilar subsegmental atelectasis. No focal
consolidation. No pleural effusion. No pneumothorax. The visualized
skeletal structures are unremarkable.
IMPRESSION: No acute cardiopulmonary disease.

## 2022-10-21 ENCOUNTER — Other Ambulatory Visit (HOSPITAL_BASED_OUTPATIENT_CLINIC_OR_DEPARTMENT_OTHER): Payer: Self-pay

## 2022-11-04 ENCOUNTER — Ambulatory Visit (INDEPENDENT_AMBULATORY_CARE_PROVIDER_SITE_OTHER): Payer: Medicare PPO

## 2022-11-04 DIAGNOSIS — I441 Atrioventricular block, second degree: Secondary | ICD-10-CM | POA: Diagnosis not present

## 2022-11-05 LAB — CUP PACEART REMOTE DEVICE CHECK
Battery Remaining Longevity: 128 mo
Battery Voltage: 3.01 V
Brady Statistic AP VP Percent: 0.01 %
Brady Statistic AP VS Percent: 0 %
Brady Statistic AS VP Percent: 99.96 %
Brady Statistic AS VS Percent: 0.04 %
Brady Statistic RA Percent Paced: 0 %
Brady Statistic RV Percent Paced: 99.96 %
Date Time Interrogation Session: 20240721222142
Implantable Lead Connection Status: 753985
Implantable Lead Connection Status: 753985
Implantable Lead Implant Date: 20220424
Implantable Lead Implant Date: 20220424
Implantable Lead Location: 753859
Implantable Lead Location: 753860
Implantable Lead Model: 3830
Implantable Lead Model: 5076
Implantable Pulse Generator Implant Date: 20220424
Lead Channel Impedance Value: 304 Ohm
Lead Channel Impedance Value: 361 Ohm
Lead Channel Impedance Value: 399 Ohm
Lead Channel Impedance Value: 589 Ohm
Lead Channel Pacing Threshold Amplitude: 0.75 V
Lead Channel Pacing Threshold Amplitude: 1 V
Lead Channel Pacing Threshold Pulse Width: 0.4 ms
Lead Channel Pacing Threshold Pulse Width: 0.4 ms
Lead Channel Sensing Intrinsic Amplitude: 16.875 mV
Lead Channel Sensing Intrinsic Amplitude: 16.875 mV
Lead Channel Sensing Intrinsic Amplitude: 3.5 mV
Lead Channel Sensing Intrinsic Amplitude: 3.5 mV
Lead Channel Setting Pacing Amplitude: 1.75 V
Lead Channel Setting Pacing Amplitude: 2 V
Lead Channel Setting Pacing Pulse Width: 0.4 ms
Lead Channel Setting Sensing Sensitivity: 2 mV
Zone Setting Status: 755011

## 2022-11-11 ENCOUNTER — Other Ambulatory Visit: Payer: Self-pay | Admitting: Family Medicine

## 2022-11-14 NOTE — Progress Notes (Unsigned)
Cardiology Office Note:    Date:  11/18/2022   ID:  KHALIL SZCZEPANIK, DOB 1955-11-08, MRN [id number]  PCP:  Shelva Majestic, MD  Cardiologist:  Little Ishikawa, MD  Electrophysiologist:  Lanier Prude, MD   Referring MD: Shelva Majestic, MD   Chief Complaint: routine follow-up of high grade AV block  History of Present Illness:    Justin [name] is a 67 y.o. male with a history of symptomatic bradycardia with 2:1 AV block s/p PPM in 07/2020, mild aortic insufficiency on Echo in 07/2022, hypertension, hyperlipidemia, and type 2 diabetes mellitus who is followed by Dr. Bjorn Pippin and Dr. Lalla Brothers and presents today for routine follow-up.    Patient was admitted in 07/2020 with chest pain and found to have intermittent 2:1 AV block with heart rates dropping to the 40s. High-sensitivity troponin was negative. Echo showed LVEF of 65-70% with normal wall motion and grade 1 diastolic dysfunction. Chest pain was felt to be atypical. EP was consulted for bradycardia and recommended outpatient monitor. Monitor showed multiple episodes of Mobitz 2 AV block and high-grade AV block. He was readmitted later that month with worsening symptoms and underwent placement of dual chamber Medtronic PPM on 08/06/2020.  Patient was last seen by me in 11/2021 at which time he was doing well and denied any cardiac symptoms. Echo was done in 07/2022 to assess LV function given 100% ventricular pacing and it showed LVEF of 50% with moderate LVH, normal RV, and mild AI. He was last seen by Dr. Lalla Brothers in 07/2022 at which time he was doing well.  Patient presents today for follow-up. Here alone. He is doing well from a cardiac standpoint. He denies any chest pain, shortness of breath, orthopnea, PND, edema, or palpitations. He reports occasional mild lightheadedness/ dizziness if he is doing yard work in the heat and stands up quickly. However, this does not occur often. No syncope. He states he recently noticed  that his BP was spiking in the evenings so his PCP recommended taking 10mg  of his Benazepril twice daily (rather than 20mg  once daily). He started doing this about a month ago and states it has seemed to help. Systolic BP usually in the 120s to 130s but occasionally in the 140s. He is about to go vacation to [place] with his wife.  EKGs/Labs/Other Studies Reviewed:    The following studies were reviewed:  Echocardiogram 07/17/2022: Impressions:  1. Left ventricular ejection fraction, by estimation, is 50%. Left  ventricular ejection fraction by 2D MOD biplane is 54.2 %. The left  ventricle has low normal function. There is moderate left ventricular  hypertrophy. Indeterminate diastolic filling due   to E-A fusion. There is abnormal septal motion secondary to pacing.   2. Right ventricular systolic function is normal. The right ventricular  size is normal. Tricuspid regurgitation signal is inadequate for assessing  PA pressure.   3. The mitral valve is grossly normal. Trivial mitral valve  regurgitation.   4. The aortic valve is tricuspid. Aortic valve regurgitation is mild.   5. The inferior vena cava is normal in size with greater than 50%  respiratory variability, suggesting right atrial pressure of 3 mmHg.    EKG:  EKG  ordered today.  EKG Interpretation Date/Time:  Monday November 18 2022 13:37:55 EDT Ventricular Rate:  72 PR Interval:  238 QRS Duration:  152 QT Interval:  426 QTC Calculation: 466 R Axis:   -73  Text Interpretation: V-paced rhythm with 1st  degree A-V block No significant changes Confirmed by Marjie Skiff [phone]) on 11/18/2022 1:43:39 PM    Recent Labs: 01/01/2022: Hemoglobin 14.9; Platelets 222.0 09/12/2022: ALT 11; BUN 20; Creatinine, Ser 0.91; Potassium 4.3; Sodium 140  Recent Lipid Panel    Component Value Date/Time   CHOL 108 01/01/2022 0844   TRIG 46.0 01/01/2022 0844   HDL 47.10 01/01/2022 0844   CHOLHDL 2 01/01/2022 0844   VLDL 9.2 01/01/2022  0844   LDLCALC 52 01/01/2022 0844   LDLDIRECT 52.0 07/14/2018 1425    Physical Exam:    Vital Signs: BP 130/60 (BP Location: Left Arm, Patient Position: Sitting, Cuff Size: Normal)   Pulse 72   Ht 6' (1.829 m)   Wt 158 lb (71.7 kg)   SpO2 98%   BMI 21.43 kg/m     Wt Readings from Last 3 Encounters:  11/18/22 158 lb (71.7 kg)  09/12/22 157 lb 3.2 oz (71.3 kg)  07/23/22 161 lb 9.6 oz (73.3 kg)     General: 67 y.o. thin Caucasian male in no acute distress. HEENT: Normocephalic and atraumatic. Sclera clear.  Neck: Supple. No carotid bruits. No JVD. Heart: RRR. Distinct S1 and S2. No murmurs, gallops, or rubs.  Lungs: No increased work of breathing. Clear to ausculation bilaterally. No wheezes, rhonchi, or rales.  Abdomen: Soft, non-distended, and non-tender to palpation.  Extremities: No lower extremity edema.    Skin: Warm and dry. Neuro: Alert and oriented x3. No focal deficits. Psych: Normal affect. Responds appropriately.   Assessment:    1. Heart block AV second degree   2. Cardiac pacemaker in situ   3. Aortic valve insufficiency, etiology of cardiac valve disease unspecified   4. Primary hypertension   5. Hyperlipidemia, unspecified hyperlipidemia type   6. Type 2 diabetes mellitus without complication, without long-term current use of insulin (HCC)     Plan:    History of Symptomatic Bradycardia with 2:1 AV Block S/p Dual Chamber Medtronic PPM S/p PPM in 07/2020. He is now 100% ventricular paced. Recent Echo in 07/2022 showed LVEF of 50% with moderate LVH.  - Doing very well. No symptoms.  - Device is followed by Dr. Lalla Brothers.   Mild Aortic Insufficiency Noted on recent Echo in 07/2022.  - Can continue to follow with routine serial Echos as an outpatient. Consider repeat Echo in about 3 years.   Hypertension BP well controlled in the office today. However, he reports that his BP has been spiking some in the evenings. He has altered how he takes his Benazepril  (see HPI) and that has helped. Systolic BP usually in the 120s to 130s at home (sometimes in the 140s). - Continue current medications: Amlodipine 2.5mg  daily and Benazepril 10mg  twice daily. - Renal function and potassium stable on most recent labs in 08/2022.  - Asked patient to check his BP twice a day about 2 hours after taking his medications for 1 week and then send [place] a log of this via MyChart. If BP consistently >130/80, will increase Amlodipine to 5mg  once daily.   Hyperlipidemia Most recent lipid panel in 12/2021: Total Cholesterol 108, Triglycerides 46, HDL 57, LDL 52.  - Continue Lipitor 20mg  daily.  - Followed by PCP.   Type 2 Diabetes Mellitus Hemoglobin A1c 7.6% in 08/2022. - On Synjardy XR and Trulicity. - Managed by PCP.  Disposition: Follow up in 1 year.    Medication Adjustments/Labs and Tests Ordered: Current medicines are reviewed at length with the patient today.  Concerns regarding medicines are outlined above.  Orders Placed This Encounter  Procedures   EKG 12-Lead   No orders of the defined types were placed in this encounter.   Patient Instructions  Medication Instructions:  Continue to take the Benazapril 20mg . You will take a half of tablet twice daily. Monitor you blood pressure for one week. Please upload to mychart. *If you need a refill on your cardiac medications before your next appointment, please call your pharmacy*   Lab Work: none If you have labs (blood work) drawn today and your tests are completely normal, you will receive your results only by: MyChart Message (if you have MyChart) OR A paper copy in the mail If you have any lab test that is abnormal or we need to change your treatment, we will call you to review the results.   Testing/Procedures: none   Follow-Up: At [facility], you and your health needs are our priority.  As part of our continuing mission to provide you with exceptional heart care, we have created  designated Provider Care Teams.  These Care Teams include your primary Cardiologist (physician) and Advanced Practice Providers (APPs -  Physician Assistants and Nurse Practitioners) who all work together to provide you with the care you need, when you need it.  We recommend signing up for the patient portal called "MyChart".  Sign up information is provided on this After Visit Summary.  MyChart is used to connect with patients for Virtual Visits (Telemedicine).  Patients are able to view lab/test results, encounter notes, upcoming appointments, etc.  Non-urgent messages can be sent to your provider as well.   To learn more about what you can do with MyChart, go to [email or web address].    Your next appointment:   1 year(s)  Provider:   Marjie Skiff, PA-C  or  Little Ishikawa, MD   Other Instructions A letter will be mailed to you as a reminder to call the office for the one year follow up.    Signed, Corrin Parker, PA-C  11/18/2022 2:07 PM    [facility]

## 2022-11-14 NOTE — Progress Notes (Signed)
Remote pacemaker transmission.   

## 2022-11-18 ENCOUNTER — Encounter: Payer: Self-pay | Admitting: Student

## 2022-11-18 ENCOUNTER — Other Ambulatory Visit: Payer: Self-pay | Admitting: Family Medicine

## 2022-11-18 ENCOUNTER — Other Ambulatory Visit (HOSPITAL_BASED_OUTPATIENT_CLINIC_OR_DEPARTMENT_OTHER): Payer: Self-pay

## 2022-11-18 ENCOUNTER — Ambulatory Visit: Payer: Medicare PPO | Attending: Student | Admitting: Student

## 2022-11-18 VITALS — BP 130/60 | HR 72 | Ht 72.0 in | Wt 158.0 lb

## 2022-11-18 DIAGNOSIS — I441 Atrioventricular block, second degree: Secondary | ICD-10-CM | POA: Diagnosis not present

## 2022-11-18 DIAGNOSIS — I10 Essential (primary) hypertension: Secondary | ICD-10-CM | POA: Diagnosis not present

## 2022-11-18 DIAGNOSIS — E119 Type 2 diabetes mellitus without complications: Secondary | ICD-10-CM | POA: Diagnosis not present

## 2022-11-18 DIAGNOSIS — I351 Nonrheumatic aortic (valve) insufficiency: Secondary | ICD-10-CM | POA: Diagnosis not present

## 2022-11-18 DIAGNOSIS — Z950 Presence of cardiac pacemaker: Secondary | ICD-10-CM | POA: Diagnosis not present

## 2022-11-18 DIAGNOSIS — E785 Hyperlipidemia, unspecified: Secondary | ICD-10-CM

## 2022-11-18 MED ORDER — BENAZEPRIL HCL 20 MG PO TABS
10.0000 mg | ORAL_TABLET | Freq: Two times a day (BID) | ORAL | 3 refills | Status: DC
  Filled 2022-11-18: qty 90, 90d supply, fill #0
  Filled 2023-02-12 – 2023-04-28 (×6): qty 90, 90d supply, fill #1

## 2022-11-18 NOTE — Patient Instructions (Addendum)
Medication Instructions:  Continue to take the Benazapril 20mg . You will take a half of tablet twice daily. Monitor you blood pressure for one week. Please upload to mychart. *If you need a refill on your cardiac medications before your next appointment, please call your pharmacy*   Lab Work: none If you have labs (blood work) drawn today and your tests are completely normal, you will receive your results only by: MyChart Message (if you have MyChart) OR A paper copy in the mail If you have any lab test that is abnormal or we need to change your treatment, we will call you to review the results.   Testing/Procedures: none   Follow-Up: At [facility], you and your health needs are our priority.  As part of our continuing mission to provide you with exceptional heart care, we have created designated Provider Care Teams.  These Care Teams include your primary Cardiologist (physician) and Advanced Practice Providers (APPs -  Physician Assistants and Nurse Practitioners) who all work together to provide you with the care you need, when you need it.  We recommend signing up for the patient portal called "MyChart".  Sign up information is provided on this After Visit Summary.  MyChart is used to connect with patients for Virtual Visits (Telemedicine).  Patients are able to view lab/test results, encounter notes, upcoming appointments, etc.  Non-urgent messages can be sent to your provider as well.   To learn more about what you can do with MyChart, go to [email or web address].    Your next appointment:   1 year(s)  Provider:   Marjie Skiff, PA-C  or  Little Ishikawa, MD   Other Instructions A letter will be mailed to you as a reminder to call the office for the one year follow up.

## 2022-11-19 ENCOUNTER — Other Ambulatory Visit (HOSPITAL_BASED_OUTPATIENT_CLINIC_OR_DEPARTMENT_OTHER): Payer: Self-pay

## 2022-11-19 MED ORDER — TRULICITY 4.5 MG/0.5ML ~~LOC~~ SOAJ
4.5000 mg | SUBCUTANEOUS | 5 refills | Status: DC
  Filled 2022-11-19: qty 2, 28d supply, fill #0
  Filled 2022-12-05 – 2022-12-18 (×2): qty 2, 28d supply, fill #1
  Filled 2023-01-15: qty 2, 28d supply, fill #2
  Filled 2023-02-12: qty 2, 28d supply, fill #3
  Filled 2023-03-10: qty 2, 28d supply, fill #4

## 2022-11-28 ENCOUNTER — Encounter (INDEPENDENT_AMBULATORY_CARE_PROVIDER_SITE_OTHER): Payer: Self-pay

## 2022-11-28 NOTE — Telephone Encounter (Signed)
BP a little above goal. I would just have him increase his Amlodipine to 5mg  daily.  Thank you!

## 2022-12-02 MED ORDER — AMLODIPINE BESYLATE 5 MG PO TABS: 5.0000 mg | ORAL_TABLET | Freq: Every day | ORAL | 3 refills | Status: DC

## 2022-12-02 NOTE — Addendum Note (Signed)
Addended by: Alyson Ingles on: 12/02/2022 08:38 AM   Modules accepted: Orders

## 2022-12-05 ENCOUNTER — Other Ambulatory Visit (HOSPITAL_BASED_OUTPATIENT_CLINIC_OR_DEPARTMENT_OTHER): Payer: Self-pay

## 2022-12-18 ENCOUNTER — Other Ambulatory Visit: Payer: Self-pay

## 2022-12-24 ENCOUNTER — Ambulatory Visit (INDEPENDENT_AMBULATORY_CARE_PROVIDER_SITE_OTHER): Payer: Medicare PPO | Admitting: Family Medicine

## 2022-12-24 ENCOUNTER — Encounter: Payer: Self-pay | Admitting: Family Medicine

## 2022-12-24 VITALS — BP 128/56 | HR 88 | Temp 98.4°F | Ht 72.0 in | Wt 155.8 lb

## 2022-12-24 DIAGNOSIS — M79674 Pain in right toe(s): Secondary | ICD-10-CM | POA: Diagnosis not present

## 2022-12-24 DIAGNOSIS — I10 Essential (primary) hypertension: Secondary | ICD-10-CM

## 2022-12-24 DIAGNOSIS — E785 Hyperlipidemia, unspecified: Secondary | ICD-10-CM | POA: Diagnosis not present

## 2022-12-24 DIAGNOSIS — I441 Atrioventricular block, second degree: Secondary | ICD-10-CM

## 2022-12-24 DIAGNOSIS — Z125 Encounter for screening for malignant neoplasm of prostate: Secondary | ICD-10-CM

## 2022-12-24 DIAGNOSIS — E119 Type 2 diabetes mellitus without complications: Secondary | ICD-10-CM | POA: Diagnosis not present

## 2022-12-24 DIAGNOSIS — Z23 Encounter for immunization: Secondary | ICD-10-CM | POA: Diagnosis not present

## 2022-12-24 DIAGNOSIS — Z7984 Long term (current) use of oral hypoglycemic drugs: Secondary | ICD-10-CM

## 2022-12-24 DIAGNOSIS — Z1211 Encounter for screening for malignant neoplasm of colon: Secondary | ICD-10-CM

## 2022-12-24 DIAGNOSIS — Z0000 Encounter for general adult medical examination without abnormal findings: Secondary | ICD-10-CM

## 2022-12-24 NOTE — Progress Notes (Signed)
Phone: [phone]   Subjective:  Patient presents today for their annual physical. Chief complaint-noted.   See problem oriented charting- ROS- full  review of systems was completed and negative  except for: mild weight loss on Trulicity, corn on foot with recurrence  The following were reviewed and entered/updated in epic: Past Medical History:  Diagnosis Date   Acute prostatitis 12/02/2009   Qualifier: Diagnosis of  By: Alphonsus Sias MD, Ronnette Hila    Allergy    AV block    ED (erectile dysfunction)    sildenafil 100 mg sparingly   History of colonic polyps    Hyperlipidemia    Hypertension    S/P placement of cardiac pacemaker    Symptomatic bradycardia    Type 2 diabetes mellitus 2007   Patient Active Problem List   Diagnosis Date Noted   S/P placement of cardiac pacemaker 08/06/2020    Priority: High   Mobitz type 2 second degree AV block 08/04/2020    Priority: High   Diabetes mellitus without complication (HCC) 12/23/2006    Priority: High   Hyperlipidemia 02/02/2018    Priority: Medium    Essential hypertension 12/23/2006    Priority: Medium    ED (erectile dysfunction)     Priority: Low   History of colonic polyps 01/18/2008    Priority: Low   Aortic insufficiency 11/18/2022   RBBB 07/14/2018   Past Surgical History:  Procedure Laterality Date   None     PACEMAKER IMPLANT N/A 08/06/2020   Procedure: PACEMAKER IMPLANT;  Surgeon: Lanier Prude, MD;  Location: [facility];  Service: Cardiovascular;  Laterality: N/A;    Family History  Problem Relation Age of Onset   Hypertension Mother    Stroke Mother        69, first stroke 4. never smoker   Diabetes Mother        in late life   Dementia Father    CAD Father        CABG mid 60s, never smoker   Hypertension Sister    Diabetes Brother    Hypertension Brother    Prostate cancer Brother        54. died at 28   Pneumonia Maternal Grandfather        died from this   Diabetes Paternal  Grandfather    Cerebral palsy Daughter    Cerebral palsy Daughter    Colon cancer Neg Hx     Medications- reviewed and updated Current Outpatient Medications  Medication Sig Dispense Refill   acetaminophen (TYLENOL) 500 MG tablet Take 1,000 mg by mouth every 6 (six) hours as needed for mild pain.     amLODipine (NORVASC) 5 MG tablet Take 1 tablet (5 mg total) by mouth daily. 30 tablet 3   atorvastatin (LIPITOR) 20 MG tablet TAKE 1 TABLET BY MOUTH EVERY DAY 90 tablet 3   benazepril (LOTENSIN) 20 MG tablet Take 0.5 tablets (10 mg total) by mouth 2 (two) times daily. 180 tablet 3   blood glucose meter kit and supplies KIT Dispense based on patient and insurance preference. Use up to four times daily as directed. 1 each 3   cetirizine (ZYRTEC) 10 MG tablet Take 10 mg by mouth daily as needed for allergies.     Dulaglutide (TRULICITY) 4.5 MG/0.5ML SOPN Inject 4.5 mg as directed once a week. 2 mL 5   glucose blood (ONETOUCH VERIO) test strip Use as instructed 100 each 12   Multiple Vitamin (MULTIVITAMIN) capsule  Take 1 capsule by mouth daily.     ONETOUCH DELICA LANCETS FINE MISC 1 each by Does not apply route 2 (two) times daily. 100 each 3   SYNJARDY XR 12.08-998 MG TB24 TAKE 1 TABLET BY MOUTH TWICE A DAY 180 tablet 3   tadalafil (CIALIS) 20 MG tablet TAKE 1 TABLET (20 MG TOTAL) BY MOUTH EVERY OTHER DAY AS NEEDED FOR ERECTILE DYSFUNCTION. 6 tablet 19   No current facility-administered medications for this visit.    Allergies-reviewed and updated No Known Allergies  Social History   Social History Narrative   Married. Identical twin daughters 36 in 2019- mirror twins- born 55 weeks. Both daughters have CP and are wheelchair bound - both live in [place] - graduated top of their classes and have MBA from [employer]. 1 daughters married- they all live together.       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       Hobbies: travel, walks and hikes, skiing       Objective   Objective:  BP (!) 128/56   Pulse 88   Temp 98.4 F (36.9 C)   Ht 6' (1.829 m)   Wt 155 lb 12.8 oz (70.7 kg)   SpO2 98%   BMI 21.13 kg/m  Gen: NAD, resting comfortably HEENT: Mucous membranes are moist. Oropharynx normal Neck: no thyromegaly CV: RRR no murmurs rubs or gallops Lungs: CTAB no crackles, wheeze, rhonchi Abdomen: soft/nontender/nondistended/normal bowel sounds. No rebound or guarding.  Ext: no edema Skin: warm, dry Neuro: grossly normal, moves all extremities, PERRLA   Diabetic Foot Exam - Simple   Simple Foot Form Diabetic Foot exam was performed with the following findings: Yes 12/24/2022  2:55 PM  Visual Inspection No deformities, no ulcerations, no other skin breakdown bilaterally: Yes Sensation Testing Intact to touch and monofilament testing bilaterally: Yes Pulse Check Posterior Tibialis and Dorsalis pulse intact bilaterally: Yes Comments Small corn on 4th toe on lateral side where 5th toe rubs against it Callous on right MTP joint, otherwise normal foot exam        Assessment and Plan  67 y.o. male presenting for annual physical.  Health Maintenance counseling: 1. Anticipatory guidance: Patient counseled regarding regular dental exams -q4 months, eye exams -yearly,  avoiding smoking and second hand smoke , limiting alcohol to 2 beverages per day - 1 beverage per day, no illicit drugs .   2. Risk factor reduction:  Advised patient of need for regular exercise and diet rich and fruits and vegetables to reduce risk of heart attack and stroke.  Exercise- see diabetes below.  Diet/weight management-losing weight on Trulicity but hoping for stability.  Wt Readings from Last 3 Encounters:  12/24/22 155 lb 12.8 oz (70.7 kg)  11/18/22 158 lb (71.7 kg)  09/12/22 157 lb 3.2 oz (71.3 kg)  3. Immunizations/screenings/ancillary studies-discussed due for Tetanus, Diphtheria, and Pertussis (Tdap), flu shot today, got COVID shot before travel- may wait 4 months   Immunization History  Administered Date(s) Administered   Covid-19, Mrna,Vaccine(Spikevax)86yrs and older 11/18/2022   Fluad Quad(high Dose 65+) 02/02/2021   Fluad Trivalent(High Dose 65+) 12/24/2022   Influenza Inj Mdck Quad With Preservative 02/13/2019   Influenza Split 12/26/2010, 01/02/2012   Influenza Whole 04/15/2005, 01/18/2008, 02/13/2010   Influenza, High Dose Seasonal PF 01/30/2022   Influenza,inj,Quad PF,6+ Mos 01/04/2013, 02/22/2014, 02/21/2015, 01/29/2016, 01/06/2017, 02/02/2018   PFIZER Comirnaty(Gray Top)Covid-19 Tri-Sucrose Vaccine 09/28/2020   PFIZER(Purple Top)SARS-COV-2 Vaccination 07/05/2019, 07/26/2019, 02/09/2020   PNEUMOCOCCAL CONJUGATE-20 09/12/2020  Research officer, trade union 62yrs & up 02/19/2021   Pneumococcal Polysaccharide-23 02/02/2018   Tdap 05/04/2012   Zoster Recombinant(Shingrix) 02/15/2020, 05/19/2020  4. Prostate cancer screening- low risk prior PSA trend-update with labs today Lab Results  Component Value Date   PSA 0.23 08/21/2021   PSA 0.30 09/12/2020   PSA 0.41 11/24/2018   5. Colon cancer screening - now due for colonoscopy with last completion 01/02/2013-referred today 6. Skin cancer screening-no dermatologist. advised regular sunscreen use. Denies worrisome, changing, or new skin lesions.  7. Smoking associated screening (lung cancer screening, AAA screen 65-75, UA)-never smoker 8. STD screening - only active with wife  Status of chronic or acute concerns   # Social update-lost 67 year old on before last visit-still caring for his other living dog (other dog doing ok but 15)    #Second-degree AV block/symptomatic bradycardia-lead to postpartum pacemaker placement 08/06/2020.  Follows with Dr. Jerene Pitch primary cardiologist and Dr. Lalla Brothers of electrophysiology   # Diabetes-A1c typically under 7 S: Medication: Synjardy extended release (Jardiance and metformin) 12.08-998 mg extended release twice daily, Trulicity 4.5 mg. Mild  weight loss on this- we want him to be stable on weight but control sugar better CBGs- no recent checks Exercise and diet- getting good # of steps- with travel 10000 steps - outside of travel walking on street for 1- 1.5 miles   Lab Results  Component Value Date   HGBA1C 7.6 (H) 09/12/2022   HGBA1C 7.8 (H) 04/23/2022   HGBA1C 7.1 (A) 12/19/2021  A/P: hopefully improved- update a1c today. Continue current meds for now  -he would like to see endocrinology- they were short staffed previously but with new doctor on team we will refer at this time (prefers to sit down even if a1c at goal)   #hypertension S: medication: Benazepril 10 mg twice daily , amlodipine 5 mg -slight dry cough- better lately  Home readings #s: no recent checks   BP Readings from Last 3 Encounters:  12/24/22 (!) 128/56  11/18/22 130/60  09/12/22 130/60   A/P: stable- continue current medicines - amlodipine had increased to 5 mg and benazepril down to 10 mg - doing well   #hyperlipidemia S: Medication: Atorvastatin 20 mg daily  Lab Results  Component Value Date   CHOL 108 01/01/2022   HDL 47.10 01/01/2022   LDLCALC 52 01/01/2022   LDLDIRECT 52.0 07/14/2018   TRIG 46.0 01/01/2022   CHOLHDL 2 01/01/2022    A/P: hopefully stable- update lipid panel today. Continue current meds for now    #Erectile Dysfunction- Sildenafil 100 mg prn- mild improvement. Cialis- mild benefit - rarely uses- lack of opportunity  #corn on toe 4 laterally on right foot- he used corn remover and something came out but still having pain in area. We pared down his callous some but 5th toe clearly putting a lot of pressure on this- we ultimately decided to refer to podiatry- he also has a chronic almost circular lesoin on left ankle that we would like for them to look at - we can refer to dermatology if needed  Recommended follow up: Return in about 4 months (around 04/25/2023) for followup or sooner if needed.Schedule b4 you leave. Future  Appointments  Date Time Provider Department Center  02/03/2023  7:00 AM [facility] DEVICE REMOTES [facility]  05/05/2023  7:00 AM [facility] DEVICE REMOTES [facility]  07/08/2023  3:00 PM [facility] ANNUAL WELLNESS VISIT 1 [facility]  08/04/2023  7:00 AM [facility] DEVICE REMOTES [facility]  11/03/2023  7:00 AM [facility] DEVICE REMOTES [facility]  02/02/2024  7:00 AM [facility] DEVICE REMOTES [facility]  05/03/2024  7:00 AM [facility] DEVICE REMOTES [facility]  08/02/2024  7:00 AM [facility] DEVICE REMOTES [facility]  11/01/2024  7:00 AM [facility] DEVICE REMOTES [facility]  01/31/2025  7:00 AM [facility] DEVICE REMOTES [facility]  05/02/2025  7:00 AM [facility] DEVICE REMOTES [facility]   Lab/Order associations: NOT fasting   ICD-10-CM   1. Preventative health care  Z00.00     2. Encounter for immunization  Z23 Flu Vaccine Trivalent High Dose (Fluad)    3. Diabetes mellitus without complication (HCC)  E11.9     4. Mobitz type 2 second degree AV block  I44.1     5. Essential hypertension  I10     6. Hyperlipidemia, unspecified hyperlipidemia type  E78.5       No orders of the defined types were placed in this encounter.   Return precautions advised.  Tana Conch, MD

## 2022-12-24 NOTE — Patient Instructions (Addendum)
Tetanus, Diphtheria, and Pertussis (Tdap) recommended at the pharmacy  [facility] contact Please call to schedule visit and/or procedure Address: [address] [place], [place], [place] [address] Phone: [phone]  We have placed a referral for you today to podiatry and endocrinology. In some cases you will see # listed below- you can call this if you have not heard within a week. If you do not see # listed- you should receive a mychart message or phone call within a week with the # to call directly- call that as soon as you get it. If you are having issues getting scheduled reach out to [place] again.   Please stop by lab before you go If you have mychart- we will send your results within 3 business days of [place] receiving them.  If you do not have mychart- we will call you about results within 5 business days of [place] receiving them.  *please also note that you will see labs on mychart as soon as they post. I will later go in and write notes on them- will say "notes from Dr. Durene Cal"   Recommended follow up: Return in about 4 months (around 04/25/2023) for followup or sooner if needed.Schedule b4 you leave. - if a1c looks good or seeing endocrine we can do 6 month visit instead

## 2022-12-25 LAB — CBC WITH DIFFERENTIAL/PLATELET
Basophils Absolute: 0.1 10*3/uL (ref 0.0–0.1)
Basophils Relative: 0.8 % (ref 0.0–3.0)
Eosinophils Absolute: 0.3 10*3/uL (ref 0.0–0.7)
Eosinophils Relative: 4 % (ref 0.0–5.0)
HCT: 46 % (ref 39.0–52.0)
Hemoglobin: 15.2 g/dL (ref 13.0–17.0)
Lymphocytes Relative: 14.4 % (ref 12.0–46.0)
Lymphs Abs: 1.1 10*3/uL (ref 0.7–4.0)
MCHC: 33 g/dL (ref 30.0–36.0)
MCV: 93.9 fl (ref 78.0–100.0)
Monocytes Absolute: 0.5 10*3/uL (ref 0.1–1.0)
Monocytes Relative: 6.7 % (ref 3.0–12.0)
Neutro Abs: 5.8 10*3/uL (ref 1.4–7.7)
Neutrophils Relative %: 74.1 % (ref 43.0–77.0)
Platelets: 311 10*3/uL (ref 150.0–400.0)
RBC: 4.9 Mil/uL (ref 4.22–5.81)
RDW: 13.7 % (ref 11.5–15.5)
WBC: 7.9 10*3/uL (ref 4.0–10.5)

## 2022-12-25 LAB — LIPID PANEL
Cholesterol: 112 mg/dL (ref 0–200)
HDL: 45.7 mg/dL (ref >39.00)
LDL Cholesterol: 47 mg/dL (ref 0–99)
NonHDL: 65.82
Total CHOL/HDL Ratio: 2
Triglycerides: 96 mg/dL (ref 0.0–149.0)
VLDL: 19.2 mg/dL (ref 0.0–40.0)

## 2022-12-25 LAB — PSA, MEDICARE: PSA: 0.3 ng/mL (ref 0.10–4.00)

## 2022-12-25 LAB — COMPREHENSIVE METABOLIC PANEL
ALT: 23 U/L (ref 0–53)
AST: 21 U/L (ref 0–37)
Albumin: 4 g/dL (ref 3.5–5.2)
Alkaline Phosphatase: 48 U/L (ref 39–117)
BUN: 23 mg/dL (ref 6–23)
CO2: 27 meq/L (ref 19–32)
Calcium: 9.4 mg/dL (ref 8.4–10.5)
Chloride: 104 meq/L (ref 96–112)
Creatinine, Ser: 0.95 mg/dL (ref 0.40–1.50)
GFR: 82.83 mL/min (ref >60.00)
Glucose, Bld: 189 mg/dL — ABNORMAL HIGH (ref 70–99)
Potassium: 4.7 meq/L (ref 3.5–5.1)
Sodium: 140 meq/L (ref 135–145)
Total Bilirubin: 0.3 mg/dL (ref 0.2–1.2)
Total Protein: 6.6 g/dL (ref 6.0–8.3)

## 2022-12-25 LAB — HEMOGLOBIN A1C: Hgb A1c MFr Bld: 7.9 % — ABNORMAL HIGH (ref 4.6–6.5)

## 2022-12-31 ENCOUNTER — Encounter: Payer: Self-pay | Admitting: Gastroenterology

## 2023-01-02 ENCOUNTER — Encounter: Payer: Self-pay | Admitting: Gastroenterology

## 2023-01-09 ENCOUNTER — Other Ambulatory Visit (HOSPITAL_BASED_OUTPATIENT_CLINIC_OR_DEPARTMENT_OTHER): Payer: Self-pay

## 2023-01-09 ENCOUNTER — Ambulatory Visit: Payer: Medicare PPO | Admitting: Podiatry

## 2023-01-09 ENCOUNTER — Encounter: Payer: Self-pay | Admitting: Family Medicine

## 2023-01-09 DIAGNOSIS — E119 Type 2 diabetes mellitus without complications: Secondary | ICD-10-CM | POA: Diagnosis not present

## 2023-01-09 DIAGNOSIS — L84 Corns and callosities: Secondary | ICD-10-CM | POA: Diagnosis not present

## 2023-01-09 MED ORDER — UREA 10 % EX CREA
TOPICAL_CREAM | CUTANEOUS | 0 refills | Status: DC | PRN
  Filled 2023-01-09: qty 85, 30d supply, fill #0

## 2023-01-09 NOTE — Progress Notes (Signed)
Subjective:  Patient ID: Justin [name], male    DOB: Sep 16, 1955,  MRN: [id number]  Chief Complaint  Patient presents with   Callouses    Corn in between right 4th and 5th toe. Diabetic. Callus to the ball of right foot near the right hallux.     67 y.o. male presents with concern for callus in between the right fourth toe on the right foot.  Also has a painful lesion underneath the plantar aspect first metatarsal head right foot.  Does have a history of diabetes type 2 but denies significant neuropathy or numbness in his foot.  Past Medical History:  Diagnosis Date   Acute prostatitis 12/02/2009   Qualifier: Diagnosis of  By: Alphonsus Sias MD, Ronnette Hila    Allergy    AV block    ED (erectile dysfunction)    sildenafil 100 mg sparingly   History of colonic polyps    Hyperlipidemia    Hypertension    S/P placement of cardiac pacemaker    Symptomatic bradycardia    Type 2 diabetes mellitus 2007    No Known Allergies  ROS: Negative except as per HPI above  Objective:  General: AAO x3, NAD  Dermatological: Hyperkeratotic lesion plantar aspect first metatarsal head right foot underlying the first metatarsal head which is prominent plantarly.  Additionally there is interdigital hyperkeratotic lesion on the lateral aspect of the fourth toe right foot  Vascular:  Dorsalis Pedis artery and Posterior Tibial artery pedal pulses are 2/4 bilateral.  Capillary fill time < 3 sec to all digits.   Neruologic: Grossly intact via light touch bilateral. Protective threshold intact to all sites bilateral.   Musculoskeletal: No gross boney pedal deformities bilateral. No pain, crepitus, or limitation noted with foot and ankle range of motion bilateral. Muscular strength 5/5 in all groups tested bilateral.  Gait: Unassisted, Nonantalgic.   No images are attached to the encounter.   Assessment:   1. Pre-ulcerative calluses   2. Diabetes mellitus without complication (HCC)      Plan:   Patient was evaluated and treated and all questions answered.  # Preulcerative calluses right foot including first metatarsal head plantarly as well as lateral aspect fourth toe right foot -All symptomatic hyperkeratoses x2 were safely debrided with a sterile #15 blade to patient's level of comfort without incident. We discussed preventative and palliative care of these lesions including supportive and accommodative shoegear, padding including gel toe caps, prefabricated and custom molded accommodative orthoses, use of a pumice stone and lotions/creams daily. -E Rx for urea cream sent for plantar first metatarsal head lesion   Return in about 3 months (around 04/10/2023).          Corinna Gab, DPM [facility]

## 2023-01-10 ENCOUNTER — Other Ambulatory Visit (HOSPITAL_BASED_OUTPATIENT_CLINIC_OR_DEPARTMENT_OTHER): Payer: Self-pay

## 2023-01-13 ENCOUNTER — Other Ambulatory Visit (HOSPITAL_BASED_OUTPATIENT_CLINIC_OR_DEPARTMENT_OTHER): Payer: Self-pay

## 2023-02-03 ENCOUNTER — Ambulatory Visit: Payer: Medicare PPO | Attending: Cardiology

## 2023-02-03 DIAGNOSIS — I441 Atrioventricular block, second degree: Secondary | ICD-10-CM

## 2023-02-04 LAB — CUP PACEART REMOTE DEVICE CHECK
Battery Remaining Longevity: 124 mo
Battery Voltage: 3 V
Brady Statistic AP VP Percent: 0 %
Brady Statistic AP VS Percent: 0 %
Brady Statistic AS VP Percent: 99.96 %
Brady Statistic AS VS Percent: 0.03 %
Brady Statistic RA Percent Paced: 0 %
Brady Statistic RV Percent Paced: 99.97 %
Date Time Interrogation Session: 20241020191816
Implantable Lead Connection Status: 753985
Implantable Lead Connection Status: 753985
Implantable Lead Implant Date: 20220424
Implantable Lead Implant Date: 20220424
Implantable Lead Location: 753859
Implantable Lead Location: 753860
Implantable Lead Model: 3830
Implantable Lead Model: 5076
Implantable Pulse Generator Implant Date: 20220424
Lead Channel Impedance Value: 304 Ohm
Lead Channel Impedance Value: 361 Ohm
Lead Channel Impedance Value: 380 Ohm
Lead Channel Impedance Value: 551 Ohm
Lead Channel Pacing Threshold Amplitude: 0.75 V
Lead Channel Pacing Threshold Amplitude: 1 V
Lead Channel Pacing Threshold Pulse Width: 0.4 ms
Lead Channel Pacing Threshold Pulse Width: 0.4 ms
Lead Channel Sensing Intrinsic Amplitude: 3.375 mV
Lead Channel Sensing Intrinsic Amplitude: 3.375 mV
Lead Channel Sensing Intrinsic Amplitude: 4.875 mV
Lead Channel Sensing Intrinsic Amplitude: 4.875 mV
Lead Channel Setting Pacing Amplitude: 1.5 V
Lead Channel Setting Pacing Amplitude: 2 V
Lead Channel Setting Pacing Pulse Width: 0.4 ms
Lead Channel Setting Sensing Sensitivity: 2 mV
Zone Setting Status: 755011

## 2023-02-12 ENCOUNTER — Other Ambulatory Visit (HOSPITAL_BASED_OUTPATIENT_CLINIC_OR_DEPARTMENT_OTHER): Payer: Self-pay

## 2023-02-12 ENCOUNTER — Other Ambulatory Visit: Payer: Self-pay

## 2023-02-18 ENCOUNTER — Other Ambulatory Visit: Payer: Self-pay

## 2023-02-18 ENCOUNTER — Encounter: Payer: Self-pay | Admitting: Family Medicine

## 2023-02-18 DIAGNOSIS — Z1283 Encounter for screening for malignant neoplasm of skin: Secondary | ICD-10-CM

## 2023-02-19 NOTE — Progress Notes (Signed)
Remote pacemaker transmission.   

## 2023-02-20 ENCOUNTER — Other Ambulatory Visit: Payer: Self-pay

## 2023-02-28 ENCOUNTER — Other Ambulatory Visit: Payer: Self-pay | Admitting: Student

## 2023-03-04 ENCOUNTER — Encounter: Payer: Self-pay | Admitting: Gastroenterology

## 2023-03-04 ENCOUNTER — Ambulatory Visit (AMBULATORY_SURGERY_CENTER): Payer: Medicare PPO

## 2023-03-04 ENCOUNTER — Other Ambulatory Visit (HOSPITAL_BASED_OUTPATIENT_CLINIC_OR_DEPARTMENT_OTHER): Payer: Self-pay

## 2023-03-04 VITALS — Ht 72.0 in | Wt 152.0 lb

## 2023-03-04 DIAGNOSIS — Z860100 Personal history of colon polyps, unspecified: Secondary | ICD-10-CM

## 2023-03-04 MED ORDER — NA SULFATE-K SULFATE-MG SULF 17.5-3.13-1.6 GM/177ML PO SOLN
1.0000 | Freq: Once | ORAL | 0 refills | Status: AC
  Filled 2023-03-04: qty 354, 1d supply, fill #0

## 2023-03-04 NOTE — Progress Notes (Signed)
No egg or soy allergy known to patient  No issues known to pt with past sedation with any surgeries or procedures Patient denies ever being told they had issues or difficulty with intubation  No FH of Malignant Hyperthermia Pt is not on diet pills Pt is not on  home 02  Pt is not on blood thinners  Pt states weekly issues with constipation d/t GLP Antagonist No A fib or A flutter Have any cardiac testing pending--no Patient's chart reviewed by Cathlyn Parsons CNRA prior to previsit and patient appropriate for the [facility].  Previsit completed and red dot placed by patient's name on their procedure day (on provider's schedule).    Pt instructed to use Singlecare.com or GoodRx for a price reduction on prep  Patient has pacemaker Ambulates independently

## 2023-03-10 ENCOUNTER — Other Ambulatory Visit (HOSPITAL_BASED_OUTPATIENT_CLINIC_OR_DEPARTMENT_OTHER): Payer: Self-pay

## 2023-03-10 ENCOUNTER — Other Ambulatory Visit: Payer: Self-pay

## 2023-03-11 ENCOUNTER — Other Ambulatory Visit (HOSPITAL_BASED_OUTPATIENT_CLINIC_OR_DEPARTMENT_OTHER): Payer: Self-pay

## 2023-03-17 ENCOUNTER — Other Ambulatory Visit (HOSPITAL_BASED_OUTPATIENT_CLINIC_OR_DEPARTMENT_OTHER): Payer: Self-pay

## 2023-03-17 DIAGNOSIS — I10 Essential (primary) hypertension: Secondary | ICD-10-CM | POA: Diagnosis not present

## 2023-03-17 DIAGNOSIS — E785 Hyperlipidemia, unspecified: Secondary | ICD-10-CM | POA: Diagnosis not present

## 2023-03-17 DIAGNOSIS — E1165 Type 2 diabetes mellitus with hyperglycemia: Secondary | ICD-10-CM | POA: Diagnosis not present

## 2023-03-17 MED ORDER — MOUNJARO 5 MG/0.5ML ~~LOC~~ SOAJ
5.0000 mg | SUBCUTANEOUS | 1 refills | Status: DC
  Filled 2023-03-17: qty 2, 28d supply, fill #0
  Filled 2023-04-10: qty 2, 28d supply, fill #1

## 2023-03-17 MED ORDER — CONTOUR NEXT TEST VI STRP
ORAL_STRIP | 5 refills | Status: DC
  Filled 2023-03-17: qty 100, 50d supply, fill #0
  Filled 2023-07-12: qty 100, 50d supply, fill #1
  Filled 2023-08-28: qty 100, 50d supply, fill #2

## 2023-03-25 ENCOUNTER — Ambulatory Visit: Payer: Medicare PPO | Admitting: Gastroenterology

## 2023-03-25 ENCOUNTER — Encounter: Payer: Self-pay | Admitting: Gastroenterology

## 2023-03-25 VITALS — BP 99/56 | HR 84 | Temp 98.2°F | Resp 11

## 2023-03-25 DIAGNOSIS — D123 Benign neoplasm of transverse colon: Secondary | ICD-10-CM | POA: Diagnosis not present

## 2023-03-25 DIAGNOSIS — K644 Residual hemorrhoidal skin tags: Secondary | ICD-10-CM | POA: Diagnosis not present

## 2023-03-25 DIAGNOSIS — D124 Benign neoplasm of descending colon: Secondary | ICD-10-CM

## 2023-03-25 DIAGNOSIS — D122 Benign neoplasm of ascending colon: Secondary | ICD-10-CM | POA: Diagnosis not present

## 2023-03-25 DIAGNOSIS — K641 Second degree hemorrhoids: Secondary | ICD-10-CM | POA: Diagnosis not present

## 2023-03-25 DIAGNOSIS — Z1211 Encounter for screening for malignant neoplasm of colon: Secondary | ICD-10-CM

## 2023-03-25 DIAGNOSIS — Z860100 Personal history of colon polyps, unspecified: Secondary | ICD-10-CM

## 2023-03-25 DIAGNOSIS — I10 Essential (primary) hypertension: Secondary | ICD-10-CM | POA: Diagnosis not present

## 2023-03-25 DIAGNOSIS — E119 Type 2 diabetes mellitus without complications: Secondary | ICD-10-CM | POA: Diagnosis not present

## 2023-03-25 DIAGNOSIS — Z860101 Personal history of adenomatous and serrated colon polyps: Secondary | ICD-10-CM

## 2023-03-25 DIAGNOSIS — E785 Hyperlipidemia, unspecified: Secondary | ICD-10-CM | POA: Diagnosis not present

## 2023-03-25 MED ORDER — SODIUM CHLORIDE 0.9 % IV SOLN: 500.0000 mL | INTRAVENOUS | Status: DC

## 2023-03-25 NOTE — Progress Notes (Signed)
Called to room to assist during endoscopic procedure.  Patient ID and intended procedure confirmed with present staff. Received instructions for my participation in the procedure from the performing physician.  

## 2023-03-25 NOTE — Progress Notes (Signed)
Patient states there have been no changes to medical or surgical history since time of pre-visit. 

## 2023-03-25 NOTE — Op Note (Signed)
[facility] Patient Name: Justin [name] Procedure Date: 03/25/2023 10:10 AM MRN: [id number] Endoscopist: Corliss Parish , MD, [id number] Age: 67 Referring MD:  Date of Birth: 11-23-1955 Gender: Male Account #: [id number] Procedure:                Colonoscopy Indications:              Screening for colorectal malignant neoplasm, High                            risk colon cancer surveillance: Personal history of                            colonic polyps Medicines:                Monitored Anesthesia Care Procedure:                Pre-Anesthesia Assessment:                           - Prior to the procedure, a History and Physical                            was performed, and patient medications and                            allergies were reviewed. The patient's tolerance of                            previous anesthesia was also reviewed. The risks                            and benefits of the procedure and the sedation                            options and risks were discussed with the patient.                            All questions were answered, and informed consent                            was obtained. Prior Anticoagulants: The patient has                            taken no anticoagulant or antiplatelet agents. ASA                            Grade Assessment: III - A patient with severe                            systemic disease. After reviewing the risks and                            benefits, the patient was deemed in satisfactory  condition to undergo the procedure.                           After obtaining informed consent, the colonoscope                            was passed under direct vision. Throughout the                            procedure, the patient's blood pressure, pulse, and                            oxygen saturations were monitored continuously. The                            Olympus Scope [id number] was  introduced through the                            anus and advanced to the the cecum, identified by                            appendiceal orifice and ileocecal valve. The                            colonoscopy was performed without difficulty. The                            patient tolerated the procedure. The quality of the                            bowel preparation was adequate. The ileocecal                            valve, appendiceal orifice, and rectum were                            photographed. Scope In: 10:29:24 AM Scope Out: 10:47:42 AM Scope Withdrawal Time: 0 hours 14 minutes 32 seconds  Total Procedure Duration: 0 hours 18 minutes 18 seconds  Findings:                 The digital rectal exam findings include                            hemorrhoids. Pertinent negatives include no                            palpable rectal lesions.                           A large amount of semi-liquid stool was found in                            the entire colon, interfering with visualization.  Lavage of the area was performed using copious                            amounts, resulting in clearance with adequate                            visualization.                           Three sessile polyps were found in the transverse                            colon (2) and ascending colon (1). The polyps were                            2 to 6 mm in size. These polyps were removed with a                            cold snare. Resection and retrieval were complete.                           Non-bleeding internal hemorrhoids were found during                            retroflexion, during perianal exam and during                            digital exam. The hemorrhoids were Grade II                            (internal hemorrhoids that prolapse but reduce                            spontaneously). Complications:            No immediate complications. Estimated Blood  Loss:     Estimated blood loss was minimal. Impression:               - Hemorrhoids found on digital rectal exam.                           - Stool in the entire examined colon.                           - Three 2 to 6 mm polyps in the transverse colon                            and in the ascending colon, removed with a cold                            snare. Resected and retrieved.                           - Non-bleeding internal hemorrhoids. Recommendation:           -  The patient will be observed post-procedure,                            until all discharge criteria are met.                           - Discharge patient to home.                           - Patient has a contact number available for                            emergencies. The signs and symptoms of potential                            delayed complications were discussed with the                            patient. Return to normal activities tomorrow.                            Written discharge instructions were provided to the                            patient.                           - High fiber diet.                           - Use FiberCon 1-2 tablets PO daily.                           - Continue present medications.                           - Await pathology results.                           - Repeat colonoscopy in 3/5/7 years for                            surveillance based on final pathology. Recommend                            either 2-day preparation or 1 week of                            MiraLAX/Dulcolax prior to preparation.                           - The findings and recommendations were discussed                            with the patient.                           -  The findings and recommendations were discussed                            with the patient's family. Corliss Parish, MD 03/25/2023 10:52:41 AM

## 2023-03-25 NOTE — Patient Instructions (Signed)
Discharge instructions given. Handouts on polyps and Hemorrhoids. See report for Recommendations. Resume previous medications. YOU HAD AN ENDOSCOPIC PROCEDURE TODAY AT THE [facility]:   Refer to the procedure report that was given to you for any specific questions about what was found during the examination.  If the procedure report does not answer your questions, please call your gastroenterologist to clarify.  If you requested that your care partner not be given the details of your procedure findings, then the procedure report has been included in a sealed envelope for you to review at your convenience later.  YOU SHOULD EXPECT: Some feelings of bloating in the abdomen. Passage of more gas than usual.  Walking can help get rid of the air that was put into your GI tract during the procedure and reduce the bloating. If you had a lower endoscopy (such as a colonoscopy or flexible sigmoidoscopy) you may notice spotting of blood in your stool or on the toilet paper. If you underwent a bowel prep for your procedure, you may not have a normal bowel movement for a few days.  Please Note:  You might notice some irritation and congestion in your nose or some drainage.  This is from the oxygen used during your procedure.  There is no need for concern and it should clear up in a day or so.  SYMPTOMS TO REPORT IMMEDIATELY:  Following lower endoscopy (colonoscopy or flexible sigmoidoscopy):  Excessive amounts of blood in the stool  Significant tenderness or worsening of abdominal pains  Swelling of the abdomen that is new, acute  Fever of 100F or higher   For urgent or emergent issues, a gastroenterologist can be reached at any hour by calling [phone]. Do not use MyChart messaging for urgent concerns.    DIET:  We do recommend a small meal at first, but then you may proceed to your regular diet.  Drink plenty of fluids but you should avoid alcoholic beverages for 24  hours.  ACTIVITY:  You should plan to take it easy for the rest of today and you should NOT DRIVE or use heavy machinery until tomorrow (because of the sedation medicines used during the test).    FOLLOW UP: Our staff will call the number listed on your records the next business day following your procedure.  We will call around 7:15- 8:00 am to check on you and address any questions or concerns that you may have regarding the information given to you following your procedure. If we do not reach you, we will leave a message.     If any biopsies were taken you will be contacted by phone or by letter within the next 1-3 weeks.  Please call us at [phone] if you have not heard about the biopsies in 3 weeks.    SIGNATURES/CONFIDENTIALITY: You and/or your care partner have signed paperwork which will be entered into your electronic medical record.  These signatures attest to the fact that that the information above on your After Visit Summary has been reviewed and is understood.  Full responsibility of the confidentiality of this discharge information lies with you and/or your care-partner.

## 2023-03-25 NOTE — Progress Notes (Signed)
Vss nad trans to pacu 

## 2023-03-25 NOTE — Progress Notes (Signed)
GASTROENTEROLOGY PROCEDURE H&P NOTE   Primary Care Physician: Shelva Majestic, MD  HPI: Justin [name] is a 67 y.o. male who presents for screening colonoscopy (had history of adenomas 5-years before 10-year colonoscopy).  Past Medical History:  Diagnosis Date   Acute prostatitis 12/02/2009   Qualifier: Diagnosis of  By: Alphonsus Sias MD, Ronnette Hila    Allergy    AV block    ED (erectile dysfunction)    sildenafil 100 mg sparingly   History of colonic polyps    Hyperlipidemia    Hypertension    S/P placement of cardiac pacemaker    Symptomatic bradycardia    Type 2 diabetes mellitus 2007   Past Surgical History:  Procedure Laterality Date   None     PACEMAKER IMPLANT N/A 08/06/2020   Procedure: PACEMAKER IMPLANT;  Surgeon: Lanier Prude, MD;  Location: [facility]AS[facility];  Service: Cardiovascular;  Laterality: N/A;   Current Outpatient Medications  Medication Sig Dispense Refill   acetaminophen (TYLENOL) 500 MG tablet Take 1,000 mg by mouth every 6 (six) hours as needed for mild pain.     amLODipine (NORVASC) 5 MG tablet TAKE 1 TABLET (5 MG TOTAL) BY MOUTH DAILY. 90 tablet 2   atorvastatin (LIPITOR) 20 MG tablet TAKE 1 TABLET BY MOUTH EVERY DAY 90 tablet 3   benazepril (LOTENSIN) 20 MG tablet Take 0.5 tablets (10 mg total) by mouth 2 (two) times daily. 180 tablet 3   blood glucose meter kit and supplies KIT Dispense based on patient and insurance preference. Use up to four times daily as directed. (Patient not taking: Reported on 03/04/2023) 1 each 3   cetirizine (ZYRTEC) 10 MG tablet Take 10 mg by mouth daily as needed for allergies. (Patient not taking: Reported on 03/04/2023)     Dulaglutide (TRULICITY) 4.5 MG/0.5ML SOAJ Inject 4.5 mg as directed once a week. 2 mL 5   glucose blood (CONTOUR NEXT TEST) test strip Use to check blood glucose twice daily 100 strip 5   glucose blood (ONETOUCH VERIO) test strip Use as instructed (Patient not taking: Reported on 03/04/2023)  100 each 12   Multiple Vitamin (MULTIVITAMIN) capsule Take 1 capsule by mouth daily.     ONETOUCH DELICA LANCETS FINE MISC 1 each by Does not apply route 2 (two) times daily. (Patient not taking: Reported on 03/04/2023) 100 each 3   SYNJARDY XR 12.08-998 MG TB24 TAKE 1 TABLET BY MOUTH TWICE A DAY 180 tablet 3   tadalafil (CIALIS) 20 MG tablet TAKE 1 TABLET (20 MG TOTAL) BY MOUTH EVERY OTHER DAY AS NEEDED FOR ERECTILE DYSFUNCTION. 6 tablet 19   tirzepatide (MOUNJARO) 5 MG/0.5ML Pen Inject 5 mg into the skin once a week. 2 mL 1   urea (CARMOL) 10 % cream Apply topically as needed. 85 g 0   Current Facility-Administered Medications  Medication Dose Route Frequency Provider Last Rate Last Admin   0.9 %  sodium chloride infusion  500 mL Intravenous Continuous Mansouraty, Netty Starring., MD        Current Outpatient Medications:    acetaminophen (TYLENOL) 500 MG tablet, Take 1,000 mg by mouth every 6 (six) hours as needed for mild pain., Disp: , Rfl:    amLODipine (NORVASC) 5 MG tablet, TAKE 1 TABLET (5 MG TOTAL) BY MOUTH DAILY., Disp: 90 tablet, Rfl: 2   atorvastatin (LIPITOR) 20 MG tablet, TAKE 1 TABLET BY MOUTH EVERY DAY, Disp: 90 tablet, Rfl: 3   benazepril (LOTENSIN) 20 MG tablet,  Take 0.5 tablets (10 mg total) by mouth 2 (two) times daily., Disp: 180 tablet, Rfl: 3   blood glucose meter kit and supplies KIT, Dispense based on patient and insurance preference. Use up to four times daily as directed. (Patient not taking: Reported on 03/04/2023), Disp: 1 each, Rfl: 3   cetirizine (ZYRTEC) 10 MG tablet, Take 10 mg by mouth daily as needed for allergies. (Patient not taking: Reported on 03/04/2023), Disp: , Rfl:    Dulaglutide (TRULICITY) 4.5 MG/0.5ML SOAJ, Inject 4.5 mg as directed once a week., Disp: 2 mL, Rfl: 5   glucose blood (CONTOUR NEXT TEST) test strip, Use to check blood glucose twice daily, Disp: 100 strip, Rfl: 5   glucose blood (ONETOUCH VERIO) test strip, Use as instructed (Patient not  taking: Reported on 03/04/2023), Disp: 100 each, Rfl: 12   Multiple Vitamin (MULTIVITAMIN) capsule, Take 1 capsule by mouth daily., Disp: , Rfl:    ONETOUCH DELICA LANCETS FINE MISC, 1 each by Does not apply route 2 (two) times daily. (Patient not taking: Reported on 03/04/2023), Disp: 100 each, Rfl: 3   SYNJARDY XR 12.08-998 MG TB24, TAKE 1 TABLET BY MOUTH TWICE A DAY, Disp: 180 tablet, Rfl: 3   tadalafil (CIALIS) 20 MG tablet, TAKE 1 TABLET (20 MG TOTAL) BY MOUTH EVERY OTHER DAY AS NEEDED FOR ERECTILE DYSFUNCTION., Disp: 6 tablet, Rfl: 19   tirzepatide (MOUNJARO) 5 MG/0.5ML Pen, Inject 5 mg into the skin once a week., Disp: 2 mL, Rfl: 1   urea (CARMOL) 10 % cream, Apply topically as needed., Disp: 85 g, Rfl: 0  Current Facility-Administered Medications:    0.9 %  sodium chloride infusion, 500 mL, Intravenous, Continuous, Mansouraty, Netty Starring., MD No Known Allergies Family History  Problem Relation Age of Onset   Hypertension Mother    Stroke Mother        69, first stroke 46. never smoker   Diabetes Mother        in late life   Dementia Father    CAD Father        CABG mid 6s, never smoker   Hypertension Sister    Diabetes Brother    Hypertension Brother    Prostate cancer Brother        31. died at 33   Pneumonia Maternal Grandfather        died from this   Diabetes Paternal Grandfather    Cerebral palsy Daughter    Cerebral palsy Daughter    Colon cancer Neg Hx    Esophageal cancer Neg Hx    Rectal cancer Neg Hx    Stomach cancer Neg Hx    Social History   Socioeconomic History   Marital status: Married    Spouse name: Not on file   Number of children: Not on file   Years of education: Not on file   Highest education level: Bachelor's degree (e.g., BA, AB, BS)  Occupational History   Occupation: Retired  Tobacco Use   Smoking status: Never   Smokeless tobacco: Never  Vaping Use   Vaping status: Never Used  Substance and Sexual Activity   Alcohol use: Yes     Alcohol/week: 7.0 standard drinks of alcohol    Types: 5 Glasses of wine, 2 Cans of beer per week    Comment: socially   Drug use: No   Sexual activity: Yes    Comment: wife only  Other Topics Concern   Not on file  Social History Narrative  Married. Identical twin daughters 14 in 2019- mirror twins- born 51 weeks. Both daughters have CP and are wheelchair bound - both live in [place] - graduated top of their classes and have MBA from [employer]. 1 daughters married- they all live together.       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       Hobbies: travel, walks and hikes, skiing       Social Determinants of Health   Financial Resource Strain: Low Risk  (09/11/2022)   Overall Financial Resource Strain (CARDIA)    Difficulty of Paying Living Expenses: Not hard at all  Food Insecurity: No Food Insecurity (09/11/2022)   Hunger Vital Sign    Worried About Running Out of Food in the Last Year: Never true    Ran Out of Food in the Last Year: Never true  Transportation Needs: No Transportation Needs (09/11/2022)   PRAPARE - Administrator, Civil Service (Medical): No    Lack of Transportation (Non-Medical): No  Physical Activity: Sufficiently Active (09/11/2022)   Exercise Vital Sign    Days of Exercise per Week: 5 days    Minutes of Exercise per Session: 30 min  Stress: No Stress Concern Present (09/11/2022)   [employer] of Occupational Health - Occupational Stress Questionnaire    Feeling of Stress : Not at all  Social Connections: Socially Isolated (09/11/2022)   Social Connection and Isolation Panel [NHANES]    Frequency of Communication with Friends and Family: Once a week    Frequency of Social Gatherings with Friends and Family: Once a week    Attends Religious Services: Never    Database administrator or Organizations: No    Attends Banker Meetings: Never    Marital Status: Married  Catering manager Violence: Not At Risk (07/04/2022)    Humiliation, Afraid, Rape, and Kick questionnaire    Fear of Current or Ex-Partner: No    Emotionally Abused: No    Physically Abused: No    Sexually Abused: No    Physical Exam: Today's Vitals   03/25/23 0933  Temp: 98.2 F (36.8 C)   There is no height or weight on file to calculate BMI. GEN: NAD EYE: Sclerae anicteric ENT: MMM CV: Non-tachycardic GI: Soft, NT/ND NEURO:  Alert & Oriented x 3  Lab Results: No results for input(s): "WBC", "HGB", "HCT", "PLT" in the last 72 hours. BMET No results for input(s): "NA", "K", "CL", "CO2", "GLUCOSE", "BUN", "CREATININE", "CALCIUM" in the last 72 hours. LFT No results for input(s): "PROT", "ALBUMIN", "AST", "ALT", "ALKPHOS", "BILITOT", "BILIDIR", "IBILI" in the last 72 hours. PT/INR No results for input(s): "LABPROT", "INR" in the last 72 hours.   Impression / Plan: This is a 67 y.o.male who presents for screening colonoscopy (had history of adenomas 5-years before 10-year colonoscopy).  The risks and benefits of endoscopic evaluation/treatment were discussed with the patient and/or family; these include but are not limited to the risk of perforation, infection, bleeding, missed lesions, lack of diagnosis, severe illness requiring hospitalization, as well as anesthesia and sedation related illnesses.  The patient's history has been reviewed, patient examined, no change in status, and deemed stable for procedure.  The patient and/or family is agreeable to proceed.    Corliss Parish, MD [facility] Advanced Endoscopy Office # [phone]

## 2023-03-26 ENCOUNTER — Telehealth: Payer: Self-pay

## 2023-03-26 NOTE — Telephone Encounter (Signed)
Left message on answering machine. 

## 2023-03-27 ENCOUNTER — Encounter: Payer: Self-pay | Admitting: Gastroenterology

## 2023-03-27 LAB — SURGICAL PATHOLOGY

## 2023-04-03 ENCOUNTER — Other Ambulatory Visit: Payer: Self-pay | Admitting: Family Medicine

## 2023-04-10 ENCOUNTER — Other Ambulatory Visit: Payer: Self-pay

## 2023-04-25 ENCOUNTER — Ambulatory Visit: Payer: Medicare PPO | Admitting: Podiatry

## 2023-04-25 ENCOUNTER — Ambulatory Visit: Payer: Medicare PPO | Admitting: Family Medicine

## 2023-04-29 ENCOUNTER — Other Ambulatory Visit (HOSPITAL_BASED_OUTPATIENT_CLINIC_OR_DEPARTMENT_OTHER): Payer: Self-pay

## 2023-04-29 ENCOUNTER — Ambulatory Visit: Payer: Medicare PPO | Admitting: Podiatry

## 2023-04-29 ENCOUNTER — Other Ambulatory Visit: Payer: Self-pay | Admitting: Family Medicine

## 2023-04-29 DIAGNOSIS — E119 Type 2 diabetes mellitus without complications: Secondary | ICD-10-CM

## 2023-04-29 DIAGNOSIS — L84 Corns and callosities: Secondary | ICD-10-CM

## 2023-04-29 NOTE — Progress Notes (Signed)
  Subjective:  Patient ID: Justin [name], male    DOB: December 22, 1955,   MRN: [id number]  No chief complaint on file.   68 y.o. male presents for concern of thickened and painful calluses. Requesting to have them trimmed today. Relates burning and tingling in their feet. Patient is diabetic and last A1c was  Lab Results  Component Value Date   HGBA1C 7.9 (H) 12/24/2022   .   PCP:  Katrinka Garnette KIDD, MD    . Denies any other pedal complaints. Denies n/v/f/c.   Past Medical History:  Diagnosis Date   Acute prostatitis 12/02/2009   Qualifier: Diagnosis of  By: Jimmy MD, Charlie Scarlet    Allergy    AV block    ED (erectile dysfunction)    sildenafil  100 mg sparingly   History of colonic polyps    Hyperlipidemia    Hypertension    S/P placement of cardiac pacemaker    Symptomatic bradycardia    Type 2 diabetes mellitus 2007    Objective:  Physical Exam: Vascular: DP/PT pulses 2/4 bilateral. CFT <3 seconds. Absent hair growth on digits. Edema noted to bilateral lower extremities. Xerosis noted bilaterally.  Skin. No lacerations or abrasions bilateral feet. Nails 1-5 bilateral  are normal in appearance. There is hyperkeratosis noted  sub first and fifth metatarsal bilateral and lateral fourth digit on the right.  Musculoskeletal: MMT 5/5 bilateral lower extremities in DF, PF, Inversion and Eversion. Deceased ROM in DF of ankle joint.  Neurological: Sensation intact to light touch. Protective sensation diminished bilateral.     Assessment:   1. Pre-ulcerative calluses   2. Diabetes mellitus without complication (HCC)      Plan:  Patient was evaluated and treated and all questions answered. -Discussed and educated patient on diabetic foot care, especially with  regards to the vascular, neurological and musculoskeletal systems.  -Stressed the importance of good glycemic control and the detriment of not  controlling glucose levels in relation to the foot. -Discussed supportive  shoes at all times and checking feet regularly.  -Mechanically debrided hyperkeratotic tissue x 5 without incident with chisel.  -Answered all patient questions -Patient to return  in 3 months for at risk foot care -Patient advised to call the office if any problems or questions arise in the meantime.   Asberry Failing, DPM

## 2023-05-05 ENCOUNTER — Ambulatory Visit (INDEPENDENT_AMBULATORY_CARE_PROVIDER_SITE_OTHER): Payer: Medicare PPO

## 2023-05-05 ENCOUNTER — Other Ambulatory Visit: Payer: Self-pay

## 2023-05-05 ENCOUNTER — Encounter: Payer: Self-pay | Admitting: Cardiology

## 2023-05-05 DIAGNOSIS — I441 Atrioventricular block, second degree: Secondary | ICD-10-CM

## 2023-05-05 LAB — CUP PACEART REMOTE DEVICE CHECK
Battery Remaining Longevity: 119 mo
Battery Voltage: 3 V
Brady Statistic AP VP Percent: 0.01 %
Brady Statistic AP VS Percent: 0 %
Brady Statistic AS VP Percent: 99.97 %
Brady Statistic AS VS Percent: 0.02 %
Brady Statistic RA Percent Paced: 0.01 %
Brady Statistic RV Percent Paced: 99.98 %
Date Time Interrogation Session: 20250119203853
Implantable Lead Connection Status: 753985
Implantable Lead Connection Status: 753985
Implantable Lead Implant Date: 20220424
Implantable Lead Implant Date: 20220424
Implantable Lead Location: 753859
Implantable Lead Location: 753860
Implantable Lead Model: 3830
Implantable Lead Model: 5076
Implantable Pulse Generator Implant Date: 20220424
Lead Channel Impedance Value: 304 Ohm
Lead Channel Impedance Value: 342 Ohm
Lead Channel Impedance Value: 361 Ohm
Lead Channel Impedance Value: 513 Ohm
Lead Channel Pacing Threshold Amplitude: 0.75 V
Lead Channel Pacing Threshold Amplitude: 0.875 V
Lead Channel Pacing Threshold Pulse Width: 0.4 ms
Lead Channel Pacing Threshold Pulse Width: 0.4 ms
Lead Channel Sensing Intrinsic Amplitude: 3 mV
Lead Channel Sensing Intrinsic Amplitude: 3 mV
Lead Channel Sensing Intrinsic Amplitude: 4.875 mV
Lead Channel Sensing Intrinsic Amplitude: 4.875 mV
Lead Channel Setting Pacing Amplitude: 1.75 V
Lead Channel Setting Pacing Amplitude: 2 V
Lead Channel Setting Pacing Pulse Width: 0.4 ms
Lead Channel Setting Sensing Sensitivity: 2 mV
Zone Setting Status: 755011

## 2023-05-05 MED ORDER — AMLODIPINE BESYLATE 5 MG PO TABS: 5.0000 mg | ORAL_TABLET | Freq: Every day | ORAL | 2 refills | Status: DC

## 2023-05-10 ENCOUNTER — Other Ambulatory Visit: Payer: Self-pay | Admitting: Family Medicine

## 2023-05-22 ENCOUNTER — Other Ambulatory Visit (HOSPITAL_BASED_OUTPATIENT_CLINIC_OR_DEPARTMENT_OTHER): Payer: Self-pay

## 2023-05-22 MED ORDER — MOUNJARO 5 MG/0.5ML ~~LOC~~ SOAJ
5.0000 mg | SUBCUTANEOUS | 3 refills | Status: DC
  Filled 2023-05-22: qty 2, 28d supply, fill #0
  Filled 2023-06-12: qty 2, 28d supply, fill #1

## 2023-05-26 ENCOUNTER — Other Ambulatory Visit (HOSPITAL_BASED_OUTPATIENT_CLINIC_OR_DEPARTMENT_OTHER): Payer: Self-pay

## 2023-05-26 DIAGNOSIS — D485 Neoplasm of uncertain behavior of skin: Secondary | ICD-10-CM | POA: Diagnosis not present

## 2023-05-26 MED ORDER — CLOBETASOL PROPIONATE 0.05 % EX CREA
1.0000 | TOPICAL_CREAM | Freq: Two times a day (BID) | CUTANEOUS | 0 refills | Status: DC
  Filled 2023-05-26: qty 30, 30d supply, fill #0

## 2023-06-10 DIAGNOSIS — H5213 Myopia, bilateral: Secondary | ICD-10-CM | POA: Diagnosis not present

## 2023-06-10 DIAGNOSIS — H524 Presbyopia: Secondary | ICD-10-CM | POA: Diagnosis not present

## 2023-06-10 DIAGNOSIS — E119 Type 2 diabetes mellitus without complications: Secondary | ICD-10-CM | POA: Diagnosis not present

## 2023-06-10 DIAGNOSIS — B880 Other acariasis: Secondary | ICD-10-CM | POA: Diagnosis not present

## 2023-06-10 DIAGNOSIS — H52223 Regular astigmatism, bilateral: Secondary | ICD-10-CM | POA: Diagnosis not present

## 2023-06-10 LAB — HM DIABETES EYE EXAM

## 2023-06-12 NOTE — Progress Notes (Signed)
 Remote pacemaker transmission.

## 2023-06-16 DIAGNOSIS — E1165 Type 2 diabetes mellitus with hyperglycemia: Secondary | ICD-10-CM | POA: Diagnosis not present

## 2023-06-23 ENCOUNTER — Other Ambulatory Visit (HOSPITAL_BASED_OUTPATIENT_CLINIC_OR_DEPARTMENT_OTHER): Payer: Self-pay

## 2023-06-23 DIAGNOSIS — E785 Hyperlipidemia, unspecified: Secondary | ICD-10-CM | POA: Diagnosis not present

## 2023-06-23 DIAGNOSIS — I10 Essential (primary) hypertension: Secondary | ICD-10-CM | POA: Diagnosis not present

## 2023-06-23 DIAGNOSIS — E1165 Type 2 diabetes mellitus with hyperglycemia: Secondary | ICD-10-CM | POA: Diagnosis not present

## 2023-06-23 MED ORDER — SYNJARDY 12.5-1000 MG PO TABS
1.0000 | ORAL_TABLET | Freq: Two times a day (BID) | ORAL | 5 refills | Status: DC
  Filled 2023-06-23: qty 60, 30d supply, fill #0
  Filled 2023-07-12 – 2023-07-16 (×2): qty 60, 30d supply, fill #1
  Filled 2023-08-18: qty 60, 30d supply, fill #2
  Filled 2023-09-16: qty 60, 30d supply, fill #3
  Filled 2023-10-15: qty 60, 30d supply, fill #4
  Filled 2024-01-05: qty 60, 30d supply, fill #5

## 2023-06-23 MED ORDER — MOUNJARO 7.5 MG/0.5ML ~~LOC~~ SOAJ
7.5000 mg | SUBCUTANEOUS | 5 refills | Status: DC
  Filled 2023-06-23: qty 2, 28d supply, fill #0
  Filled 2023-07-12 – 2023-07-14 (×2): qty 2, 28d supply, fill #1
  Filled 2023-08-14: qty 2, 28d supply, fill #2
  Filled 2023-09-16: qty 2, 28d supply, fill #3
  Filled 2023-10-09: qty 2, 28d supply, fill #4

## 2023-07-12 ENCOUNTER — Other Ambulatory Visit (HOSPITAL_BASED_OUTPATIENT_CLINIC_OR_DEPARTMENT_OTHER): Payer: Self-pay

## 2023-07-14 ENCOUNTER — Other Ambulatory Visit (HOSPITAL_BASED_OUTPATIENT_CLINIC_OR_DEPARTMENT_OTHER): Payer: Self-pay

## 2023-07-14 ENCOUNTER — Other Ambulatory Visit: Payer: Self-pay

## 2023-07-16 ENCOUNTER — Other Ambulatory Visit: Payer: Self-pay

## 2023-07-22 DIAGNOSIS — H01001 Unspecified blepharitis right upper eyelid: Secondary | ICD-10-CM | POA: Diagnosis not present

## 2023-07-22 DIAGNOSIS — H01004 Unspecified blepharitis left upper eyelid: Secondary | ICD-10-CM | POA: Diagnosis not present

## 2023-07-22 DIAGNOSIS — B880 Other acariasis: Secondary | ICD-10-CM | POA: Diagnosis not present

## 2023-07-23 ENCOUNTER — Encounter: Payer: Medicare PPO | Admitting: Physician Assistant

## 2023-07-23 NOTE — Progress Notes (Unsigned)
  Cardiology Office Note:  .   Date:  07/23/2023  ID:  Justin [name], DOB 01-Apr-1956, MRN [id number] PCP: Shelva Majestic, MD  [facility] Providers Cardiologist:  Little Ishikawa, MD Electrophysiologist:  Lanier Prude, MD {  History of Present Illness: .   Justin [name] is a 68 y.o. male w/PMHx of  HTN, HLD, DM Advanced heart block w/PPM  Saw Dr. Lalla Brothers 07/23/22, echo prior year done given 100% RV pacing noted preserved LVEF Device functioning normally No changes were made with recs to f/u BP with his PMD  Saw cards team APP on 11/18/22, infrequent dizziness with standing or working out in the heat Looking forward to a trip to [place]   Today's visit is scheduled as an annual visit ROS:   He is doing very well Walks most days for exercise, more regularly as the weather improves Reports good exertional capacity No CP, palpitations No SOB, DOE No near syncope or syncope  PMD does labs   Device information MDT dual chamber PPM implanted 08/06/2020 3830 in the RV appears septal/bundle area   Studies Reviewed: Marland Kitchen    EKG not done today  DEVICE interrogation done today and reviewed by myself Battery and lead measurements are good On labeled AT/AF episode No EGMs, markers only, unclear, suggests AFf > AT/AFlutter 46 sec   07/17/2022 echo  EF 50 RV normal Trivial MR   Risk Assessment/Calculations:    Physical Exam:   VS:  There were no vitals taken for this visit.   Wt Readings from Last 3 Encounters:  03/04/23 152 lb (68.9 kg)  12/24/22 155 lb 12.8 oz (70.7 kg)  11/18/22 158 lb (71.7 kg)    GEN: Well nourished, well developed in no acute distress NECK: No JVD; No carotid bruits CARDIAC: RRR, no murmurs, rubs, gallops RESPIRATORY:  CTA b/l without rales, wheezing or rhonchi  ABDOMEN: Soft, non-tender, non-distended EXTREMITIES: No edema; No deformity   PPM site: is stable, no thinning, fluctuation, tethering  ASSESSMENT AND  PLAN: .    PPM Intact function No programming changes made AP <0.1% Dependent in the V Base rate 40  HTN A recheck by myself is 126/64     Dispo: remotes as usual, back in clinic in a year, sooner if needed  Signed, Sheilah Pigeon, PA-C

## 2023-07-24 ENCOUNTER — Encounter: Payer: Self-pay | Admitting: Physician Assistant

## 2023-07-24 ENCOUNTER — Ambulatory Visit: Attending: Physician Assistant | Admitting: Physician Assistant

## 2023-07-24 VITALS — BP 120/64 | HR 81 | Resp 16 | Ht 72.0 in | Wt 159.4 lb

## 2023-07-24 DIAGNOSIS — Z950 Presence of cardiac pacemaker: Secondary | ICD-10-CM | POA: Diagnosis not present

## 2023-07-24 DIAGNOSIS — I10 Essential (primary) hypertension: Secondary | ICD-10-CM

## 2023-07-24 LAB — CUP PACEART INCLINIC DEVICE CHECK
Battery Remaining Longevity: 118 mo
Battery Voltage: 3 V
Brady Statistic AP VP Percent: 0.01 %
Brady Statistic AP VS Percent: 0 %
Brady Statistic AS VP Percent: 99.97 %
Brady Statistic AS VS Percent: 0.03 %
Brady Statistic RA Percent Paced: 0.01 %
Brady Statistic RV Percent Paced: 99.97 %
Date Time Interrogation Session: 20250410175600
Implantable Lead Connection Status: 753985
Implantable Lead Connection Status: 753985
Implantable Lead Implant Date: 20220424
Implantable Lead Implant Date: 20220424
Implantable Lead Location: 753859
Implantable Lead Location: 753860
Implantable Lead Model: 3830
Implantable Lead Model: 5076
Implantable Pulse Generator Implant Date: 20220424
Lead Channel Impedance Value: 361 Ohm
Lead Channel Impedance Value: 380 Ohm
Lead Channel Impedance Value: 418 Ohm
Lead Channel Impedance Value: 551 Ohm
Lead Channel Pacing Threshold Amplitude: 0.875 V
Lead Channel Pacing Threshold Amplitude: 1 V
Lead Channel Pacing Threshold Pulse Width: 0.4 ms
Lead Channel Pacing Threshold Pulse Width: 0.4 ms
Lead Channel Sensing Intrinsic Amplitude: 3.125 mV
Lead Channel Sensing Intrinsic Amplitude: 3.75 mV
Lead Channel Sensing Intrinsic Amplitude: 4.875 mV
Lead Channel Sensing Intrinsic Amplitude: 4.875 mV
Lead Channel Setting Pacing Amplitude: 1.75 V
Lead Channel Setting Pacing Amplitude: 2 V
Lead Channel Setting Pacing Pulse Width: 0.4 ms
Lead Channel Setting Sensing Sensitivity: 2 mV
Zone Setting Status: 755011

## 2023-07-24 NOTE — Patient Instructions (Addendum)
 Medication Instructions:   Your physician recommends that you continue on your current medications as directed. Please refer to the Current Medication list given to you today.   *If you need a refill on your cardiac medications before your next appointment, please call your pharmacy*    Lab Work: NONE ORDERED  TODAY     If you have labs (blood work) drawn today and your tests are completely normal, you will receive your results only by: MyChart Message (if you have MyChart) OR A paper copy in the mail If you have any lab test that is abnormal or we need to change your treatment, we will call you to review the results.   Testing/Procedures: NONE ORDERED  TODAY      Follow-Up: At [facility], you and your health needs are our priority.  As part of our continuing mission to provide you with exceptional heart care, our providers are all part of one team.  This team includes your primary Cardiologist (physician) and Advanced Practice Providers or APPs (Physician Assistants and Nurse Practitioners) who all work together to provide you with the care you need, when you need it.  Your next appointment:    1 year(s)  Provider:   You may see Lanier Prude, MD or one of the following Advanced Practice Providers on your designated Care Team:   Francis Dowse, [place]  We recommend signing up for the patient portal called "MyChart".  Sign up information is provided on this After Visit Summary.  MyChart is used to connect with patients for Virtual Visits (Telemedicine).  Patients are able to view lab/test results, encounter notes, upcoming appointments, etc.  Non-urgent messages can be sent to your provider as well.   To learn more about what you can do with MyChart, go to [email or web address].   Other Instructions       1st Floor: - Lobby - Registration  - Pharmacy  - Lab - Cafe  2nd Floor: - PV Lab - Diagnostic Testing (echo, CT, nuclear med)  3rd Floor: -  Vacant  4th Floor: - TCTS (cardiothoracic surgery) - AFib Clinic - Structural Heart Clinic - Vascular Surgery  - Vascular Ultrasound  5th Floor: - HeartCare Cardiology (general and EP) - Clinical Pharmacy for coumadin, hypertension, lipid, weight-loss medications, and med management appointments    Valet parking services will be available as well.

## 2023-07-26 ENCOUNTER — Encounter: Payer: Self-pay | Admitting: Cardiology

## 2023-07-28 ENCOUNTER — Encounter: Payer: Self-pay | Admitting: Podiatry

## 2023-07-28 ENCOUNTER — Ambulatory Visit: Payer: Medicare PPO | Admitting: Podiatry

## 2023-07-28 DIAGNOSIS — E119 Type 2 diabetes mellitus without complications: Secondary | ICD-10-CM

## 2023-07-28 DIAGNOSIS — L84 Corns and callosities: Secondary | ICD-10-CM

## 2023-07-28 NOTE — Progress Notes (Signed)
  Subjective:  Patient ID: Justin [name], male    DOB: July 29, 1955,   MRN: [id number]  No chief complaint on file.   68 y.o. male presents for concern of thickened and painful calluses. Requesting to have them trimmed today. Relates burning and tingling in their feet. Patient is diabetic and last A1c was  Lab Results  Component Value Date   HGBA1C 7.9 (H) 12/24/2022   .   PCP:  Almira Jaeger, MD    . Denies any other pedal complaints. Denies n/v/f/c.   Past Medical History:  Diagnosis Date   Acute prostatitis 12/02/2009   Qualifier: Diagnosis of  By: Joelle Musca MD, Oddis Bench    Allergy    AV block    ED (erectile dysfunction)    sildenafil 100 mg sparingly   History of colonic polyps    Hyperlipidemia    Hypertension    S/P placement of cardiac pacemaker    Symptomatic bradycardia    Type 2 diabetes mellitus 2007    Objective:  Physical Exam: Vascular: DP/PT pulses 2/4 bilateral. CFT <3 seconds. Absent hair growth on digits. Edema noted to bilateral lower extremities. Xerosis noted bilaterally.  Skin. No lacerations or abrasions bilateral feet. Nails 1-5 bilateral  are normal in appearance. There is hyperkeratosis noted  sub first and fifth metatarsal bilateral and lateral fourth digit on the right.  Musculoskeletal: MMT 5/5 bilateral lower extremities in DF, PF, Inversion and Eversion. Deceased ROM in DF of ankle joint.  Neurological: Sensation intact to light touch. Protective sensation diminished bilateral.     Assessment:   1. Pre-ulcerative calluses   2. Diabetes mellitus without complication (HCC)       Plan:  Patient was evaluated and treated and all questions answered. -Discussed and educated patient on diabetic foot care, especially with  regards to the vascular, neurological and musculoskeletal systems.  -Stressed the importance of good glycemic control and the detriment of not  controlling glucose levels in relation to the foot. -Discussed  supportive shoes at all times and checking feet regularly.  -Mechanically debrided hyperkeratotic tissue x 5 without incident with chisel.  -Answered all patient questions -Patient to return  in 3 months for at risk foot care -Patient advised to call the office if any problems or questions arise in the meantime.   Jennefer Moats, DPM

## 2023-08-04 ENCOUNTER — Ambulatory Visit (INDEPENDENT_AMBULATORY_CARE_PROVIDER_SITE_OTHER): Payer: Medicare PPO

## 2023-08-04 DIAGNOSIS — I441 Atrioventricular block, second degree: Secondary | ICD-10-CM

## 2023-08-05 LAB — CUP PACEART REMOTE DEVICE CHECK
Battery Remaining Longevity: 116 mo
Battery Voltage: 3 V
Brady Statistic AP VP Percent: 0.01 %
Brady Statistic AP VS Percent: 0 %
Brady Statistic AS VP Percent: 99.98 %
Brady Statistic AS VS Percent: 0.02 %
Brady Statistic RA Percent Paced: 0.01 %
Brady Statistic RV Percent Paced: 99.98 %
Date Time Interrogation Session: 20250420215058
Implantable Lead Connection Status: 753985
Implantable Lead Connection Status: 753985
Implantable Lead Implant Date: 20220424
Implantable Lead Implant Date: 20220424
Implantable Lead Location: 753859
Implantable Lead Location: 753860
Implantable Lead Model: 3830
Implantable Lead Model: 5076
Implantable Pulse Generator Implant Date: 20220424
Lead Channel Impedance Value: 304 Ohm
Lead Channel Impedance Value: 361 Ohm
Lead Channel Impedance Value: 361 Ohm
Lead Channel Impedance Value: 513 Ohm
Lead Channel Pacing Threshold Amplitude: 0.75 V
Lead Channel Pacing Threshold Amplitude: 1 V
Lead Channel Pacing Threshold Pulse Width: 0.4 ms
Lead Channel Pacing Threshold Pulse Width: 0.4 ms
Lead Channel Sensing Intrinsic Amplitude: 2.625 mV
Lead Channel Sensing Intrinsic Amplitude: 2.625 mV
Lead Channel Sensing Intrinsic Amplitude: 4.875 mV
Lead Channel Sensing Intrinsic Amplitude: 4.875 mV
Lead Channel Setting Pacing Amplitude: 1.75 V
Lead Channel Setting Pacing Amplitude: 2 V
Lead Channel Setting Pacing Pulse Width: 0.4 ms
Lead Channel Setting Sensing Sensitivity: 2 mV
Zone Setting Status: 755011

## 2023-08-10 ENCOUNTER — Encounter: Payer: Self-pay | Admitting: Cardiology

## 2023-08-18 ENCOUNTER — Other Ambulatory Visit (HOSPITAL_BASED_OUTPATIENT_CLINIC_OR_DEPARTMENT_OTHER): Payer: Self-pay

## 2023-08-18 ENCOUNTER — Other Ambulatory Visit: Payer: Self-pay

## 2023-08-19 ENCOUNTER — Other Ambulatory Visit (HOSPITAL_BASED_OUTPATIENT_CLINIC_OR_DEPARTMENT_OTHER): Payer: Self-pay

## 2023-09-01 ENCOUNTER — Other Ambulatory Visit: Payer: Self-pay | Admitting: Family Medicine

## 2023-09-01 ENCOUNTER — Other Ambulatory Visit (HOSPITAL_BASED_OUTPATIENT_CLINIC_OR_DEPARTMENT_OTHER): Payer: Self-pay

## 2023-09-01 MED ORDER — ACCU-CHEK GUIDE TEST VI STRP
ORAL_STRIP | 12 refills | Status: AC
  Filled 2023-09-01: qty 100, 50d supply, fill #0
  Filled 2024-04-22: qty 100, 50d supply, fill #1

## 2023-09-02 ENCOUNTER — Other Ambulatory Visit (HOSPITAL_BASED_OUTPATIENT_CLINIC_OR_DEPARTMENT_OTHER): Payer: Self-pay

## 2023-09-22 NOTE — Addendum Note (Signed)
 Addended by: Edra Govern D on: 09/22/2023 11:58 AM   Modules accepted: Orders

## 2023-09-22 NOTE — Progress Notes (Signed)
 Remote pacemaker transmission.

## 2023-10-23 DIAGNOSIS — E1165 Type 2 diabetes mellitus with hyperglycemia: Secondary | ICD-10-CM | POA: Diagnosis not present

## 2023-10-23 LAB — HEMOGLOBIN A1C: Hemoglobin A1C: 7.1

## 2023-10-27 ENCOUNTER — Ambulatory Visit: Admitting: Podiatry

## 2023-10-27 ENCOUNTER — Encounter: Payer: Self-pay | Admitting: Podiatry

## 2023-10-27 DIAGNOSIS — L84 Corns and callosities: Secondary | ICD-10-CM | POA: Diagnosis not present

## 2023-10-27 DIAGNOSIS — E119 Type 2 diabetes mellitus without complications: Secondary | ICD-10-CM

## 2023-10-27 NOTE — Progress Notes (Signed)
  Subjective:  Patient ID: Justin [name], male    DOB: 14-Jul-1955,   MRN: [id number]  Chief Complaint  Patient presents with   Diabetes    Look at my calluses and I have a corn on my right little toe.  Harlene Russel - 06/23/2023; A1c - 7.1    68 y.o. male presents for concern of thickened and painful calluses. Requesting to have them trimmed today. Relates burning and tingling in their feet. Patient is diabetic and last A1c was  Lab Results  Component Value Date   HGBA1C 7.9 (H) 12/24/2022   .   PCP:  Katrinka Garnette KIDD, MD    . Denies any other pedal complaints. Denies n/v/f/c.   Past Medical History:  Diagnosis Date   Acute prostatitis 12/02/2009   Qualifier: Diagnosis of  By: Jimmy MD, Charlie Scarlet    Allergy    AV block    ED (erectile dysfunction)    sildenafil  100 mg sparingly   History of colonic polyps    Hyperlipidemia    Hypertension    S/P placement of cardiac pacemaker    Symptomatic bradycardia    Type 2 diabetes mellitus 2007    Objective:  Physical Exam: Vascular: DP/PT pulses 2/4 bilateral. CFT <3 seconds. Absent hair growth on digits. Edema noted to bilateral lower extremities. Xerosis noted bilaterally.  Skin. No lacerations or abrasions bilateral feet. Nails 1-5 bilateral  are normal in appearance. There is hyperkeratosis noted  sub first and fifth metatarsal bilateral and lateral fourth digit on the right.  Musculoskeletal: MMT 5/5 bilateral lower extremities in DF, PF, Inversion and Eversion. Deceased ROM in DF of ankle joint.  Neurological: Sensation intact to light touch. Protective sensation diminished bilateral.     Assessment:   1. Pre-ulcerative calluses   2. Diabetes mellitus without complication (HCC)       Plan:  Patient was evaluated and treated and all questions answered. -Discussed and educated patient on diabetic foot care, especially with  regards to the vascular, neurological and musculoskeletal systems.  -Stressed the  importance of good glycemic control and the detriment of not  controlling glucose levels in relation to the foot. -Discussed supportive shoes at all times and checking feet regularly.  -Mechanically debrided hyperkeratotic tissue x 5 without incident with chisel.  -Answered all patient questions -Patient to return  in 3 months for at risk foot care -Patient advised to call the office if any problems or questions arise in the meantime.   Asberry Failing, DPM

## 2023-10-30 ENCOUNTER — Other Ambulatory Visit (HOSPITAL_BASED_OUTPATIENT_CLINIC_OR_DEPARTMENT_OTHER): Payer: Self-pay

## 2023-10-30 DIAGNOSIS — I10 Essential (primary) hypertension: Secondary | ICD-10-CM | POA: Diagnosis not present

## 2023-10-30 DIAGNOSIS — E1165 Type 2 diabetes mellitus with hyperglycemia: Secondary | ICD-10-CM | POA: Diagnosis not present

## 2023-10-30 DIAGNOSIS — E785 Hyperlipidemia, unspecified: Secondary | ICD-10-CM | POA: Diagnosis not present

## 2023-10-30 MED ORDER — MOUNJARO 10 MG/0.5ML ~~LOC~~ SOAJ
10.0000 mg | SUBCUTANEOUS | 5 refills | Status: DC
  Filled 2023-10-30: qty 2, 28d supply, fill #0
  Filled 2023-11-22: qty 2, 28d supply, fill #1
  Filled 2024-01-05: qty 2, 28d supply, fill #2
  Filled 2024-01-28: qty 2, 28d supply, fill #3
  Filled 2024-02-21: qty 2, 28d supply, fill #4
  Filled 2024-03-20: qty 2, 28d supply, fill #5

## 2023-11-03 ENCOUNTER — Ambulatory Visit: Payer: Medicare PPO

## 2023-11-03 DIAGNOSIS — I441 Atrioventricular block, second degree: Secondary | ICD-10-CM | POA: Diagnosis not present

## 2023-11-04 LAB — CUP PACEART REMOTE DEVICE CHECK
Battery Remaining Longevity: 112 mo
Battery Voltage: 2.99 V
Brady Statistic AP VP Percent: 0.01 %
Brady Statistic AP VS Percent: 0 %
Brady Statistic AS VP Percent: 99.98 %
Brady Statistic AS VS Percent: 0.02 %
Brady Statistic RA Percent Paced: 0.01 %
Brady Statistic RV Percent Paced: 99.98 %
Date Time Interrogation Session: 20250721012629
Implantable Lead Connection Status: 753985
Implantable Lead Connection Status: 753985
Implantable Lead Implant Date: 20220424
Implantable Lead Implant Date: 20220424
Implantable Lead Location: 753859
Implantable Lead Location: 753860
Implantable Lead Model: 3830
Implantable Lead Model: 5076
Implantable Pulse Generator Implant Date: 20220424
Lead Channel Impedance Value: 304 Ohm
Lead Channel Impedance Value: 342 Ohm
Lead Channel Impedance Value: 380 Ohm
Lead Channel Impedance Value: 494 Ohm
Lead Channel Pacing Threshold Amplitude: 0.875 V
Lead Channel Pacing Threshold Amplitude: 1 V
Lead Channel Pacing Threshold Pulse Width: 0.4 ms
Lead Channel Pacing Threshold Pulse Width: 0.4 ms
Lead Channel Sensing Intrinsic Amplitude: 2.5 mV
Lead Channel Sensing Intrinsic Amplitude: 2.5 mV
Lead Channel Sensing Intrinsic Amplitude: 4.875 mV
Lead Channel Sensing Intrinsic Amplitude: 4.875 mV
Lead Channel Setting Pacing Amplitude: 1.75 V
Lead Channel Setting Pacing Amplitude: 2 V
Lead Channel Setting Pacing Pulse Width: 0.4 ms
Lead Channel Setting Sensing Sensitivity: 2 mV
Zone Setting Status: 755011

## 2023-11-06 ENCOUNTER — Ambulatory Visit: Payer: Self-pay | Admitting: Cardiology

## 2024-01-12 ENCOUNTER — Ambulatory Visit (INDEPENDENT_AMBULATORY_CARE_PROVIDER_SITE_OTHER): Admitting: Family Medicine

## 2024-01-12 ENCOUNTER — Other Ambulatory Visit: Payer: Self-pay | Admitting: Family Medicine

## 2024-01-12 ENCOUNTER — Encounter: Payer: Self-pay | Admitting: Family Medicine

## 2024-01-12 VITALS — BP 112/68 | HR 73 | Temp 98.2°F | Ht 72.0 in | Wt 162.6 lb

## 2024-01-12 DIAGNOSIS — Z7984 Long term (current) use of oral hypoglycemic drugs: Secondary | ICD-10-CM | POA: Diagnosis not present

## 2024-01-12 DIAGNOSIS — E1169 Type 2 diabetes mellitus with other specified complication: Secondary | ICD-10-CM

## 2024-01-12 DIAGNOSIS — Z7985 Long-term (current) use of injectable non-insulin antidiabetic drugs: Secondary | ICD-10-CM | POA: Diagnosis not present

## 2024-01-12 DIAGNOSIS — E785 Hyperlipidemia, unspecified: Secondary | ICD-10-CM | POA: Diagnosis not present

## 2024-01-12 DIAGNOSIS — Z23 Encounter for immunization: Secondary | ICD-10-CM

## 2024-01-12 DIAGNOSIS — Z95811 Presence of heart assist device: Secondary | ICD-10-CM | POA: Diagnosis not present

## 2024-01-12 DIAGNOSIS — Z0000 Encounter for general adult medical examination without abnormal findings: Secondary | ICD-10-CM | POA: Diagnosis not present

## 2024-01-12 DIAGNOSIS — Z125 Encounter for screening for malignant neoplasm of prostate: Secondary | ICD-10-CM | POA: Diagnosis not present

## 2024-01-12 DIAGNOSIS — I10 Essential (primary) hypertension: Secondary | ICD-10-CM | POA: Diagnosis not present

## 2024-01-12 DIAGNOSIS — E119 Type 2 diabetes mellitus without complications: Secondary | ICD-10-CM

## 2024-01-12 LAB — CBC WITH DIFFERENTIAL/PLATELET
Basophils Absolute: 0 K/uL (ref 0.0–0.1)
Basophils Relative: 0.6 % (ref 0.0–3.0)
Eosinophils Absolute: 0.2 K/uL (ref 0.0–0.7)
Eosinophils Relative: 3.5 % (ref 0.0–5.0)
HCT: 44.1 % (ref 39.0–52.0)
Hemoglobin: 14.8 g/dL (ref 13.0–17.0)
Lymphocytes Relative: 14.5 % (ref 12.0–46.0)
Lymphs Abs: 0.8 K/uL (ref 0.7–4.0)
MCHC: 33.5 g/dL (ref 30.0–36.0)
MCV: 91.8 fl (ref 78.0–100.0)
Monocytes Absolute: 0.5 K/uL (ref 0.1–1.0)
Monocytes Relative: 8.4 % (ref 3.0–12.0)
Neutro Abs: 4.2 K/uL (ref 1.4–7.7)
Neutrophils Relative %: 73 % (ref 43.0–77.0)
Platelets: 271 K/uL (ref 150.0–400.0)
RBC: 4.8 Mil/uL (ref 4.22–5.81)
RDW: 13.8 % (ref 11.5–15.5)
WBC: 5.8 K/uL (ref 4.0–10.5)

## 2024-01-12 LAB — COMPREHENSIVE METABOLIC PANEL WITH GFR
ALT: 18 U/L (ref 0–53)
AST: 14 U/L (ref 0–37)
Albumin: 4.4 g/dL (ref 3.5–5.2)
Alkaline Phosphatase: 52 U/L (ref 39–117)
BUN: 19 mg/dL (ref 6–23)
CO2: 29 meq/L (ref 19–32)
Calcium: 9.5 mg/dL (ref 8.4–10.5)
Chloride: 103 meq/L (ref 96–112)
Creatinine, Ser: 0.93 mg/dL (ref 0.40–1.50)
GFR: 84.35 mL/min (ref >60.00)
Glucose, Bld: 201 mg/dL — ABNORMAL HIGH (ref 70–99)
Potassium: 4.8 meq/L (ref 3.5–5.1)
Sodium: 139 meq/L (ref 135–145)
Total Bilirubin: 0.5 mg/dL (ref 0.2–1.2)
Total Protein: 6.8 g/dL (ref 6.0–8.3)

## 2024-01-12 LAB — LIPID PANEL
Cholesterol: 121 mg/dL (ref 0–200)
HDL: 51.2 mg/dL (ref >39.00)
LDL Cholesterol: 57 mg/dL (ref 0–99)
NonHDL: 69.58
Total CHOL/HDL Ratio: 2
Triglycerides: 61 mg/dL (ref 0.0–149.0)
VLDL: 12.2 mg/dL (ref 0.0–40.0)

## 2024-01-12 NOTE — Patient Instructions (Addendum)
 Tetanus, Diphtheria, and Pertussis (Tdap) at pharmacy recommended   Please stop by lab before you go If you have mychart- we will send your results within 3 business days of us  receiving them.  If you do not have mychart- we will call you about results within 5 business days of us  receiving them.  *please also note that you will see labs on mychart as soon as they post. I will later go in and write notes on them- will say notes from Dr. Katrinka   No changes today unless labs lead us  to make changes  Recommended follow up: Return in about 1 year (around 01/11/2025) for physical or sooner if needed.Schedule b4 you leave.

## 2024-01-12 NOTE — Progress Notes (Signed)
 Phone: [phone]   Subjective:  Patient presents today for their annual physical. Chief complaint-noted.   See problem oriented charting- ROS- full  review of systems was completed and negative  except for topics noted under acute/chronic concerns  The following were reviewed and entered/updated in epic: Past Medical History:  Diagnosis Date   Acute prostatitis 12/02/2009   Qualifier: Diagnosis of  By: Jimmy MD, Charlie Scarlet    Allergy    Hay fever   AV block    ED (erectile dysfunction)    sildenafil  100 mg sparingly   History of colonic polyps    Hyperlipidemia    Hypertension    S/P placement of cardiac pacemaker    Symptomatic bradycardia    Type 2 diabetes mellitus 2007   Patient Active Problem List   Diagnosis Date Noted   S/P placement of cardiac pacemaker 08/06/2020    Priority: High   Mobitz type 2 second degree AV block 08/04/2020    Priority: High   Diabetes mellitus without complication (HCC) 12/23/2006    Priority: High   Hyperlipidemia associated with type 2 diabetes mellitus (HCC) 02/02/2018    Priority: Medium    Essential hypertension 12/23/2006    Priority: Medium    ED (erectile dysfunction)     Priority: Low   History of colonic polyps 01/18/2008    Priority: Low   Presence of heart assist device (HCC) 01/12/2024   Aortic insufficiency 11/18/2022   RBBB 07/14/2018   Past Surgical History:  Procedure Laterality Date   None     PACEMAKER IMPLANT N/A 08/06/2020   Procedure: PACEMAKER IMPLANT;  Surgeon: Cindie Ole DASEN, MD;  Location: [facility];  Service: Cardiovascular;  Laterality: N/A;    Family History  Problem Relation Age of Onset   Hypertension Mother    Stroke Mother        53, first stroke 52. never smoker   Diabetes Mother        in late life   Dementia Father    CAD Father        CABG mid 79s, never smoker   Hearing loss Father    Hypertension Sister    Diabetes Brother    Hypertension Brother    Prostate  cancer Brother        58. died at 80   Pneumonia Maternal Grandfather        died from this   Diabetes Paternal Grandfather    Cerebral palsy Daughter    Cerebral palsy Daughter    Colon cancer Neg Hx    Esophageal cancer Neg Hx    Rectal cancer Neg Hx    Stomach cancer Neg Hx     Medications- reviewed and updated Current Outpatient Medications  Medication Sig Dispense Refill   acetaminophen  (TYLENOL ) 500 MG tablet Take 1,000 mg by mouth every 6 (six) hours as needed for mild pain.     amLODipine  (NORVASC ) 5 MG tablet Take 1 tablet (5 mg total) by mouth daily. 90 tablet 2   atorvastatin  (LIPITOR) 20 MG tablet TAKE 1 TABLET BY MOUTH EVERY DAY 90 tablet 3   benazepril  (LOTENSIN ) 20 MG tablet TAKE 1 TABLET BY MOUTH EVERY DAY 90 tablet 3   blood glucose meter kit and supplies KIT Dispense based on patient and insurance preference. Use up to four times daily as directed. 1 each 3   cetirizine (ZYRTEC) 10 MG tablet Take 10 mg by mouth daily as needed for allergies.  clobetasol  cream (TEMOVATE ) 0.05 % Apply 1 Application to affected area on left foot 2 (two) times daily for 2-4 weeks. 30 g 0   Empagliflozin -metFORMIN  HCl (SYNJARDY ) 12.08-998 MG TABS Take 1 tablet by mouth 2 (two) times daily with a meal 60 tablet 5   glucose blood (ACCU-CHEK GUIDE TEST) test strip Use as instructed to check blood sugar twice daily. 100 each 12   glucose blood (CONTOUR NEXT TEST) test strip Use to check blood glucose twice daily 100 strip 5   Multiple Vitamin (MULTIVITAMIN) capsule Take 1 capsule by mouth daily.     ONETOUCH DELICA LANCETS FINE MISC 1 each by Does not apply route 2 (two) times daily. 100 each 3   tadalafil  (CIALIS ) 20 MG tablet TAKE 1 TABLET (20 MG TOTAL) BY MOUTH EVERY OTHER DAY AS NEEDED FOR ERECTILE DYSFUNCTION. 6 tablet 19   tirzepatide  (MOUNJARO ) 10 MG/0.5ML Pen Inject 10 mg into the skin every 7 (seven) days. 2 mL 5   urea  (CARMOL) 10 % cream Apply topically as needed. 85 g 0   No  current facility-administered medications for this visit.    Allergies-reviewed and updated No Known Allergies  Social History   Social History Narrative   Married. Identical twin daughters 69 in 2019- mirror twins- born 56 weeks. Both daughters have CP and are wheelchair bound - both live in [place] - graduated top of their classes and have MBA from [employer]. 1 daughters married- they all live together.       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       Hobbies: travel, walks and hikes, skiing       Objective  Objective:  BP 112/68 (BP Location: Left Arm, Patient Position: Sitting, Cuff Size: Normal)   Pulse 73   Temp 98.2 F (36.8 C) (Temporal)   Ht 6' (1.829 m)   Wt 162 lb 9.6 oz (73.8 kg)   SpO2 96%   BMI 22.05 kg/m  Gen: NAD, resting comfortably HEENT: Mucous membranes are moist. Oropharynx normal Neck: no thyromegaly CV: RRR no murmurs rubs or gallops Lungs: CTAB no crackles, wheeze, rhonchi Abdomen: soft/nontender/nondistended/normal bowel sounds. No rebound or guarding.  Ext: no edema Skin: warm, dry Neuro: grossly normal, moves all extremities, PERRLA  Diabetic foot exam was performed with the following findings:   No deformities, ulcerations, or other skin breakdown Normal sensation of 10g monofilament Intact posterior tibialis and dorsalis pedis pulses      Assessment and Plan  68 y.o. male presenting for annual physical.  Health Maintenance counseling: 1. Anticipatory guidance: Patient counseled regarding regular dental exams -q4 months, eye exams - yearly,  avoiding smoking and second hand smoke , limiting alcohol to 2 beverages per day - 1 beverage per day, no illicit drugs .   2. Risk factor reduction:  Advised patient of need for regular exercise and diet rich and fruits and vegetables to reduce risk of heart attack and stroke.  Exercise- ups and downs- trying to get back into it with better weather.  Diet/weight management-healthy  weight still- trying to eat reasonably healthy .  Wt Readings from Last 3 Encounters:  01/12/24 162 lb 9.6 oz (73.8 kg)  07/24/23 159 lb 6.4 oz (72.3 kg)  03/04/23 152 lb (68.9 kg)   3. Immunizations/screenings/ancillary studies- Tetanus, Diphtheria, and Pertussis (Tdap) at pharmacy. Just did flu shot.  Immunization History  Administered Date(s) Administered   Fluad Quad(high Dose 65+) 02/02/2021   Fluad Trivalent(High Dose 65+) 12/24/2022  INFLUENZA, HIGH DOSE SEASONAL PF 01/30/2022, 02/07/2023, 01/12/2024   Influenza Inj Mdck Quad With Preservative 02/13/2019   Influenza Split 12/26/2010, 01/02/2012   Influenza Whole 04/15/2005, 01/18/2008, 02/13/2010   Influenza,inj,Quad PF,6+ Mos 01/04/2013, 02/22/2014, 02/21/2015, 01/29/2016, 01/06/2017, 02/02/2018   PFIZER Comirnaty(Gray Top)Covid-19 Tri-Sucrose Vaccine 09/28/2020   PFIZER(Purple Top)SARS-COV-2 Vaccination 07/05/2019, 07/26/2019, 02/09/2020   PNEUMOCOCCAL CONJUGATE-20 09/12/2020   Pfizer Covid-19 Vaccine Bivalent Booster 97yrs & up 02/19/2021   Pfizer(Comirnaty)Fall Seasonal Vaccine 12 years and older 11/24/2023   Pneumococcal Polysaccharide-23 02/02/2018   Tdap 05/04/2012   Zoster Recombinant(Shingrix) 02/15/2020, 05/19/2020  4. Prostate cancer screening-  low risk prior trend- update psa today  Lab Results  Component Value Date   PSA 0.30 12/24/2022   PSA 0.23 08/21/2021   PSA 0.30 09/12/2020   5. Colon cancer screening - history tubular adenoma December 2024 with 3 year repeat with Dr. Wilhelmenia  6. Skin cancer screening- no recent dermatology visits. advised regular sunscreen use. Denies worrisome, changing, or new skin lesions.  7. Smoking associated screening (lung cancer screening, AAA screen 65-75, UA)- never smoker 8. STD screening - only active with wife  Status of chronic or acute concerns   #Second-degree AV block/symptomatic bradycardia-lead to postpartum pacemaker placement 08/06/2020.  Follows with Dr.  Victor primary cardiologist and Dr. Cindie of electrophysiology   # Diabetes-A1c typically under 7 S: Medication: Synjardy  extended release (Jardiance  and metformin ) 12.08-998 mg extended release twice daily, Trulicity  4.5 mg--> Mounjaro  up to 10 mg  Lab Results  Component Value Date   HGBA1C 7.1 10/23/2023   HGBA1C 7.9 (H) 12/24/2022   HGBA1C 7.6 (H) 09/12/2022  A/P: a1c trending down to 7.1 in July and hopefully better now- team will abstract last a1c and has follow up in November- continue current medications for now  - do not think he has space for [place] in regards to UACR and already on jardiance  and benazepril    #hypertension S: medication: Benazepril  10 mg twice daily , amlodipine  5 mg -slight dry cough on benazepril    but not bothering him A/P: stable- continue current medicines    #hyperlipidemia S: Medication: Atorvastatin  20 mg daily   Lab Results  Component Value Date   CHOL 112 12/24/2022   HDL 45.70 12/24/2022   LDLCALC 47 12/24/2022   LDLDIRECT 52.0 07/14/2018   TRIG 96.0 12/24/2022   CHOLHDL 2 12/24/2022   A/P: well controlled last year- update today   #Erectile Dysfunction- imperfect control- does not need refill  Recommended follow up: No follow-ups on file. Future Appointments  Date Time Provider Department Center  01/28/2024  2:45 PM Joya Stabs, DPM [facility]  02/02/2024  7:00 AM [facility] DEVICE [facility]OT[facility]  05/03/2024  7:00 AM [facility] DEVICE REM[facility]ES [facility]  08/02/2024  7:00 AM [facility] DEVICE [facility]  11/01/2024  7:00 AM [facility] DEVICE [facility]  01/31/2025  7:00 AM [facility] DEVICE REMOT[facility]  05/02/2025  7:00 AM [facility] DEVICE REMOTES [facility]   Lab/Order associations:NOT fasting- sandwich and chips at lunch, cereal for breakfast   ICD-10-CM   1. Preventative health care  Z00.00     2. Need for influenza vaccination  Z23 Flu vaccine HIGH DOSE PF(Fluzone Trivalent)    3.  Diabetes mellitus without complication (HCC)  E11.9     4. Essential hypertension  I10     5. Hyperlipidemia, unspecified hyperlipidemia type  E78.5     6. Hyperlipidemia associated with type 2 diabetes  mellitus (HCC)  E11.69    E78.5     7. Screening for prostate cancer  Z12.5     8. Presence of heart assist device (HCC)  Z95.811       No orders of the defined types were placed in this encounter.   Return precautions advised.  Garnette Lukes, MD

## 2024-01-13 ENCOUNTER — Ambulatory Visit: Payer: Self-pay | Admitting: Family Medicine

## 2024-01-13 LAB — PSA, MEDICARE: PSA: 0.33 ng/mL (ref 0.10–4.00)

## 2024-01-15 ENCOUNTER — Other Ambulatory Visit

## 2024-01-15 NOTE — Addendum Note (Signed)
 Addended by: Krystian Ferrentino on: 01/15/2024 08:36 AM   Modules accepted: Orders

## 2024-01-16 LAB — SPECIMEN STATUS REPORT

## 2024-01-16 LAB — MICROALBUMIN / CREATININE URINE RATIO
Creatinine, Urine: 40.3 mg/dL
Microalb/Creat Ratio: 99 mg/g{creat} — ABNORMAL HIGH (ref 0–29)
Microalbumin, Urine: 39.8 ug/mL

## 2024-01-19 ENCOUNTER — Ambulatory Visit: Payer: Self-pay | Admitting: Family Medicine

## 2024-01-19 NOTE — Progress Notes (Signed)
 Results being faxed to our office today from [facility]. Not sure why results have not crossed over into Epic. Will Abstract and send to scan.

## 2024-01-19 NOTE — Progress Notes (Signed)
 Remote PPM Transmission

## 2024-01-27 ENCOUNTER — Other Ambulatory Visit (HOSPITAL_BASED_OUTPATIENT_CLINIC_OR_DEPARTMENT_OTHER): Payer: Self-pay

## 2024-01-27 MED ORDER — KERENDIA 10 MG PO TABS
10.0000 mg | ORAL_TABLET | Freq: Every day | ORAL | 5 refills | Status: AC
  Filled 2024-01-27: qty 30, 30d supply, fill #0
  Filled 2024-02-21: qty 30, 30d supply, fill #1
  Filled 2024-03-20: qty 30, 30d supply, fill #2
  Filled 2024-04-22: qty 30, 30d supply, fill #3
  Filled 2024-05-16: qty 30, 30d supply, fill #4

## 2024-01-27 NOTE — Telephone Encounter (Signed)
 Which mg for kerendia and I will send it in.

## 2024-01-28 ENCOUNTER — Ambulatory Visit: Admitting: Podiatry

## 2024-01-28 ENCOUNTER — Other Ambulatory Visit (HOSPITAL_BASED_OUTPATIENT_CLINIC_OR_DEPARTMENT_OTHER): Payer: Self-pay

## 2024-02-02 ENCOUNTER — Ambulatory Visit: Payer: Medicare PPO

## 2024-02-02 DIAGNOSIS — I441 Atrioventricular block, second degree: Secondary | ICD-10-CM | POA: Diagnosis not present

## 2024-02-03 LAB — CUP PACEART REMOTE DEVICE CHECK
Battery Remaining Longevity: 109 mo
Battery Voltage: 2.99 V
Brady Statistic AP VP Percent: 0.01 %
Brady Statistic AP VS Percent: 0 %
Brady Statistic AS VP Percent: 99.97 %
Brady Statistic AS VS Percent: 0.02 %
Brady Statistic RA Percent Paced: 0.01 %
Brady Statistic RV Percent Paced: 99.98 %
Date Time Interrogation Session: 20251019222842
Implantable Lead Connection Status: 753985
Implantable Lead Connection Status: 753985
Implantable Lead Implant Date: 20220424
Implantable Lead Implant Date: 20220424
Implantable Lead Location: 753859
Implantable Lead Location: 753860
Implantable Lead Model: 3830
Implantable Lead Model: 5076
Implantable Pulse Generator Implant Date: 20220424
Lead Channel Impedance Value: 304 Ohm
Lead Channel Impedance Value: 361 Ohm
Lead Channel Impedance Value: 399 Ohm
Lead Channel Impedance Value: 494 Ohm
Lead Channel Pacing Threshold Amplitude: 0.875 V
Lead Channel Pacing Threshold Amplitude: 1 V
Lead Channel Pacing Threshold Pulse Width: 0.4 ms
Lead Channel Pacing Threshold Pulse Width: 0.4 ms
Lead Channel Sensing Intrinsic Amplitude: 3.25 mV
Lead Channel Sensing Intrinsic Amplitude: 3.25 mV
Lead Channel Sensing Intrinsic Amplitude: 4.875 mV
Lead Channel Sensing Intrinsic Amplitude: 4.875 mV
Lead Channel Setting Pacing Amplitude: 1.75 V
Lead Channel Setting Pacing Amplitude: 2 V
Lead Channel Setting Pacing Pulse Width: 0.4 ms
Lead Channel Setting Sensing Sensitivity: 2 mV
Zone Setting Status: 755011

## 2024-02-06 NOTE — Progress Notes (Signed)
 Remote PPM Transmission

## 2024-02-09 ENCOUNTER — Ambulatory Visit: Payer: Self-pay | Admitting: Cardiology

## 2024-02-11 ENCOUNTER — Ambulatory Visit (INDEPENDENT_AMBULATORY_CARE_PROVIDER_SITE_OTHER): Admitting: Podiatry

## 2024-02-11 DIAGNOSIS — Z91199 Patient's noncompliance with other medical treatment and regimen due to unspecified reason: Secondary | ICD-10-CM

## 2024-02-11 NOTE — Progress Notes (Signed)
 No show

## 2024-02-16 ENCOUNTER — Encounter: Payer: Self-pay | Admitting: Podiatry

## 2024-02-16 ENCOUNTER — Ambulatory Visit: Admitting: Podiatry

## 2024-02-16 DIAGNOSIS — L84 Corns and callosities: Secondary | ICD-10-CM | POA: Diagnosis not present

## 2024-02-16 DIAGNOSIS — E119 Type 2 diabetes mellitus without complications: Secondary | ICD-10-CM | POA: Diagnosis not present

## 2024-02-16 NOTE — Progress Notes (Signed)
  Subjective:  Patient ID: Justin [name], male    DOB: 1955/08/19,   MRN: [id number]  Chief Complaint  Patient presents with   Diabetes    It's not too bad.  I started to feel that corn in the past week or so.  Saw Harlene Russel - 10/30/2023; A1c - 7.1    68 y.o. male presents for concern of thickened and painful calluses. Requesting to have them trimmed today. Relates burning and tingling in their feet. Patient is diabetic and last A1c was  Lab Results  Component Value Date   HGBA1C 7.1 10/23/2023   .   PCP:  Katrinka Garnette KIDD, MD    . Denies any other pedal complaints. Denies n/v/f/c.   Past Medical History:  Diagnosis Date   Acute prostatitis 12/02/2009   Qualifier: Diagnosis of  By: Jimmy MD, Charlie Scarlet    Allergy    Hay fever   AV block    ED (erectile dysfunction)    sildenafil  100 mg sparingly   History of colonic polyps    Hyperlipidemia    Hypertension    S/P placement of cardiac pacemaker    Symptomatic bradycardia    Type 2 diabetes mellitus 2007    Objective:  Physical Exam: Vascular: DP/PT pulses 2/4 bilateral. CFT <3 seconds. Absent hair growth on digits. Edema noted to bilateral lower extremities. Xerosis noted bilaterally.  Skin. No lacerations or abrasions bilateral feet. Nails 1-5 bilateral  are normal in appearance. There is hyperkeratosis noted  sub first and fifth metatarsal bilateral and lateral fourth digit on the right.  Musculoskeletal: MMT 5/5 bilateral lower extremities in DF, PF, Inversion and Eversion. Deceased ROM in DF of ankle joint.  Neurological: Sensation intact to light touch. Protective sensation diminished bilateral.     Assessment:   1. Pre-ulcerative calluses   2. Diabetes mellitus without complication (HCC)       Plan:  Patient was evaluated and treated and all questions answered. -Discussed and educated patient on diabetic foot care, especially with  regards to the vascular, neurological and musculoskeletal  systems.  -Stressed the importance of good glycemic control and the detriment of not  controlling glucose levels in relation to the foot. -Discussed supportive shoes at all times and checking feet regularly.  -Mechanically debrided hyperkeratotic tissue x 5 without incident with chisel.  -Answered all patient questions -Patient to return  in 3 months for at risk foot care -Patient advised to call the office if any problems or questions arise in the meantime.   Asberry Failing, DPM

## 2024-02-17 ENCOUNTER — Other Ambulatory Visit: Payer: Self-pay | Admitting: Student

## 2024-02-27 ENCOUNTER — Ambulatory Visit: Admitting: Family Medicine

## 2024-02-27 ENCOUNTER — Encounter: Payer: Self-pay | Admitting: Family Medicine

## 2024-02-27 VITALS — BP 126/58 | HR 85 | Temp 97.7°F | Ht 72.0 in | Wt 160.8 lb

## 2024-02-27 DIAGNOSIS — M20012 Mallet finger of left finger(s): Secondary | ICD-10-CM | POA: Diagnosis not present

## 2024-02-27 DIAGNOSIS — E1129 Type 2 diabetes mellitus with other diabetic kidney complication: Secondary | ICD-10-CM | POA: Diagnosis not present

## 2024-02-27 DIAGNOSIS — Z7984 Long term (current) use of oral hypoglycemic drugs: Secondary | ICD-10-CM

## 2024-02-27 DIAGNOSIS — E1169 Type 2 diabetes mellitus with other specified complication: Secondary | ICD-10-CM

## 2024-02-27 DIAGNOSIS — I10 Essential (primary) hypertension: Secondary | ICD-10-CM

## 2024-02-27 DIAGNOSIS — R809 Proteinuria, unspecified: Secondary | ICD-10-CM | POA: Diagnosis not present

## 2024-02-27 NOTE — Patient Instructions (Addendum)
 Please stop by lab before you go If you have mychart- we will send your results within 3 business days of us  receiving them.  If you do not have mychart- we will call you about results within 5 business days of us  receiving them.  *please also note that you will see labs on mychart as soon as they post. I will later go in and write notes on them- will say notes from Dr. Katrinka Finder you are tolerating the Leonore- only issue would be if potassium goes too high  We have placed a referral for you today to [facility] - please call their # if you do not hear within a week (may be listed below or you may see mychart message within a few days with #). Honestly they should call you by Monday or Tuesday and hope you can be seen within a few days  Wear splint at all times and try to support finger if taking out of splint. Chance you may still need to see hand surgeon  Recommended follow up: Return in about 6 months (around 08/26/2024) for followup or sooner if needed.Schedule b4 you leave.

## 2024-02-27 NOTE — Progress Notes (Signed)
 Phone [phone] In person visit   Subjective:   NOSSON WENDER is a 68 y.o. year old very pleasant male patient who presents for/with See problem oriented charting Chief Complaint  Patient presents with   Follow-up    Follow-up for new medication Finerenone.     Past Medical History-  Patient Active Problem List   Diagnosis Date Noted   Microalbuminuria 02/27/2024    Priority: High   S/P placement of cardiac pacemaker 08/06/2020    Priority: High   Mobitz type 2 second degree AV block 08/04/2020    Priority: High   Type 2 diabetes mellitus with other specified complication (HCC) 12/23/2006    Priority: High   Hyperlipidemia associated with type 2 diabetes mellitus (HCC) 02/02/2018    Priority: Medium    Essential hypertension 12/23/2006    Priority: Medium    ED (erectile dysfunction)     Priority: Low   History of colonic polyps 01/18/2008    Priority: Low   Presence of heart assist device (HCC) 01/12/2024   Aortic insufficiency 11/18/2022   RBBB 07/14/2018    Medications- reviewed and updated Current Outpatient Medications  Medication Sig Dispense Refill   acetaminophen  (TYLENOL ) 500 MG tablet Take 1,000 mg by mouth every 6 (six) hours as needed for mild pain.     amLODipine  (NORVASC ) 5 MG tablet TAKE 1 TABLET (5 MG TOTAL) BY MOUTH DAILY. 30 tablet 0   atorvastatin  (LIPITOR) 20 MG tablet TAKE 1 TABLET BY MOUTH EVERY DAY 90 tablet 3   benazepril  (LOTENSIN ) 20 MG tablet TAKE 1 TABLET BY MOUTH EVERY DAY 90 tablet 3   blood glucose meter kit and supplies KIT Dispense based on patient and insurance preference. Use up to four times daily as directed. 1 each 3   cetirizine (ZYRTEC) 10 MG tablet Take 10 mg by mouth daily as needed for allergies.     Empagliflozin -metFORMIN  HCl (SYNJARDY ) 12.08-998 MG TABS Take 1 tablet by mouth 2 (two) times daily with a meal 60 tablet 5   Finerenone (KERENDIA) 10 MG TABS Take 1 tablet (10 mg total) by mouth daily. 30 tablet 5    glucose blood (ACCU-CHEK GUIDE TEST) test strip Use as instructed to check blood sugar twice daily. 100 each 12   glucose blood (CONTOUR NEXT TEST) test strip Use to check blood glucose twice daily 100 strip 5   Multiple Vitamin (MULTIVITAMIN) capsule Take 1 capsule by mouth daily.     ONETOUCH DELICA LANCETS FINE MISC 1 each by Does not apply route 2 (two) times daily. 100 each 3   tadalafil  (CIALIS ) 20 MG tablet TAKE 1 TABLET (20 MG TOTAL) BY MOUTH EVERY OTHER DAY AS NEEDED FOR ERECTILE DYSFUNCTION. 6 tablet 19   tirzepatide  (MOUNJARO ) 10 MG/0.5ML Pen Inject 10 mg into the skin every 7 (seven) days. 2 mL 5   No current facility-administered medications for this visit.     Objective:  BP (!) 126/58   Pulse 85   Temp 97.7 F (36.5 C) (Temporal)   Ht 6' (1.829 m)   Wt 160 lb 12.8 oz (72.9 kg)   SpO2 98%   BMI 21.81 kg/m  Gen: NAD, resting comfortably Patient with inability to extend left third finger-some swelling and erythema around DIP joint as well    Assessment and Plan   FALL/finger injury S: fall while walking with dog about 4 weeks ago. Landed hands out with right hand on grass but left hand on pavement. Fingertips torn up and  the left 3rd finger got pulled underneath  He is left handed A/P: Patient with what appears to be mallet finger of left third finger (dominant hand).  Discussed constant splinting need but also the fact that the delayed visit from time of injury may make healing more challenging-referred to sports medicine for their expertise and discussed possibility of seeing hand surgeon as well in future if doesn't respond to conservative efforts   # Diabetes-A1c typically under 7- sees [facility] Harlene Bill endocrine NP # Microalbuminuria-on finerenone/Kerendia 10 mg S: Medication: Synjardy  extended release (Jardiance  and metformin ) 12.08-998 mg extended release twice daily, Trulicity  4.5 mg--> Mounjaro  10mg  CBGs- somewhat stubborn- morning sugars occasionally below  120 but usually 120s Lab Results  Component Value Date   HGBA1C 7.1 10/23/2023   HGBA1C 7.9 (H) 12/24/2022   HGBA1C 7.6 (H) 09/12/2022  A/P: diabetes improving but mild poor control still- hoping for more improvement  # Microalbuminuria despite benazepril  10 mg twice daily and Jardiance  25 mg- added Kerendia- update CMP today and next visit we will add UACR   #hypertension S: medication: Benazepril  20 mg daily , amlodipine  5 mg and Kerendia recently added  Home readings #s: 120s/60's typically or 70s at home  BP Readings from Last 3 Encounters:  02/27/24 (!) 126/58  01/12/24 112/68  07/24/23 120/64  A/P: blood pressure well controlled- thankfully has not dipped too much with addition of kerendia    Recommended follow up: Return in about 6 months (around 08/26/2024) for followup or sooner if needed.Schedule b4 you leave. Future Appointments  Date Time Provider Department Center  05/03/2024  7:00 AM [facility] DEVICE REMOTES [facility]  05/19/2024  1:45 PM Joya Stabs, DPM [facility]  08/02/2024  7:00 AM [facility] DEVICE REMOTES [facility]  11/01/2024  7:00 AM [facility] DEVICE REMOTES [facility]  01/13/2025  2:20 PM Katrinka Garnette KIDD, MD [facility] [place]  01/31/2025  7:00 AM [facility] HVT DEVICE REMOTES [facility]  05/02/2025  7:00 AM [facility] DEVICE REMOTES [facility]    Lab/Order associations:   ICD-10-CM   1. Type 2 diabetes mellitus with other specified complication, without long-term current use of insulin  (HCC)  E11.69 Comprehensive metabolic panel with GFR    Comprehensive metabolic panel with GFR    CANCELED: Comprehensive metabolic panel with GFR    CANCELED: Comprehensive metabolic panel with GFR    2. Essential hypertension  I10 Comprehensive metabolic panel with GFR    Comprehensive metabolic panel with GFR    CANCELED: Comprehensive metabolic panel with GFR    CANCELED: Comprehensive metabolic panel with GFR    3. Microalbuminuria  R80.9  Comprehensive metabolic panel with GFR    Comprehensive metabolic panel with GFR    CANCELED: Comprehensive metabolic panel with GFR    CANCELED: Comprehensive metabolic panel with GFR    4. Mallet finger of left hand  M20.012 Ambulatory referral to Sports Medicine      No orders of the defined types were placed in this encounter.   Return precautions advised.  Garnette Katrinka, MD

## 2024-02-28 LAB — COMPREHENSIVE METABOLIC PANEL WITH GFR
AG Ratio: 1.8 (calc) (ref 1.0–2.5)
ALT: 14 U/L (ref 9–46)
AST: 12 U/L (ref 10–35)
Albumin: 4.5 g/dL (ref 3.6–5.1)
Alkaline phosphatase (APISO): 44 U/L (ref 35–144)
BUN/Creatinine Ratio: 29 (calc) — ABNORMAL HIGH (ref 6–22)
BUN: 26 mg/dL — ABNORMAL HIGH (ref 7–25)
CO2: 26 mmol/L (ref 20–32)
Calcium: 10.1 mg/dL (ref 8.6–10.3)
Chloride: 103 mmol/L (ref 98–110)
Creat: 0.9 mg/dL (ref 0.70–1.35)
Globulin: 2.5 g/dL (ref 1.9–3.7)
Glucose, Bld: 203 mg/dL — ABNORMAL HIGH (ref 65–99)
Potassium: 5 mmol/L (ref 3.5–5.3)
Sodium: 138 mmol/L (ref 135–146)
Total Bilirubin: 0.5 mg/dL (ref 0.2–1.2)
Total Protein: 7 g/dL (ref 6.1–8.1)
eGFR: 93 mL/min/1.73m2 (ref >=60)

## 2024-03-01 ENCOUNTER — Ambulatory Visit: Payer: Self-pay | Admitting: Family Medicine

## 2024-03-05 NOTE — Progress Notes (Unsigned)
 [name] [address] [place] [address] Phone: [phone]   Assessment and Plan:     1. Left hand pain (Primary) 2. Closed fracture of distal phalanx of left middle finger with mallet deformity -Subacute, initial visit - Mallet deformity with closed fracture of distal phalanx from fall 6 weeks ago - X-ray obtained in clinic.  My interpretation: Cortical irregularity over dorsal surface of DIP consistent with mild deformity with fracture - Recommend keeping DIP in full extension at all times.  Brace provided - We discussed that since patient is 6 weeks from fall, however <12 weeks out from injury, there is the potential for patient to heal with conservative therapy including maintaining extension at all times for 4 to 6 weeks.  Patient interested in establishing care with orthopedic hand surgery in case conservative therapy fails.  Referral sent -Discussed that we could use p.o. prednisone course versus NSAIDs to decrease inflammation, however pain is not limiting patient from day-to-day activities, so no additional medication prescribed at today's visit.  Patient may use OTC Tylenol /ibuprofen as needed    Pertinent previous records reviewed include family medicine note 02/27/2024   Follow Up: As needed if no improvement or worsening of symptoms.  Could repeat x-ray to ensure healing fracture   Subjective:   I, Justin [name], am serving as a neurosurgeon for Doctor Justin [name]  Chief Complaint: left hand pain   HPI:   03/09/2024 Patient is a 68 year old male with left hand pain. Patient states he fell about 5-6 weeks ago. Mallet finger deformity. ROM is decreased can't get extension. No meds for the pain .    Relevant Historical Information: Hypertension, DM type II  Additional pertinent review of systems negative.   Current Outpatient Medications:    acetaminophen  (TYLENOL ) 500 MG tablet, Take 1,000 mg by mouth  every 6 (six) hours as needed for mild pain., Disp: , Rfl:    amLODipine  (NORVASC ) 5 MG tablet, TAKE 1 TABLET (5 MG TOTAL) BY MOUTH DAILY., Disp: 30 tablet, Rfl: 0   atorvastatin  (LIPITOR) 20 MG tablet, TAKE 1 TABLET BY MOUTH EVERY DAY, Disp: 90 tablet, Rfl: 3   benazepril  (LOTENSIN ) 20 MG tablet, TAKE 1 TABLET BY MOUTH EVERY DAY, Disp: 90 tablet, Rfl: 3   blood glucose meter kit and supplies KIT, Dispense based on patient and insurance preference. Use up to four times daily as directed., Disp: 1 each, Rfl: 3   cetirizine (ZYRTEC) 10 MG tablet, Take 10 mg by mouth daily as needed for allergies., Disp: , Rfl:    Empagliflozin -metFORMIN  HCl (SYNJARDY ) 12.08-998 MG TABS, Take 1 tablet by mouth 2 (two) times daily with a meal, Disp: 60 tablet, Rfl: 5   Finerenone  (KERENDIA ) 10 MG TABS, Take 1 tablet (10 mg total) by mouth daily., Disp: 30 tablet, Rfl: 5   glucose blood (ACCU-CHEK GUIDE TEST) test strip, Use as instructed to check blood sugar twice daily., Disp: 100 each, Rfl: 12   glucose blood (CONTOUR NEXT TEST) test strip, Use to check blood glucose twice daily, Disp: 100 strip, Rfl: 5   Multiple Vitamin (MULTIVITAMIN) capsule, Take 1 capsule by mouth daily., Disp: , Rfl:    ONETOUCH DELICA LANCETS FINE MISC, 1 each by Does not apply route 2 (two) times daily., Disp: 100 each, Rfl: 3   tadalafil  (CIALIS ) 20 MG tablet, TAKE 1 TABLET (20 MG TOTAL) BY MOUTH EVERY OTHER DAY AS NEEDED FOR ERECTILE DYSFUNCTION., Disp: 6 tablet,  Rfl: 19   tirzepatide  (MOUNJARO ) 10 MG/0.5ML Pen, Inject 10 mg into the skin every 7 (seven) days., Disp: 2 mL, Rfl: 5   Objective:     Vitals:   03/09/24 0929  BP: 118/76  Pulse: 85  SpO2: 99%  Weight: 162 lb (73.5 kg)  Height: 6' (1.829 m)      Body mass index is 21.97 kg/m.    Physical Exam:    General: Appears well, nad, nontoxic and pleasant Neuro:sensation intact, strength is 5/5 in upper extremities, muscle tone wnl Skin:no susupicious lesions or  rashes  Left hand/Wrist:   Mallet deformity of left third digit at DIP with TTP, dorsal erythema.  No open lesion.  Able to straighten DIP passively.  Flexion and extension intact at PIP.  Flexion intact at DIP.  Extension not intact with active motion at DIP   Electronically signed by:  [name] 9:59 AM 03/09/24

## 2024-03-09 ENCOUNTER — Ambulatory Visit: Admitting: Sports Medicine

## 2024-03-09 ENCOUNTER — Ambulatory Visit

## 2024-03-09 VITALS — BP 118/76 | HR 85 | Ht 72.0 in | Wt 162.0 lb

## 2024-03-09 DIAGNOSIS — M79642 Pain in left hand: Secondary | ICD-10-CM

## 2024-03-09 DIAGNOSIS — M20012 Mallet finger of left finger(s): Secondary | ICD-10-CM

## 2024-03-09 DIAGNOSIS — S62637A Displaced fracture of distal phalanx of left little finger, initial encounter for closed fracture: Secondary | ICD-10-CM | POA: Diagnosis not present

## 2024-03-09 DIAGNOSIS — S62633A Displaced fracture of distal phalanx of left middle finger, initial encounter for closed fracture: Secondary | ICD-10-CM

## 2024-03-09 NOTE — Patient Instructions (Signed)
 Splint at all times   Ortho referral   As needed follow up

## 2024-03-13 ENCOUNTER — Other Ambulatory Visit: Payer: Self-pay | Admitting: Student

## 2024-03-17 ENCOUNTER — Ambulatory Visit: Payer: Self-pay | Admitting: Sports Medicine

## 2024-03-20 ENCOUNTER — Other Ambulatory Visit (HOSPITAL_BASED_OUTPATIENT_CLINIC_OR_DEPARTMENT_OTHER): Payer: Self-pay

## 2024-03-22 ENCOUNTER — Other Ambulatory Visit (HOSPITAL_BASED_OUTPATIENT_CLINIC_OR_DEPARTMENT_OTHER): Payer: Self-pay

## 2024-03-22 ENCOUNTER — Other Ambulatory Visit: Payer: Self-pay

## 2024-03-22 MED ORDER — CONTOUR NEXT TEST VI STRP
ORAL_STRIP | 5 refills | Status: AC
  Filled 2024-03-22 (×3): qty 50, 25d supply, fill #0

## 2024-03-24 ENCOUNTER — Other Ambulatory Visit (HOSPITAL_BASED_OUTPATIENT_CLINIC_OR_DEPARTMENT_OTHER): Payer: Self-pay

## 2024-03-30 DIAGNOSIS — M79646 Pain in unspecified finger(s): Secondary | ICD-10-CM | POA: Insufficient documentation

## 2024-03-30 DIAGNOSIS — M20012 Mallet finger of left finger(s): Secondary | ICD-10-CM | POA: Insufficient documentation

## 2024-03-31 ENCOUNTER — Other Ambulatory Visit: Payer: Self-pay | Admitting: Family Medicine

## 2024-04-01 ENCOUNTER — Telehealth (HOSPITAL_BASED_OUTPATIENT_CLINIC_OR_DEPARTMENT_OTHER): Payer: Self-pay | Admitting: *Deleted

## 2024-04-01 NOTE — Telephone Encounter (Signed)
 Patient scheduled for pre-op clearance on 04/20/24 with Katlyn West, NP.    Patient Consent for Virtual Visit        Justin [name] has provided verbal consent on 04/01/2024 for a virtual visit (video or telephone).   CONSENT FOR VIRTUAL VISIT FOR:  Justin [name]  By participating in this virtual visit I agree to the following:  I hereby voluntarily request, consent and authorize [facility] and its employed or contracted physicians, physician assistants, nurse practitioners or other licensed health care professionals (the Practitioner), to provide me with telemedicine health care services (the Services) as deemed necessary by the treating Practitioner. I acknowledge and consent to receive the Services by the Practitioner via telemedicine. I understand that the telemedicine visit will involve communicating with the Practitioner through live audiovisual communication technology and the disclosure of certain medical information by electronic transmission. I acknowledge that I have been given the opportunity to request an in-person assessment or other available alternative prior to the telemedicine visit and am voluntarily participating in the telemedicine visit.  I understand that I have the right to withhold or withdraw my consent to the use of telemedicine in the course of my care at any time, without affecting my right to future care or treatment, and that the Practitioner or I may terminate the telemedicine visit at any time. I understand that I have the right to inspect all information obtained and/or recorded in the course of the telemedicine visit and may receive copies of available information for a reasonable fee.  I understand that some of the potential risks of receiving the Services via telemedicine include:  Delay or interruption in medical evaluation due to technological equipment failure or disruption; Information transmitted may not be sufficient (e.g. poor resolution  of images) to allow for appropriate medical decision making by the Practitioner; and/or  In rare instances, security protocols could fail, causing a breach of personal health information.  Furthermore, I acknowledge that it is my responsibility to provide information about my medical history, conditions and care that is complete and accurate to the best of my ability. I acknowledge that Practitioner's advice, recommendations, and/or decision may be based on factors not within their control, such as incomplete or inaccurate data provided by me or distortions of diagnostic images or specimens that may result from electronic transmissions. I understand that the practice of medicine is not an exact science and that Practitioner makes no warranties or guarantees regarding treatment outcomes. I acknowledge that a copy of this consent can be made available to me via my patient portal [facility] MyChart), or I can request a printed copy by calling the office of  [facility].    I understand that my insurance will be billed for this visit.   I have read or had this consent read to me. I understand the contents of this consent, which adequately explains the benefits and risks of the Services being provided via telemedicine.  I have been provided ample opportunity to ask questions regarding this consent and the Services and have had my questions answered to my satisfaction. I give my informed consent for the services to be provided through the use of telemedicine in my medical care

## 2024-04-01 NOTE — Telephone Encounter (Signed)
° °  Name: Justin [name]  DOB: [birth year]  MRN: [id number]  Primary Cardiologist: Lonni LITTIE Nanas, MD   Preoperative team, please contact this patient and set up a phone call appointment for further preoperative risk assessment. Please obtain consent and complete medication review. Thank you for your help.  I confirm that guidance regarding antiplatelet and oral anticoagulation therapy has been completed and, if necessary, noted: None requested.  I also confirmed the patient resides in the state of [place] . As per [facility] Medical Board telemedicine laws, the patient must reside in the state in which the provider is licensed.  Miriam Shams, FNP-C  04/01/2024, 3:33 PM [facility] [address] Office [phone] Fax [phone]

## 2024-04-01 NOTE — Telephone Encounter (Signed)
° °  Pre-operative Risk Assessment    Patient Name: Justin [name]  DOB: 10-Oct-1955 MRN: [id number]   Date of last office visit: 07/24/23 [name], PAC Date of next office visit: 06/14/24 [name], [facility]   Request for Surgical Clearance    Procedure:  LEFT MIDDLE FINGER TENODERMODESIS EXTENSOR REPAIR DISTAL INTERPHALANGEAL JOINT PINNING   Date of Surgery:  Clearance TBD                                 Surgeon: DR. JINNY ROBYNN RIAS Surgeon's Group or Practice Name:  [facility] Phone number:  [phone] MEGAN DAVIS Fax number:  [phone]   Type of Clearance Requested:   - Medical    Type of Anesthesia:  MAC WITH BLOCK   Additional requests/questions:    Bonney Niels Jest   04/01/2024, 10:22 AM

## 2024-04-05 ENCOUNTER — Ambulatory Visit

## 2024-04-05 VITALS — BP 138/74 | Ht 72.0 in | Wt 162.0 lb

## 2024-04-05 DIAGNOSIS — Z0000 Encounter for general adult medical examination without abnormal findings: Secondary | ICD-10-CM

## 2024-04-05 NOTE — Progress Notes (Signed)
 "  Chief Complaint  Patient presents with   Medicare Wellness     Subjective:   Justin [name] is a 68 y.o. male who presents for a Medicare Annual Wellness Visit.  Visit info / Clinical Intake: Medicare Wellness Visit Type:: Subsequent Annual Wellness Visit Persons participating in visit and providing information:: patient Medicare Wellness Visit Mode:: Telephone If telephone:: video declined Since this visit was completed virtually, some vitals may be partially provided or unavailable. Missing vitals are due to the limitations of the virtual format.: Documented vitals are patient reported If Telephone or Video please confirm:: I connected with patient using audio/video enable telemedicine. I verified patient identity with two identifiers, discussed telehealth limitations, and patient agreed to proceed. Patient Location:: home Provider Location:: home office Interpreter Needed?: No Pre-visit prep was completed: yes AWV questionnaire completed by patient prior to visit?: yes Date:: 04/01/24 Living arrangements:: (Patient-Rptd) lives with spouse/significant other Patient's Overall Health Status Rating: (Patient-Rptd) very good Typical amount of pain: (Patient-Rptd) none Does pain affect daily life?: (Patient-Rptd) no Are you currently prescribed opioids?: no  Dietary Habits and Nutritional Risks How many meals a day?: (Patient-Rptd) 3 Eats fruit and vegetables daily?: (Patient-Rptd) yes Most meals are obtained by: (Patient-Rptd) preparing own meals In the last 2 weeks, have you had any of the following?: none Diabetic:: (!) yes Any non-healing wounds?: no How often do you check your BS?: as needed Would you like to be referred to a Nutritionist or for Diabetic Management? : no  Functional Status Activities of Daily Living (to include ambulation/medication): (Patient-Rptd) Independent Ambulation: Independent Medication Administration: (Patient-Rptd) Independent Home  Management (perform basic housework or laundry): (Patient-Rptd) Independent Manage your own finances?: (Patient-Rptd) yes Primary transportation is: (Patient-Rptd) driving Concerns about vision?: no *vision screening is required for WTM* Concerns about hearing?: no  Fall Screening Falls in the past year?: (Patient-Rptd) 1 Number of falls in past year: (Patient-Rptd) 0 Was there an injury with Fall?: (Patient-Rptd) 1 Fall Risk Category Calculator: (Patient-Rptd) 2 Patient Fall Risk Level: (Patient-Rptd) Moderate Fall Risk  Fall Risk Patient at Risk for Falls Due to: History of fall(s) (tripped while walking dog) Fall risk Follow up: Falls evaluation completed  Home and Transportation Safety: All rugs have non-skid backing?: (Patient-Rptd) yes All stairs or steps have railings?: (Patient-Rptd) yes Grab bars in the bathtub or shower?: (Patient-Rptd) yes Have non-skid surface in bathtub or shower?: (Patient-Rptd) yes Good home lighting?: (Patient-Rptd) yes Regular seat belt use?: (Patient-Rptd) yes Hospital stays in the last year:: (Patient-Rptd) no  Cognitive Assessment Difficulty concentrating, remembering, or making decisions? : (Patient-Rptd) no Will 6CIT or Mini Cog be Completed: yes What year is it?: 0 points What month is it?: 0 points Give patient an address phrase to remember (5 components): [address] [place]  About what time is it?: 0 points Count backwards from 20 to 1: 0 points Say the months of the year in reverse: 0 points Repeat the address phrase from earlier: 2 points 6 CIT Score: 2 points  Advance Directives (For Healthcare) Does Patient Have a Medical Advance Directive?: No Would patient like information on creating a medical advance directive?: No - Patient declined  Reviewed/Updated  Reviewed/Updated: Reviewed All (Medical, Surgical, Family, Medications, Allergies, Care Teams, Patient Goals)    Allergies (verified) Patient has no known allergies.    Current Medications (verified) Outpatient Encounter Medications as of 04/05/2024  Medication Sig   acetaminophen  (TYLENOL ) 500 MG tablet Take 1,000 mg by mouth every 6 (six) hours as  needed for mild pain.   amLODipine  (NORVASC ) 5 MG tablet TAKE 1 TABLET (5 MG TOTAL) BY MOUTH DAILY.   atorvastatin  (LIPITOR) 20 MG tablet TAKE 1 TABLET BY MOUTH EVERY DAY   benazepril  (LOTENSIN ) 20 MG tablet TAKE 1 TABLET BY MOUTH EVERY DAY   blood glucose meter kit and supplies KIT Dispense based on patient and insurance preference. Use up to four times daily as directed.   cetirizine (ZYRTEC) 10 MG tablet Take 10 mg by mouth daily as needed for allergies.   Empagliflozin -metFORMIN  HCl (SYNJARDY ) 12.08-998 MG TABS Take 1 tablet by mouth 2 (two) times daily with a meal   Finerenone  (KERENDIA ) 10 MG TABS Take 1 tablet (10 mg total) by mouth daily.   glucose blood (ACCU-CHEK GUIDE TEST) test strip Use as instructed to check blood sugar twice daily.   glucose blood (CONTOUR NEXT TEST) test strip Use as directed to check blood glucose twice daily.   Multiple Vitamin (MULTIVITAMIN) capsule Take 1 capsule by mouth daily.   ONETOUCH DELICA LANCETS FINE MISC 1 each by Does not apply route 2 (two) times daily.   tadalafil  (CIALIS ) 20 MG tablet TAKE 1 TABLET (20 MG TOTAL) BY MOUTH EVERY OTHER DAY AS NEEDED FOR ERECTILE DYSFUNCTION.   tirzepatide  (MOUNJARO ) 10 MG/0.5ML Pen Inject 10 mg into the skin every 7 (seven) days.   No facility-administered encounter medications on file as of 04/05/2024.    History: Past Medical History:  Diagnosis Date   Acute prostatitis 12/02/2009   Qualifier: Diagnosis of  By: Jimmy MD, Charlie Scarlet    Allergy    Hay fever   AV block    ED (erectile dysfunction)    sildenafil  100 mg sparingly   History of colonic polyps    Hyperlipidemia    Hypertension    S/P placement of cardiac pacemaker    Symptomatic bradycardia    Type 2 diabetes mellitus 2007   Past Surgical History:   Procedure Laterality Date   None     PACEMAKER IMPLANT N/A 08/06/2020   Procedure: PACEMAKER IMPLANT;  Surgeon: Cindie Ole DASEN, MD;  Location: [facility];  Service: Cardiovascular;  Laterality: N/A;   Family History  Problem Relation Age of Onset   Hypertension Mother    Stroke Mother        44, first stroke 18. never smoker   Diabetes Mother        in late life   Dementia Father    CAD Father        CABG mid 47s, never smoker   Hearing loss Father    Hypertension Sister    Diabetes Brother    Hypertension Brother    Prostate cancer Brother        30. died at 66   Cancer Brother    Pneumonia Maternal Grandfather        died from this   Diabetes Paternal Grandfather    Cerebral palsy Daughter    Cerebral palsy Daughter    Colon cancer Neg Hx    Esophageal cancer Neg Hx    Rectal cancer Neg Hx    Stomach cancer Neg Hx    Social History   Occupational History   Occupation: Retired  Tobacco Use   Smoking status: Never   Smokeless tobacco: Never  Vaping Use   Vaping status: Never Used  Substance and Sexual Activity   Alcohol use: Yes    Alcohol/week: 7.0 standard drinks of alcohol    Types:  5 Glasses of wine, 2 Cans of beer per week    Comment: one beer or wine daily   Drug use: Never   Sexual activity: Not Currently    Birth control/protection: None    Comment: wife only   Tobacco Counseling Counseling given: Not Answered  SDOH Screenings   Food Insecurity: No Food Insecurity (04/05/2024)  Housing: Unknown (04/05/2024)  Transportation Needs: No Transportation Needs (04/05/2024)  Utilities: Not At Risk (04/05/2024)  Alcohol Screen: Low Risk (02/23/2024)  Depression (PHQ2-9): Low Risk (04/05/2024)  Financial Resource Strain: Low Risk (02/23/2024)  Physical Activity: Sufficiently Active (04/05/2024)  Recent Concern: Physical Activity - Insufficiently Active (02/23/2024)  Social Connections: Moderately Isolated (04/05/2024)  Stress: No Stress  Concern Present (04/05/2024)  Tobacco Use: Low Risk (04/05/2024)  Health Literacy: Adequate Health Literacy (04/05/2024)   See flowsheets for full screening details  Depression Screen PHQ 2 & 9 Depression Scale- Over the past 2 weeks, how often have you been bothered by any of the following problems? Little interest or pleasure in doing things: 0 Feeling down, depressed, or hopeless (PHQ Adolescent also includes...irritable): 0 PHQ-2 Total Score: 0 Trouble falling or staying asleep, or sleeping too much: 0 Feeling tired or having little energy: 0 Poor appetite or overeating (PHQ Adolescent also includes...weight loss): 0 Feeling bad about yourself - or that you are a failure or have let yourself or your family down: 0 Trouble concentrating on things, such as reading the newspaper or watching television (PHQ Adolescent also includes...like school work): 0 Moving or speaking so slowly that other people could have noticed. Or the opposite - being so fidgety or restless that you have been moving around a lot more than usual: 0 Thoughts that you would be better off dead, or of hurting yourself in some way: 0 PHQ-9 Total Score: 0 If you checked off any problems, how difficult have these problems made it for you to do your work, take care of things at home, or get along with other people?: Not difficult at all     Goals Addressed               This Visit's Progress     lower a1C (pt-stated)        Lowering A1C              Objective:    Today's Vitals   04/05/24 0916  BP: 138/74  Weight: 162 lb (73.5 kg)  Height: 6' (1.829 m)   Body mass index is 21.97 kg/m.  Hearing/Vision screen Hearing Screening - Comments:: Pt denies any hearing issues  Vision Screening - Comments:: Wears rx glasses - up to date with routine eye exams with Dr Lita  Immunizations and Health Maintenance Health Maintenance  Topic Date Due   COVID-19 Vaccine (8 - 2025-26 season) 11/13/2024 (Originally  01/19/2024)   HEMOGLOBIN A1C  04/24/2024   OPHTHALMOLOGY EXAM  06/09/2024   Diabetic kidney evaluation - Urine ACR  01/11/2025   FOOT EXAM  01/11/2025   Diabetic kidney evaluation - eGFR measurement  02/26/2025   Medicare Annual Wellness (AWV)  04/05/2025   Colonoscopy  03/24/2026   Pneumococcal Vaccine: 50+ Years  Completed   Influenza Vaccine  Completed   Hepatitis C Screening  Completed   Zoster Vaccines- Shingrix  Completed   Meningococcal B Vaccine  Aged Out   DTaP/Tdap/Td  Discontinued        Assessment/Plan:  This is a routine wellness examination for Justin [name].  Patient Care Team: [place],  Garnette KIDD, MD as PCP - General (Family Medicine) Kate Lonni CROME, MD as PCP - Cardiology (Cardiology) Cindie Ole DASEN, MD as PCP - Electrophysiology (Cardiology)  I have personally reviewed and noted the following in the patients chart:   Medical and social history Use of alcohol, tobacco or illicit drugs  Current medications and supplements including opioid prescriptions. Functional ability and status Nutritional status Physical activity Advanced directives List of other physicians Hospitalizations, surgeries, and ER visits in previous 12 months Vitals Screenings to include cognitive, depression, and falls Referrals and appointments  No orders of the defined types were placed in this encounter.  In addition, I have reviewed and discussed with patient certain preventive protocols, quality metrics, and best practice recommendations. A written personalized care plan for preventive services as well as general preventive health recommendations were provided to patient.   Ellouise VEAR Haws, LPN   [id number]   Return in about 53 weeks (around 04/11/2025).  After Visit Summary: (MyChart) Due to this being a telephonic visit, the after visit summary with patients personalized plan was offered to patient via MyChart   Nurse Notes: No voiced or noted concerns at this time  "

## 2024-04-05 NOTE — Patient Instructions (Signed)
 Mr. Boys,  Thank you for taking the time for your Medicare Wellness Visit. I appreciate your continued commitment to your health goals. Please review the care plan we discussed, and feel free to reach out if I can assist you further.  Please note that Annual Wellness Visits do not include a physical exam. Some assessments may be limited, especially if the visit was conducted virtually. If needed, we may recommend an in-person follow-up with your provider.  Ongoing Care Seeing your primary care provider every 3 to 6 months helps us  monitor your health and provide consistent, personalized care.   Referrals If a referral was made during today's visit and you haven't received any updates within two weeks, please contact the referred provider directly to check on the status.  Recommended Screenings:  Health Maintenance  Topic Date Due   Medicare Annual Wellness Visit  07/04/2023   COVID-19 Vaccine (8 - 2025-26 season) 11/13/2024*   Hemoglobin A1C  04/24/2024   Eye exam for diabetics  06/09/2024   Yearly kidney health urinalysis for diabetes  01/11/2025   Complete foot exam   01/11/2025   Yearly kidney function blood test for diabetes  02/26/2025   Colon Cancer Screening  03/24/2026   Pneumococcal Vaccine for age over 48  Completed   Flu Shot  Completed   Hepatitis C Screening  Completed   Zoster (Shingles) Vaccine  Completed   Meningitis B Vaccine  Aged Out   DTaP/Tdap/Td vaccine  Discontinued  *Topic was postponed. The date shown is not the original due date.       07/04/2022    2:28 PM  Advanced Directives  Does Patient Have a Medical Advance Directive? No  Would patient like information on creating a medical advance directive? No - Patient declined    Vision: Annual vision screenings are recommended for early detection of glaucoma, cataracts, and diabetic retinopathy. These exams can also reveal signs of chronic conditions such as diabetes and high blood  pressure.  Dental: Annual dental screenings help detect early signs of oral cancer, gum disease, and other conditions linked to overall health, including heart disease and diabetes.  Please see the attached documents for additional preventive care recommendations.

## 2024-04-12 ENCOUNTER — Ambulatory Visit (HOSPITAL_BASED_OUTPATIENT_CLINIC_OR_DEPARTMENT_OTHER): Admitting: Cardiology

## 2024-04-12 ENCOUNTER — Encounter (HOSPITAL_BASED_OUTPATIENT_CLINIC_OR_DEPARTMENT_OTHER): Payer: Self-pay | Admitting: Cardiology

## 2024-04-12 VITALS — BP 112/56 | HR 83 | Ht 72.0 in | Wt 160.8 lb

## 2024-04-12 DIAGNOSIS — Z950 Presence of cardiac pacemaker: Secondary | ICD-10-CM

## 2024-04-12 DIAGNOSIS — I10 Essential (primary) hypertension: Secondary | ICD-10-CM

## 2024-04-12 DIAGNOSIS — E785 Hyperlipidemia, unspecified: Secondary | ICD-10-CM | POA: Diagnosis not present

## 2024-04-12 DIAGNOSIS — Z01810 Encounter for preprocedural cardiovascular examination: Secondary | ICD-10-CM

## 2024-04-12 NOTE — Progress Notes (Signed)
 " Cardiology Office Note:    Date:  04/12/2024   ID:  BEN HABERMANN, DOB January 03, 1956, MRN [id number]  PCP:  Katrinka Garnette KIDD, MD  Cardiologist:  Lonni LITTIE Nanas, MD  Electrophysiologist:  OLE ONEIDA HOLTS, MD   Referring MD: Katrinka Garnette KIDD, MD   Chief Complaint  Patient presents with   Pre-op Exam    History of Present Illness:    Justin [name] is a 68 y.o. 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  He was admitted from 4/10 through 07/25/2020 with chest pain and found to have intermittent 2-1 AV block with heart rates dropping to 40s.  Chest pain atypical, troponins negative.  EP was consulted and recommended holding off on pacemaker and obtaining outpatient monitor.  Monitor showed multiple episodes of Mobitz 2 AV block and high-grade AV block.  He reported worsening symptoms and he was admitted on 4/23 for PPM placement.  He underwent pacemaker placement was discharged on 4/24.  Echocardiogram 07/24/2020 showed LVEF 65 to 70%, grade 1 diastolic dysfunction, normal RV function, no significant valvular disease.  Since last clinic appointment, he reports he is doing well.  He has upcoming surgery on finger.  Denies any chest pain or dyspnea.  Reports he walks about 1 to 2 miles per day walking his dog.  Can walk up 2 flight of stairs without stopping.  He reports in September he visited a historical side in [place] and walked up 220 stairs, denied any symptoms with this.  Reports occasional lower extremity edema.  Denies any lightheadedness or syncope.   Past Medical History:  Diagnosis Date   Acute prostatitis 12/02/2009   Qualifier: Diagnosis of  By: Jimmy MD, Charlie Scarlet    Allergy    Hay fever   AV block    ED (erectile dysfunction)    sildenafil  100 mg sparingly   History of colonic polyps    Hyperlipidemia    Hypertension    S/P placement of cardiac  pacemaker    Symptomatic bradycardia    Type 2 diabetes mellitus 2007    Past Surgical History:  Procedure Laterality Date   None     PACEMAKER IMPLANT N/A 08/06/2020   Procedure: PACEMAKER IMPLANT;  Surgeon: Holts Ole ONEIDA, MD;  Location: [facility];  Service: Cardiovascular;  Laterality: N/A;    Current Medications: Current Meds  Medication Sig   acetaminophen  (TYLENOL ) 500 MG tablet Take 1,000 mg by mouth every 6 (six) hours as needed for mild pain.   amLODipine  (NORVASC ) 5 MG tablet TAKE 1 TABLET (5 MG TOTAL) BY MOUTH DAILY.   atorvastatin  (LIPITOR) 20 MG tablet TAKE 1 TABLET BY MOUTH EVERY DAY   benazepril  (LOTENSIN ) 20 MG tablet TAKE 1 TABLET BY MOUTH EVERY DAY   blood glucose meter kit and supplies KIT Dispense based on patient and insurance preference. Use up to four times daily as directed.   cetirizine (ZYRTEC) 10 MG tablet Take 10 mg by mouth daily as needed for allergies.   Empagliflozin -metFORMIN  HCl (SYNJARDY ) 12.08-998 MG TABS Take 1 tablet by mouth 2 (two) times daily with a meal   Finerenone  (KERENDIA ) 10 MG TABS Take 1 tablet (10 mg total) by mouth daily.   glucose blood (ACCU-CHEK GUIDE TEST) test strip Use as instructed to check blood sugar twice daily.   glucose blood (CONTOUR NEXT TEST) test strip Use as directed to check blood  glucose twice daily.   Multiple Vitamin (MULTIVITAMIN) capsule Take 1 capsule by mouth daily.   ONETOUCH DELICA LANCETS FINE MISC 1 each by Does not apply route 2 (two) times daily.   tadalafil  (CIALIS ) 20 MG tablet TAKE 1 TABLET (20 MG TOTAL) BY MOUTH EVERY OTHER DAY AS NEEDED FOR ERECTILE DYSFUNCTION.   tirzepatide  (MOUNJARO ) 10 MG/0.5ML Pen Inject 10 mg into the skin every 7 (seven) days.     Allergies:   Patient has no known allergies.   Social History   Socioeconomic History   Marital status: Married    Spouse name: Not on file   Number of children: Not on file   Years of education: Not on file   Highest education  level: Bachelor's degree (e.g., BA, AB, BS)  Occupational History   Occupation: Retired  Tobacco Use   Smoking status: Never   Smokeless tobacco: Never  Vaping Use   Vaping status: Never Used  Substance and Sexual Activity   Alcohol use: Yes    Alcohol/week: 7.0 standard drinks of alcohol    Types: 5 Glasses of wine, 2 Cans of beer per week    Comment: one beer or wine daily   Drug use: Never   Sexual activity: Not Currently    Birth control/protection: None    Comment: wife only  Other Topics Concern   Not on file  Social History Narrative   Married. Identical twin daughters 74 in 2019- mirror twins- born 35 weeks. Both daughters have CP and are wheelchair bound - both live in [place] - graduated top of their classes and have MBA from [employer]. 1 daughters married- they all live together.       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       Hobbies: travel, walks and hikes, skiing       Social Drivers of Health   Tobacco Use: Low Risk (04/12/2024)   Patient History    Smoking Tobacco Use: Never    Smokeless Tobacco Use: Never    Passive Exposure: Not on file  Financial Resource Strain: Low Risk (02/23/2024)   Overall Financial Resource Strain (CARDIA)    Difficulty of Paying Living Expenses: Not hard at all  Food Insecurity: No Food Insecurity (04/05/2024)   Epic    Worried About Radiation Protection Practitioner of Food in the Last Year: Never true    Ran Out of Food in the Last Year: Never true  Transportation Needs: No Transportation Needs (04/05/2024)   Epic    Lack of Transportation (Medical): No    Lack of Transportation (Non-Medical): No  Physical Activity: Sufficiently Active (04/05/2024)   Exercise Vital Sign    Days of Exercise per Week: 7 days    Minutes of Exercise per Session: 30 min  Recent Concern: Physical Activity - Insufficiently Active (02/23/2024)   Exercise Vital Sign    Days of Exercise per Week: 3 days    Minutes of Exercise per Session: 30 min   Stress: No Stress Concern Present (04/05/2024)   [employer] of Occupational Health - Occupational Stress Questionnaire    Feeling of Stress: Not at all  Social Connections: Moderately Isolated (04/05/2024)   Social Connection and Isolation Panel    Frequency of Communication with Friends and Family: More than three times a week    Frequency of Social Gatherings with Friends and Family: More than three times a week    Attends Religious Services: Never    Database Administrator or Organizations:  No    Attends Club or Organization Meetings: Never    Marital Status: Married  Depression (PHQ2-9): Low Risk (04/05/2024)   Depression (PHQ2-9)    PHQ-2 Score: 0  Alcohol Screen: Low Risk (02/23/2024)   Alcohol Screen    Last Alcohol Screening Score (AUDIT): 4  Housing: Low Risk (04/05/2024)   Epic    Unable to Pay for Housing in the Last Year: No    Number of Times Moved in the Last Year: 0    Homeless in the Last Year: No  Utilities: Not At Risk (04/05/2024)   Epic    Threatened with loss of utilities: No  Health Literacy: Adequate Health Literacy (04/05/2024)   B1300 Health Literacy    Frequency of need for help with medical instructions: Never     Family History: The patient's family history includes CAD in his father; Cancer in his brother; Cerebral palsy in his daughter and daughter; Dementia in his father; Diabetes in his brother, mother, and paternal grandfather; Hearing loss in his father; Hypertension in his brother, mother, and sister; Pneumonia in his maternal grandfather; Prostate cancer in his brother; Stroke in his mother. There is no history of Colon cancer, Esophageal cancer, Rectal cancer, or Stomach cancer.  ROS:   Please see the history of present illness.     All other systems reviewed and are negative.  EKGs/Labs/Other Studies Reviewed:    The following studies were reviewed today:   EKG:   04/12/2024: Atrial sensed, ventricular paced rhythm, rate  84  Recent Labs: 01/12/2024: Hemoglobin 14.8; Platelets 271.0 02/27/2024: ALT 14; BUN 26; Creat 0.90; Potassium 5.0; Sodium 138  Recent Lipid Panel    Component Value Date/Time   CHOL 121 01/12/2024 1434   TRIG 61.0 01/12/2024 1434   HDL 51.20 01/12/2024 1434   CHOLHDL 2 01/12/2024 1434   VLDL 12.2 01/12/2024 1434   LDLCALC 57 01/12/2024 1434   LDLDIRECT 52.0 07/14/2018 1425    Physical Exam:    VS:  BP (!) 112/56   Pulse 83   Ht 6' (1.829 m)   Wt 160 lb 12.8 oz (72.9 kg)   SpO2 98%   BMI 21.81 kg/m     Wt Readings from Last 3 Encounters:  04/12/24 160 lb 12.8 oz (72.9 kg)  04/05/24 162 lb (73.5 kg)  03/09/24 162 lb (73.5 kg)     GEN: Well nourished, well developed in no acute distress HEENT: Normal NECK: No JVD; No carotid bruits LYMPHATICS: No lymphadenopathy CARDIAC: RRR, no murmurs, rubs, gallops RESPIRATORY:  Clear to auscultation without rales, wheezing or rhonchi  ABDOMEN: Soft, non-tender, non-distended MUSCULOSKELETAL:  No edema; No deformity  SKIN: Warm and dry NEUROLOGIC:  Alert and oriented x 3 PSYCHIATRIC:  Normal affect   ASSESSMENT:    1. Preop cardiovascular exam   2. Primary hypertension   3. Hyperlipidemia, unspecified hyperlipidemia type   4. Cardiac pacemaker in situ     PLAN:    Preop evaluation: Prior to finger surgery.  Denies any anginal symptoms.  Excellent functional capacity.  Overall would classify as low risk from cardiac standpoint and no further workup recommended prior to surgery.  Second degree AV block/symptomatic bradycardia: Echocardiogram 07/24/2020 showed LVEF 65 to 70%, grade 1 diastolic dysfunction, normal RV function, no significant valvular disease. Status post PPM placement on 08/06/2020.  Follows with EP  Hypertension: On benazepril  20 mg daily and amlodipine  5mg  daily.  Appears controlled  T2DM: On Mounjaro .  A1c 7.1% on 02/2024  Hyperlipidemia:  On atorvastatin  20 mg daily.  LDL 57 on 12/2023  RTC in 1  year   Medication Adjustments/Labs and Tests Ordered: Current medicines are reviewed at length with the patient today.  Concerns regarding medicines are outlined above.  Orders Placed This Encounter  Procedures   EKG 12-Lead   No orders of the defined types were placed in this encounter.   Patient Instructions  Medication Instructions:  Your physician recommends that you continue on your current medications as directed. Please refer to the Current Medication list given to you today.  *If you need a refill on your cardiac medications before your next appointment, please call your pharmacy*  Lab Work: NONE  Testing/Procedures: NONE  Follow-Up: At [facility], you and your health needs are our priority.  As part of our continuing mission to provide you with exceptional heart care, we have created designated Provider Care Teams.  These Care Teams include your primary Cardiologist (physician) and Advanced Practice Providers (APPs -  Physician Assistants and Nurse Practitioners) who all work together to provide you with the care you need, when you need it.  We recommend signing up for the patient portal called MyChart.  Sign up information is provided on this After Visit Summary.  MyChart is used to connect with patients for Virtual Visits (Telemedicine).  Patients are able to view lab/test results, encounter notes, upcoming appointments, etc.  Non-urgent messages can be sent to your provider as well.   To learn more about what you can do with MyChart, go to [email or web address].    Your next appointment:   12 month(s)  The format for your next appointment:   In Person  Provider:   Lonni Nanas, MD       Signed, Lonni LITTIE Nanas, MD  04/12/2024 3:52 PM    [facility] "

## 2024-04-12 NOTE — Patient Instructions (Signed)
 Medication Instructions:  Your physician recommends that you continue on your current medications as directed. Please refer to the Current Medication list given to you today.  *If you need a refill on your cardiac medications before your next appointment, please call your pharmacy*  Lab Work: NONE  Testing/Procedures: NONE  Follow-Up: At [facility], you and your health needs are our priority.  As part of our continuing mission to provide you with exceptional heart care, we have created designated Provider Care Teams.  These Care Teams include your primary Cardiologist (physician) and Advanced Practice Providers (APPs -  Physician Assistants and Nurse Practitioners) who all work together to provide you with the care you need, when you need it.  We recommend signing up for the patient portal called "MyChart".  Sign up information is provided on this After Visit Summary.  MyChart is used to connect with patients for Virtual Visits (Telemedicine).  Patients are able to view lab/test results, encounter notes, upcoming appointments, etc.  Non-urgent messages can be sent to your provider as well.   To learn more about what you can do with MyChart, go to [email or web address].    Your next appointment:   12 month(s)  The format for your next appointment:   In Person  Provider:   Epifanio Lesches, MD

## 2024-04-20 ENCOUNTER — Ambulatory Visit

## 2024-04-22 ENCOUNTER — Other Ambulatory Visit (HOSPITAL_BASED_OUTPATIENT_CLINIC_OR_DEPARTMENT_OTHER): Payer: Self-pay

## 2024-04-22 MED ORDER — MOUNJARO 10 MG/0.5ML ~~LOC~~ SOAJ
10.0000 mg | SUBCUTANEOUS | 5 refills | Status: AC
  Filled 2024-04-22: qty 2, 28d supply, fill #0

## 2024-04-22 MED ORDER — SYNJARDY 12.5-1000 MG PO TABS
1.0000 | ORAL_TABLET | Freq: Two times a day (BID) | ORAL | 3 refills | Status: AC
  Filled 2024-04-22: qty 60, 30d supply, fill #0
  Filled 2024-05-16: qty 60, 30d supply, fill #1

## 2024-04-23 ENCOUNTER — Other Ambulatory Visit: Payer: Self-pay

## 2024-04-26 ENCOUNTER — Other Ambulatory Visit (HOSPITAL_BASED_OUTPATIENT_CLINIC_OR_DEPARTMENT_OTHER): Payer: Self-pay

## 2024-04-26 MED ORDER — HYDROCODONE-ACETAMINOPHEN 5-325 MG PO TABS
ORAL_TABLET | ORAL | 0 refills | Status: AC
  Filled 2024-04-26: qty 10, 2d supply, fill #0

## 2024-04-27 ENCOUNTER — Emergency Department (HOSPITAL_BASED_OUTPATIENT_CLINIC_OR_DEPARTMENT_OTHER)
Admission: EM | Admit: 2024-04-27 | Discharge: 2024-04-27 | Disposition: A | Discharge status: Home / Self Care | Attending: Emergency Medicine | Admitting: Emergency Medicine

## 2024-04-27 ENCOUNTER — Other Ambulatory Visit: Payer: Self-pay

## 2024-04-27 DIAGNOSIS — Z4801 Encounter for change or removal of surgical wound dressing: Secondary | ICD-10-CM | POA: Insufficient documentation

## 2024-04-27 DIAGNOSIS — Z5189 Encounter for other specified aftercare: Secondary | ICD-10-CM

## 2024-04-27 NOTE — ED Triage Notes (Signed)
 Reports needing bandage replaced on L middle finger. Had mallet surgery today and bandage fell off. Denies any symptoms.

## 2024-04-27 NOTE — ED Provider Notes (Signed)
 " [place] EMERGENCY DEPARTMENT AT [facility] Provider Note   CSN: [id number] Arrival date & time: 04/27/24  8171     Patient presents with: Wound Check   Justin [name] is a 69 y.o. male.    Wound Check     69 year old male presenting to the emergency department for a wound check.  The patient states that he had mallet finger surgery earlier today outpatient and his postoperative dressing fell off.  He denies any other concerns or complaints.  Prior to Admission medications  Medication Sig Start Date End Date Taking? Authorizing Provider  acetaminophen  (TYLENOL ) 500 MG tablet Take 1,000 mg by mouth every 6 (six) hours as needed for mild pain.    [provider]  amLODipine  (NORVASC ) 5 MG tablet TAKE 1 TABLET (5 MG TOTAL) BY MOUTH DAILY. 03/18/24   Goodrich, Callie E, PA-C  atorvastatin  (LIPITOR) 20 MG tablet TAKE 1 TABLET BY MOUTH EVERY DAY 03/31/24   Katrinka Garnette KIDD, MD  benazepril  (LOTENSIN ) 20 MG tablet TAKE 1 TABLET BY MOUTH EVERY DAY 05/12/23   Katrinka Garnette KIDD, MD  blood glucose meter kit and supplies KIT Dispense based on patient and insurance preference. Use up to four times daily as directed. 08/04/20   Katrinka Garnette KIDD, MD  cetirizine (ZYRTEC) 10 MG tablet Take 10 mg by mouth daily as needed for allergies.    [provider]  Empagliflozin -metFORMIN  HCl (SYNJARDY ) 12.08-998 MG TABS Take 1 tablet by mouth 2 (two) times daily. 04/22/24     Finerenone  (KERENDIA ) 10 MG TABS Take 1 tablet (10 mg total) by mouth daily. 01/27/24   Katrinka Garnette KIDD, MD  glucose blood (ACCU-CHEK GUIDE TEST) test strip Use as instructed to check blood sugar twice daily. 09/01/23   Katrinka Garnette KIDD, MD  glucose blood (CONTOUR NEXT TEST) test strip Use as directed to check blood glucose twice daily. 03/22/24     HYDROcodone -acetaminophen  (NORCO/VICODIN) 5-325 MG tablet Take 1 tablet by mouth every 6 hours 04/26/24     Multiple Vitamin (MULTIVITAMIN) capsule  Take 1 capsule by mouth daily.    [provider]  [facility] DELICA LANCETS FINE MISC 1 each by Does not apply route 2 (two) times daily. 05/04/12   Jame Maude FALCON, MD  tadalafil  (CIALIS ) 20 MG tablet TAKE 1 TABLET (20 MG TOTAL) BY MOUTH EVERY OTHER DAY AS NEEDED FOR ERECTILE DYSFUNCTION. 01/28/22   Katrinka Garnette KIDD, MD  tirzepatide  (MOUNJARO ) 10 MG/0.5ML Pen Inject 10 mg into the skin every 7 (seven) days. 04/22/24       Allergies: Patient has no known allergies.    Review of Systems  All other systems reviewed and are negative.   Updated Vital Signs BP 136/71 (BP Location: Right Arm)   Pulse 71   Temp 98 F (36.7 C)   Resp 14   SpO2 99%   Physical Exam Vitals and nursing note reviewed.  Constitutional:      General: He is not in acute distress. HENT:     Head: Normocephalic and atraumatic.  Eyes:     Conjunctiva/sclera: Conjunctivae normal.     Pupils: Pupils are equal, round, and reactive to light.  Cardiovascular:     Rate and Rhythm: Normal rate and regular rhythm.  Pulmonary:     Effort: Pulmonary effort is normal. No respiratory distress.  Abdominal:     General: There is no distension.     Tenderness: There is no guarding.  Musculoskeletal:  General: No deformity or signs of injury.     Cervical back: Neck supple.     Comments: Left middle finger with operative sutures in place, dried blood present  Skin:    Findings: No lesion or rash.  Neurological:     General: No focal deficit present.     Mental Status: He is alert. Mental status is at baseline.     (all labs ordered are listed, but only abnormal results are displayed) Labs Reviewed - No data to display  EKG: None  Radiology: No results found.   Procedures   Medications Ordered in the ED - No data to display                                  Medical Decision Making  69 year old male presenting to the emergency department for a wound check.  The patient states that he had  mallet finger surgery earlier today outpatient and his postoperative dressing fell off.  He denies any other concerns or complaints.  Arrival, the patient was vitally stable.  Presenting for wound care in the setting of his postop dressing falling off.  No other concerns or complaints.  Wound was redressed by nursing staff bedside with Xeroform and gauze and patient was advised to follow-up with his surgeon.     Final diagnoses:  Visit for wound check    ED Discharge Orders     None          Jerrol Agent, MD 04/27/24 1856  "

## 2024-05-03 ENCOUNTER — Ambulatory Visit: Payer: Medicare PPO

## 2024-05-03 DIAGNOSIS — I441 Atrioventricular block, second degree: Secondary | ICD-10-CM

## 2024-05-05 ENCOUNTER — Other Ambulatory Visit (HOSPITAL_BASED_OUTPATIENT_CLINIC_OR_DEPARTMENT_OTHER): Payer: Self-pay

## 2024-05-05 LAB — CUP PACEART REMOTE DEVICE CHECK
Battery Remaining Longevity: 106 mo
Battery Voltage: 2.99 V
Brady Statistic AP VP Percent: 0.01 %
Brady Statistic AP VS Percent: 0 %
Brady Statistic AS VP Percent: 99.97 %
Brady Statistic AS VS Percent: 0.02 %
Brady Statistic RA Percent Paced: 0.01 %
Brady Statistic RV Percent Paced: 99.98 %
Date Time Interrogation Session: 20260119094602
Implantable Lead Connection Status: 753985
Implantable Lead Connection Status: 753985
Implantable Lead Implant Date: 20220424
Implantable Lead Implant Date: 20220424
Implantable Lead Location: 753859
Implantable Lead Location: 753860
Implantable Lead Model: 3830
Implantable Lead Model: 5076
Implantable Pulse Generator Implant Date: 20220424
Lead Channel Impedance Value: 323 Ohm
Lead Channel Impedance Value: 361 Ohm
Lead Channel Impedance Value: 399 Ohm
Lead Channel Impedance Value: 494 Ohm
Lead Channel Pacing Threshold Amplitude: 0.875 V
Lead Channel Pacing Threshold Amplitude: 0.875 V
Lead Channel Pacing Threshold Pulse Width: 0.4 ms
Lead Channel Pacing Threshold Pulse Width: 0.4 ms
Lead Channel Sensing Intrinsic Amplitude: 2.875 mV
Lead Channel Sensing Intrinsic Amplitude: 2.875 mV
Lead Channel Sensing Intrinsic Amplitude: 4.875 mV
Lead Channel Sensing Intrinsic Amplitude: 4.875 mV
Lead Channel Setting Pacing Amplitude: 1.75 V
Lead Channel Setting Pacing Amplitude: 2 V
Lead Channel Setting Pacing Pulse Width: 0.4 ms
Lead Channel Setting Sensing Sensitivity: 2 mV
Zone Setting Status: 755011

## 2024-05-05 MED ORDER — MOUNJARO 12.5 MG/0.5ML ~~LOC~~ SOAJ
12.5000 mg | SUBCUTANEOUS | 3 refills | Status: AC
  Filled 2024-05-05: qty 2, 28d supply, fill #0
  Filled 2024-05-16: qty 2, 28d supply, fill #1
  Filled ????-??-??: fill #1

## 2024-05-07 NOTE — Progress Notes (Signed)
 Remote PPM Transmission

## 2024-05-09 ENCOUNTER — Ambulatory Visit: Payer: Self-pay | Admitting: Cardiology

## 2024-05-16 ENCOUNTER — Other Ambulatory Visit (HOSPITAL_BASED_OUTPATIENT_CLINIC_OR_DEPARTMENT_OTHER): Payer: Self-pay

## 2024-05-16 ENCOUNTER — Other Ambulatory Visit: Payer: Self-pay

## 2024-05-18 ENCOUNTER — Other Ambulatory Visit (HOSPITAL_BASED_OUTPATIENT_CLINIC_OR_DEPARTMENT_OTHER): Payer: Self-pay

## 2024-05-19 ENCOUNTER — Encounter: Payer: Self-pay | Admitting: Podiatry

## 2024-05-19 ENCOUNTER — Ambulatory Visit: Admitting: Podiatry

## 2024-05-19 DIAGNOSIS — L84 Corns and callosities: Secondary | ICD-10-CM

## 2024-05-19 DIAGNOSIS — S90852A Superficial foreign body, left foot, initial encounter: Secondary | ICD-10-CM

## 2024-05-19 DIAGNOSIS — E119 Type 2 diabetes mellitus without complications: Secondary | ICD-10-CM

## 2024-05-19 NOTE — Progress Notes (Signed)
 "  Subjective:  Patient ID: Justin [name], male    DOB: 1955-05-05,   MRN: [id number]  Chief Complaint  Patient presents with   Diabetes    Do the usual.  I think I have corn or wart on the bottom of my left big toe.  Saw Harlene Bill, FNP - 05/05/2024; A1c - 7.1    69 y.o. male presents for concern of thickened and painful calluses. Requesting to have them trimmed today. Relates burning and tingling in their feet. Patient is diabetic and last A1c was  Lab Results  Component Value Date   HGBA1C 7.1 10/23/2023   .   Does relate new concern of wart or corn on the bottom of his left big toe.  He does not recall stepping on anything.  He relates not much pain with it.  Not sure what is going on.  Denies any drainage redness or swelling  PCP:  Katrinka Garnette KIDD, MD    . Denies any other pedal complaints. Denies n/v/f/c.   Past Medical History:  Diagnosis Date   Acute prostatitis 12/02/2009   Qualifier: Diagnosis of  By: Jimmy MD, Charlie Scarlet    Allergy    Hay fever   AV block    ED (erectile dysfunction)    sildenafil  100 mg sparingly   History of colonic polyps    Hyperlipidemia    Hypertension    S/P placement of cardiac pacemaker    Symptomatic bradycardia    Type 2 diabetes mellitus 2007    Objective:  Physical Exam: Vascular: DP/PT pulses 2/4 bilateral. CFT <3 seconds. Absent hair growth on digits. Edema noted to bilateral lower extremities. Xerosis noted bilaterally.  Skin. No lacerations or abrasions bilateral feet. Nails 1-5 bilateral  are normal in appearance. There is hyperkeratosis noted  sub first and fifth metatarsal bilateral and lateral fourth digit on the right.  Left hallux plantar lesion noted upon debridement about half a centimeter foreign body noted appears to be a form of some sort. Musculoskeletal: MMT 5/5 bilateral lower extremities in DF, PF, Inversion and Eversion. Deceased ROM in DF of ankle joint.  Neurological: Sensation intact to light touch.  Protective sensation diminished bilateral.     Assessment:   1. Pre-ulcerative calluses   2. Diabetes mellitus without complication (HCC)   3. Foreign body in left foot, initial encounter       Plan:  Patient was evaluated and treated and all questions answered. -Discussed and educated patient on diabetic foot care, especially with  regards to the vascular, neurological and musculoskeletal systems.  -Stressed the importance of good glycemic control and the detriment of not  controlling glucose levels in relation to the foot. -Discussed supportive shoes at all times and checking feet regularly.  -Mechanically debrided hyperkeratotic tissue x 5 without incident with chisel.  -Answered all patient questions -Discussed foreign body of left great toe with patient.  And treatment options. -Debridement of foreign body as below. -Advised to keep Neosporin and a Band-Aid on the area for the next few days until healed. -Advised if any redness swelling pain or drainage present to notify me. -Patient to return  in 3 months for at risk foot care -Patient advised to call the office if any problems or questions arise in the meantime.     Excision of foreign body   Indication/Diagnosis: Foreign body plantar left hallux Response to prior treatment: stable    Procedure: Verbal consent given by patient, and correct patient, procedure, and  site were identified prior to beginning the procedure.  Type: Sharp and Surgical Tool Used: scalpel Debridement of the plantar left hallux lesion with removal of superficial hyperkeratosis 0.5 cm wooden foreign was noted and removed in total.  Debridement continued and no obvious signs of foreign body remained.  Small puncture wound noted to plantar left foot irrigated with normal saline and toe covered with Neosporin and bandage.  Discussed aftercare. Irrigation fluid type: Normal Saline Irrigation volume (mL): 5 ml   Blood Loss:  minimal       Asberry Failing, DPM      "

## 2024-06-14 ENCOUNTER — Ambulatory Visit: Admitting: Student

## 2024-08-02 ENCOUNTER — Ambulatory Visit: Payer: Medicare PPO

## 2024-08-18 ENCOUNTER — Ambulatory Visit: Admitting: Podiatry

## 2024-08-26 ENCOUNTER — Ambulatory Visit: Admitting: Family Medicine

## 2024-11-01 ENCOUNTER — Ambulatory Visit: Payer: Medicare PPO

## 2025-01-13 ENCOUNTER — Encounter: Admitting: Family Medicine

## 2025-01-31 ENCOUNTER — Ambulatory Visit: Payer: Medicare PPO

## 2025-04-11 ENCOUNTER — Ambulatory Visit

## 2025-05-02 ENCOUNTER — Ambulatory Visit: Payer: Medicare PPO
